# Patient Record
Sex: Female | Born: 1965
Health system: Southern US, Community
[De-identification: ages and names within clinical notes are randomized; demographics above are authoritative.]

## PROBLEM LIST (undated history)

## (undated) DIAGNOSIS — I10 Essential (primary) hypertension: Secondary | ICD-10-CM

## (undated) DIAGNOSIS — M1990 Unspecified osteoarthritis, unspecified site: Secondary | ICD-10-CM

## (undated) DIAGNOSIS — I739 Peripheral vascular disease, unspecified: Secondary | ICD-10-CM

## (undated) DIAGNOSIS — R7303 Prediabetes: Secondary | ICD-10-CM

## (undated) HISTORY — DX: Essential (primary) hypertension: I10

## (undated) HISTORY — PX: OTHER SURGICAL HISTORY: SHX169

## (undated) HISTORY — PX: TUBAL LIGATION: SHX77

## (undated) HISTORY — PX: CHOLECYSTECTOMY: SHX55

---

## 2000-10-04 ENCOUNTER — Emergency Department (HOSPITAL_COMMUNITY): Admission: EM | Admit: 2000-10-04 | Discharge: 2000-10-04 | Payer: Self-pay | Admitting: Emergency Medicine

## 2004-01-02 ENCOUNTER — Ambulatory Visit (HOSPITAL_COMMUNITY): Admission: RE | Admit: 2004-01-02 | Discharge: 2004-01-02 | Payer: Self-pay | Admitting: Family Medicine

## 2004-04-12 ENCOUNTER — Ambulatory Visit (HOSPITAL_COMMUNITY): Admission: RE | Admit: 2004-04-12 | Discharge: 2004-04-12 | Payer: Self-pay | Admitting: Orthopaedic Surgery

## 2005-01-20 ENCOUNTER — Ambulatory Visit: Payer: Self-pay | Admitting: Family Medicine

## 2005-08-22 ENCOUNTER — Ambulatory Visit: Payer: Self-pay | Admitting: Family Medicine

## 2005-09-10 ENCOUNTER — Ambulatory Visit (HOSPITAL_COMMUNITY): Admission: RE | Admit: 2005-09-10 | Discharge: 2005-09-10 | Payer: Self-pay | Admitting: Family Medicine

## 2005-09-29 ENCOUNTER — Other Ambulatory Visit: Admission: RE | Admit: 2005-09-29 | Discharge: 2005-09-29 | Payer: Self-pay | Admitting: Family Medicine

## 2005-09-29 ENCOUNTER — Ambulatory Visit: Payer: Self-pay | Admitting: Family Medicine

## 2005-09-29 LAB — CONVERTED CEMR LAB: Pap Smear: NORMAL

## 2005-10-08 ENCOUNTER — Ambulatory Visit (HOSPITAL_COMMUNITY): Admission: RE | Admit: 2005-10-08 | Discharge: 2005-10-08 | Payer: Self-pay | Admitting: Family Medicine

## 2006-05-11 ENCOUNTER — Ambulatory Visit: Payer: Self-pay | Admitting: Family Medicine

## 2006-06-23 ENCOUNTER — Ambulatory Visit: Payer: Self-pay | Admitting: Family Medicine

## 2006-07-15 ENCOUNTER — Ambulatory Visit (HOSPITAL_COMMUNITY): Admission: RE | Admit: 2006-07-15 | Discharge: 2006-07-15 | Payer: Self-pay | Admitting: Family Medicine

## 2007-02-22 ENCOUNTER — Emergency Department (HOSPITAL_COMMUNITY): Admission: EM | Admit: 2007-02-22 | Discharge: 2007-02-22 | Payer: Self-pay | Admitting: Emergency Medicine

## 2007-04-21 ENCOUNTER — Ambulatory Visit: Payer: Self-pay | Admitting: Family Medicine

## 2007-08-10 ENCOUNTER — Ambulatory Visit (HOSPITAL_COMMUNITY): Admission: RE | Admit: 2007-08-10 | Discharge: 2007-08-10 | Payer: Self-pay | Admitting: Family Medicine

## 2007-08-10 ENCOUNTER — Ambulatory Visit: Payer: Self-pay | Admitting: Vascular Surgery

## 2007-08-23 ENCOUNTER — Ambulatory Visit: Payer: Self-pay | Admitting: Vascular Surgery

## 2007-09-01 ENCOUNTER — Ambulatory Visit: Payer: Self-pay | Admitting: Vascular Surgery

## 2007-10-04 ENCOUNTER — Ambulatory Visit: Payer: Self-pay | Admitting: Vascular Surgery

## 2007-10-12 ENCOUNTER — Ambulatory Visit: Payer: Self-pay | Admitting: Vascular Surgery

## 2007-11-12 ENCOUNTER — Ambulatory Visit: Payer: Self-pay | Admitting: Family Medicine

## 2007-11-29 ENCOUNTER — Encounter: Payer: Self-pay | Admitting: Family Medicine

## 2007-11-29 LAB — CONVERTED CEMR LAB
BUN: 11 mg/dL (ref 6–23)
Basophils Absolute: 0 10*3/uL (ref 0.0–0.1)
Basophils Relative: 0 % (ref 0–1)
CO2: 22 meq/L (ref 19–32)
Calcium: 9.6 mg/dL (ref 8.4–10.5)
Chloride: 104 meq/L (ref 96–112)
Cholesterol: 187 mg/dL (ref 0–200)
Creatinine, Ser: 0.84 mg/dL (ref 0.40–1.20)
Eosinophils Absolute: 0.2 10*3/uL (ref 0.0–0.7)
Eosinophils Relative: 4 % (ref 0–5)
Glucose, Bld: 80 mg/dL (ref 70–99)
HCT: 37.4 % (ref 36.0–46.0)
HDL: 57 mg/dL (ref >39)
Hemoglobin: 11.5 g/dL — ABNORMAL LOW (ref 12.0–15.0)
LDL Cholesterol: 116 mg/dL — ABNORMAL HIGH (ref 0–99)
Lymphocytes Relative: 38 % (ref 12–46)
Lymphs Abs: 1.9 10*3/uL (ref 0.7–4.0)
MCHC: 30.7 g/dL (ref 30.0–36.0)
MCV: 87.4 fL (ref 78.0–100.0)
Monocytes Absolute: 0.3 10*3/uL (ref 0.1–1.0)
Monocytes Relative: 7 % (ref 3–12)
Neutro Abs: 2.5 10*3/uL (ref 1.7–7.7)
Neutrophils Relative %: 50 % (ref 43–77)
Platelets: 357 10*3/uL (ref 150–400)
Potassium: 4 meq/L (ref 3.5–5.3)
RBC: 4.28 M/uL (ref 3.87–5.11)
RDW: 14.3 % (ref 11.5–15.5)
Sodium: 138 meq/L (ref 135–145)
TSH: 1.002 microintl units/mL (ref 0.350–5.50)
Total CHOL/HDL Ratio: 3.3
Triglycerides: 72 mg/dL (ref <150)
VLDL: 14 mg/dL (ref 0–40)
WBC: 5 10*3/uL (ref 4.0–10.5)

## 2007-12-02 ENCOUNTER — Ambulatory Visit: Payer: Self-pay | Admitting: Family Medicine

## 2007-12-02 ENCOUNTER — Encounter: Payer: Self-pay | Admitting: Family Medicine

## 2007-12-02 ENCOUNTER — Other Ambulatory Visit: Admission: RE | Admit: 2007-12-02 | Discharge: 2007-12-02 | Payer: Self-pay | Admitting: Family Medicine

## 2007-12-03 ENCOUNTER — Encounter: Payer: Self-pay | Admitting: Family Medicine

## 2007-12-03 LAB — CONVERTED CEMR LAB
Candida species: NEGATIVE
Chlamydia, DNA Probe: NEGATIVE
GC Probe Amp, Genital: NEGATIVE
Gardnerella vaginalis: NEGATIVE
Trichomonal Vaginitis: NEGATIVE

## 2007-12-06 ENCOUNTER — Ambulatory Visit (HOSPITAL_COMMUNITY): Admission: RE | Admit: 2007-12-06 | Discharge: 2007-12-06 | Payer: Self-pay | Admitting: Family Medicine

## 2007-12-08 ENCOUNTER — Encounter: Payer: Self-pay | Admitting: Family Medicine

## 2007-12-08 LAB — CONVERTED CEMR LAB: Pap Smear: NORMAL

## 2008-02-01 ENCOUNTER — Encounter: Payer: Self-pay | Admitting: Family Medicine

## 2008-02-01 DIAGNOSIS — I10 Essential (primary) hypertension: Secondary | ICD-10-CM | POA: Insufficient documentation

## 2008-02-01 DIAGNOSIS — L259 Unspecified contact dermatitis, unspecified cause: Secondary | ICD-10-CM | POA: Insufficient documentation

## 2008-03-29 ENCOUNTER — Ambulatory Visit: Payer: Self-pay | Admitting: Family Medicine

## 2008-03-31 ENCOUNTER — Encounter: Payer: Self-pay | Admitting: Family Medicine

## 2008-06-29 ENCOUNTER — Telehealth: Payer: Self-pay | Admitting: Family Medicine

## 2008-07-03 ENCOUNTER — Ambulatory Visit: Payer: Self-pay | Admitting: Family Medicine

## 2008-11-21 ENCOUNTER — Ambulatory Visit: Payer: Self-pay | Admitting: Family Medicine

## 2008-11-21 DIAGNOSIS — J309 Allergic rhinitis, unspecified: Secondary | ICD-10-CM | POA: Insufficient documentation

## 2008-11-21 DIAGNOSIS — J029 Acute pharyngitis, unspecified: Secondary | ICD-10-CM | POA: Insufficient documentation

## 2009-07-14 ENCOUNTER — Encounter (INDEPENDENT_AMBULATORY_CARE_PROVIDER_SITE_OTHER): Payer: Self-pay | Admitting: *Deleted

## 2009-08-02 ENCOUNTER — Encounter: Payer: Self-pay | Admitting: Physician Assistant

## 2009-08-02 ENCOUNTER — Ambulatory Visit: Payer: Self-pay | Admitting: Family Medicine

## 2009-08-02 DIAGNOSIS — D1800 Hemangioma unspecified site: Secondary | ICD-10-CM | POA: Insufficient documentation

## 2009-08-02 DIAGNOSIS — M179 Osteoarthritis of knee, unspecified: Secondary | ICD-10-CM | POA: Insufficient documentation

## 2009-08-02 DIAGNOSIS — M1710 Unilateral primary osteoarthritis, unspecified knee: Secondary | ICD-10-CM | POA: Insufficient documentation

## 2009-08-02 DIAGNOSIS — D239 Other benign neoplasm of skin, unspecified: Secondary | ICD-10-CM | POA: Insufficient documentation

## 2009-08-03 ENCOUNTER — Encounter: Payer: Self-pay | Admitting: Physician Assistant

## 2009-08-03 LAB — CONVERTED CEMR LAB
BUN: 11 mg/dL (ref 6–23)
CO2: 29 meq/L (ref 19–32)
Calcium: 9.5 mg/dL (ref 8.4–10.5)
Chloride: 100 meq/L (ref 96–112)
Cholesterol: 172 mg/dL (ref 0–200)
Creatinine, Ser: 0.75 mg/dL (ref 0.40–1.20)
Glucose, Bld: 86 mg/dL (ref 70–99)
HCT: 39 % (ref 36.0–46.0)
HDL: 59 mg/dL (ref >39)
Hemoglobin: 12.3 g/dL (ref 12.0–15.0)
LDL Cholesterol: 102 mg/dL — ABNORMAL HIGH (ref 0–99)
MCHC: 31.5 g/dL (ref 30.0–36.0)
MCV: 90.5 fL (ref 78.0–100.0)
Platelets: 337 10*3/uL (ref 150–400)
Potassium: 4.2 meq/L (ref 3.5–5.3)
RBC: 4.31 M/uL (ref 3.87–5.11)
RDW: 14 % (ref 11.5–15.5)
Sodium: 137 meq/L (ref 135–145)
TSH: 1.036 microintl units/mL (ref 0.350–4.500)
Total CHOL/HDL Ratio: 2.9
Triglycerides: 56 mg/dL (ref <150)
VLDL: 11 mg/dL (ref 0–40)
Vit D, 25-Hydroxy: 26 ng/mL — ABNORMAL LOW (ref 30–89)
WBC: 4.5 10*3/uL (ref 4.0–10.5)

## 2009-09-26 ENCOUNTER — Ambulatory Visit: Payer: Self-pay | Admitting: Family Medicine

## 2009-09-28 ENCOUNTER — Encounter: Payer: Self-pay | Admitting: Family Medicine

## 2009-10-04 ENCOUNTER — Ambulatory Visit: Payer: Self-pay | Admitting: Family Medicine

## 2009-10-04 ENCOUNTER — Other Ambulatory Visit: Admission: RE | Admit: 2009-10-04 | Discharge: 2009-10-04 | Payer: Self-pay | Admitting: Family Medicine

## 2009-10-04 DIAGNOSIS — R109 Unspecified abdominal pain: Secondary | ICD-10-CM | POA: Insufficient documentation

## 2009-10-04 LAB — CONVERTED CEMR LAB
OCCULT 1: NEGATIVE
Pap Smear: NEGATIVE

## 2009-10-15 ENCOUNTER — Ambulatory Visit: Payer: Self-pay | Admitting: Family Medicine

## 2009-10-15 DIAGNOSIS — N920 Excessive and frequent menstruation with regular cycle: Secondary | ICD-10-CM | POA: Insufficient documentation

## 2009-10-15 LAB — CONVERTED CEMR LAB
Beta hcg, urine, semiquantitative: NEGATIVE
Bilirubin Urine: NEGATIVE
Blood in Urine, dipstick: NEGATIVE
Glucose, Urine, Semiquant: NEGATIVE
Ketones, urine, test strip: NEGATIVE
Nitrite: NEGATIVE
Protein, U semiquant: NEGATIVE
Specific Gravity, Urine: 1.02
Urobilinogen, UA: 0.2
WBC Urine, dipstick: NEGATIVE
pH: 7

## 2010-03-27 ENCOUNTER — Ambulatory Visit: Payer: Self-pay | Admitting: Family Medicine

## 2010-03-27 DIAGNOSIS — J019 Acute sinusitis, unspecified: Secondary | ICD-10-CM | POA: Insufficient documentation

## 2010-03-27 DIAGNOSIS — J018 Other acute sinusitis: Secondary | ICD-10-CM | POA: Insufficient documentation

## 2010-03-29 ENCOUNTER — Encounter: Payer: Self-pay | Admitting: Family Medicine

## 2010-03-29 ENCOUNTER — Telehealth: Payer: Self-pay | Admitting: Family Medicine

## 2010-04-08 DIAGNOSIS — J209 Acute bronchitis, unspecified: Secondary | ICD-10-CM | POA: Insufficient documentation

## 2010-06-23 ENCOUNTER — Encounter: Payer: Self-pay | Admitting: Family Medicine

## 2010-07-02 NOTE — Assessment & Plan Note (Signed)
Summary: TB  Nurse Visit   Vital Signs:  Patient profile:   45 year old female Menstrual status:  regular Height:      65.5 inches Weight:      291 pounds BMI:     47.86 O2 Sat:      98 % Pulse rate:   100 / minute Resp:     16 per minute  Vitals Entered By: Everitt Amber LPN (September 26, 2009 11:38 AM) CC: Patient here to have TB injection    Allergies: No Known Drug Allergies  Orders Added: 1)  TB Skin Test [86580] 2)  Admin 1st Vaccine [90471] 3)  Admin 1st Vaccine (State) [90471S]   PPD Application    Vaccine Type: PPD    Site: right forearm    Mfr: [employer] Pasteur    Dose: 0.1 ml    Route: ID    Given by: Everitt Amber LPN    Exp. Date: 03/2011    Lot #: [id number]  noted  Appended Document: TB   PPD Results    Date of reading: 09/28/2009    Results: < 5mm    Interpretation: negative

## 2010-07-02 NOTE — Assessment & Plan Note (Signed)
Summary: follow up - room 3   Vital Signs:  Patient profile:   45 year old female Menstrual status:  regular Height:      65.5 inches Weight:      285.25 pounds BMI:     46.92 O2 Sat:      98 % on Room air Pulse rate:   89 / minute Resp:     16 per minute BP sitting:   126 / 80  (left arm)  Vitals Entered By: Adella Hare LPN (August 02, 1608 9:02 AM)  Nutrition Counseling: Patient's BMI is greater than 25 and therefore counseled on weight management options. CC: follow up Is Patient Diabetic? No Pain Assessment Patient in pain? no        CC:  follow up.  History of Present Illness: Pt is here today with a few concerns.  She would like a referral to Derm for moles &skin discoloration on her LE.  The skin discoloration started after her Varicose vein surgery.  Has faded some.  Surgeon told her there was nothing she could do about it.  She wonders if the area can be cut out.  She also has some new red moles.  And has a mole on her nose that Dr Margo Aye had removed before & it has come back.  Would like it removeda again.  pt also c/o her knees hurting with walking & bulge Rt knee. No trauma.  She also has some grinding noises with walking.  The pain is only when she walks for exercise & not with walking with daily activity.  No swelling.  She has seen Dr Milana Na in the past for her knees.  Rf BP meds.  not taking K+ because they are too large.  No probs with BP pills.  Her BP has been well controlled. Pt would like to sched physical & pap in the near future.  she is currently unemployed & wants to get it done before she returns to work.  Current Medications (verified): 1)  Lisinopril-Hydrochlorothiazide 20-25 Mg Tabs (Lisinopril-Hydrochlorothiazide) .... One and Half Tabs By Mouth Qd 2)  Phentermine Hcl 37.5 Mg Tabs (Phentermine Hcl) .... One Tab By Mouth Once Daily 3)  Penicillin V Potassium 500 Mg Tabs (Penicillin V Potassium) .... Take 1 Tablet By Mouth Three Times A  Day  Allergies (verified): No Known Drug Allergies  Past History:  Past medical, surgical, family and social histories (including risk factors) reviewed for relevance to current acute and chronic problems.  Past Medical History: Reviewed history from 02/01/2008 and no changes required. * Sx of LEFT INGUINAL STRAIN DERMATITIS (ICD-692.9) OBESITY (ICD-278.00) HYPERTENSION (ICD-401.9)  Past Surgical History: Reviewed history from 02/01/2008 and no changes required. Cholecystectomy (9604) Caesarean section (1989) Caesarean section (1996) Tubal ligation (1996) Vascular surgery left and right leg (2009)  Family History: Reviewed history from 02/01/2008 and no changes required. TWO CHILDREN MOTHER  LIVING  HEALTHY FATHER  DECEASED  CANCER AND GI TRACT FOUR SIBLINGS  LIVING  THREE ARE HYPERTENSIVE ONE BROTHER DECEASED  METASTATIC PANCREATIC CANCER  Social History: Reviewed history from 03/29/2008 and no changes required. Employed Married Never Smoked Alcohol use-no Drug use-no  Review of Systems General:  Denies chills and fever. CV:  Denies chest pain or discomfort, shortness of breath with exertion, and swelling of feet. Resp:  Denies cough and shortness of breath. MS:  Complains of joint pain; denies joint redness, joint swelling, low back pain, and cramps. Neuro:  Denies numbness and tingling.  Physical Exam  General:  Well-developed,well-nourished,in no acute distress; alert,appropriate and cooperative throughout examination Head:  Normocephalic and atraumatic without obvious abnormalities. No apparent alopecia or balding. Lungs:  Normal respiratory effort, chest expands symmetrically. Lungs are clear to auscultation, no crackles or wheezes. Heart:  Normal rate and regular rhythm. S1 and S2 normal without gallop, murmur, click, rub or other extra sounds. Msk:  Bilat knees -no joint tenderness, no joint swelling, and no joint warmth.  No ligament instabilty.  Pt dos  have some mild crepitus bilat patella.  Rt anterior knee, medial & inferior to patella there is a more prominent nodule compared to the Lt.  this feels like it may be a fat pad. Pulses:  R posterior tibial normal, R dorsalis pedis normal, L posterior tibial normal, and L dorsalis pedis normal.   Extremities:  No clubbing, cyanosis, edema, or deformity noted with normal full range of motion of all joints.   Neurologic:  alert & oriented X3, strength normal in all extremities, sensation intact to light touch, and gait normal.   Skin:  Intact without suspicious lesions or rashes.  Nevus noted on nose approx 2-3 mm.  Hemangiomas noted on thorax.  Hyperpigmentation bilat medial ankle areas also note. no rashes, no ecchymoses, and no ulcerations.   Psych:  Cognition and judgment appear intact. Alert and cooperative with normal attention span and concentration. No apparent delusions, illusions, hallucinations   Impression & Recommendations:  Problem # 1:  HYPERTENSION (ICD-401.9) Assessment Unchanged  Her updated medication list for this problem includes:    Lisinopril-hydrochlorothiazide 20-25 Mg Tabs (Lisinopril-hydrochlorothiazide) ..... One and half tabs by mouth qd  BP today: 126/80 Prior BP: 118/76 (11/21/2008)  Labs Reviewed: K+: 4.0 (11/29/2007) Creat: : 0.84 (11/29/2007)   Chol: 187 (11/29/2007)   HDL: 57 (11/29/2007)   LDL: 116 (11/29/2007)   TG: 72 (11/29/2007)  Orders: T-Basic Metabolic Panel [phone]) T-CBC No Diff ([id number])  Problem # 2:  KNEE PAIN, BILATERAL (ICD-719.46) Assessment: New Pt started Glucosamine & chrondroitin. She feels like this is helping some.  Advised she may cont.  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  discussed that these are common with age & are  benign.  Problem # 5:  OBESITY (ICD-278.00) Assessment: Deteriorated Pt requested refill phenteramine. Has taken prev.  Will auth 1 mos at this time.   Orders: T-Lipid Profile ([id number]) T-TSH [phone])  Ht: 65.5 (08/02/2009)   Wt: 285.25 (08/02/2009)   BMI: 46.92 (08/02/2009)  Complete Medication List: 1)  Lisinopril-hydrochlorothiazide 20-25 Mg Tabs (Lisinopril-hydrochlorothiazide) .... One and half tabs by mouth qd 2)  Phentermine Hcl 37.5 Mg Tabs (Phentermine hcl) .... One tab by mouth once daily 3)  Penicillin V Potassium 500 Mg Tabs (Penicillin v potassium) .... Take 1 tablet by mouth three times a day  Other Orders: T-Vitamin D (25-Hydroxy) ([id number]) Mammogram (Screening) (Mammo)  Patient Instructions: 1)  Schedule a physical/pap appt with Dr Lodema Hong in 1-2 mos. 2)  I have referred you to the Dermatologist & Orthopedic Dr as discussed. 3)  I have ordered blood work work.  Please have done fasting. 4)  I have ordered a mammogram. 5)  It is important that you exercise regularly at least 20 minutes 5 times a week. If you develop chest pain, have severe difficulty breathing, or feel very tired , stop exercising immediately and seek medical attention. 6)  You need to lose weight. Consider a lower calorie diet and regular exercise.  Prescriptions: PHENTERMINE HCL 37.5 MG TABS (PHENTERMINE HCL) One tab by mouth once daily  #30 x 0   Entered and Authorized by:   Esperanza Sheets PA   Signed by:   Esperanza Sheets PA on 08/02/2009   Method used:   Printed then faxed to ...       [facility] (retail)       [address]       [place], [place]  [address]       Ph: [phone]       Fax: [phone]   RxID:   [id number] LISINOPRIL-HYDROCHLOROTHIAZIDE 20-25 MG TABS (LISINOPRIL-HYDROCHLOROTHIAZIDE) one and half tabs by mouth qd  #60 Each x 5   Entered and Authorized by:   Esperanza Sheets PA   Signed by:   Esperanza Sheets PA on 08/02/2009   Method used:    Printed then faxed to ...       [facility] (retail)       [address]       [place], [place]  [address]       Ph: [phone]       Fax: [phone]   RxID:   [id number]

## 2010-07-02 NOTE — Letter (Signed)
Summary: Laboratory/X-Ray Results  [facility]  [address]   [place], [place] [address]   Phone: [phone]  Fax: [phone]    Lab/X-Ray Results  August 03, 2009  MRN: [id number]  LUE SYKORA [address] [place], [place]  [address]    The results of your recent lab/x-ray has been reviewed and were found:       Lab work is normal, except your Vitamin D level is a little low.  Please take Vitamin D       1,000 international units once daily. You can purchase this in the Vitamin section of any store.    If you have any questions, please contact our office.     Esperanza Sheets PA

## 2010-07-02 NOTE — Letter (Signed)
Summary: Out of Work  [facility]  [address]   [place], [place] [address]   Phone: [phone]  Fax: [phone]    Oct 15, 2009   Employee:  AMAMDA CURBOW    To Whom It May Concern:   For Medical reasons, please excuse the above named employee from work for the following dates:  Start:   10/15/09  End:   10/16/09 to return with no restrictions  If you need additional information, please feel free to contact our office.         Sincerely,    Esperanza Sheets, [place]

## 2010-07-02 NOTE — Letter (Signed)
Summary: TB Skin Test  [facility]  [address]   [place], [place] [address]   Phone: [phone]  Fax: [phone]          TB Skin Test    Suzanne Andrews DOB: Mar 29, 2066  Vaccine Type: PPD    Site: right forearm    Mfr: [employer] Pasteur    Dose: 0.1 ml    Route: ID    Given by: Everitt Amber LPN    Exp. Date: 03/2011    Lot #: [id number] Read: 0mm, Negative Jaime Boothe LPN  September 28, 2009 11:12 AM

## 2010-07-02 NOTE — Assessment & Plan Note (Signed)
Summary: physical   Vital Signs:  Patient profile:   45 year old female Menstrual status:  regular LMP:     09/11/2009 Height:      65.5 inches Weight:      289.25 pounds BMI:     47.57 O2 Sat:      94 % Pulse rate:   73 / minute Pulse rhythm:   regular Resp:     16 per minute BP sitting:   124 / 80  (left arm) Cuff size:   large  Vitals Entered By: Everitt Amber LPN (Oct 05, 8655 8:27 AM)  Nutrition Counseling: Patient's BMI is greater than 25 and therefore counseled on weight management options. CC: CPE  Vision Screening:Left eye w/o correction: 20 / 25 Right Eye w/o correction: 20 / 40 Both eyes w/o correction:  20/ 25  Color vision testing: normal     Vision Comments: Forgot glasses. Right eye is bad eye  Vision Entered By: Everitt Amber LPN (Oct 05, 8467 8:32 AM) LMP (date): 09/11/2009     Enter LMP: 09/11/2009 Last PAP Result normal   CC:  CPE.  History of Present Illness: Reports  thatshe has been doing fairly well. She has rsumed phentermine one daily and has noted some weight loss, she intends to xcommit to regular exercise also.  Denies recent fever or chills. Denies sinus pressure, nasal congestion , ear pain or sore throat. Denies chest congestion, or cough productive of sputum. Denies chest pain, palpitations, PND, orthopnea or leg swelling. Denies abdominal pain, nausea, vomitting, diarrhea or constipation. Denies change in bowel movements or bloody stool. Denies dysuria , frequency, incontinence or hesitancy. Denies  joint pain,  or reduced mobility.She does have swelling over right anterior knee, and has already had a redferral request put in for this.  Denies headaches, vertigo, seizures. Denies depression, anxiety or insomnia. She c/o hyperpigmentation over the lower extremeties aswell as erythematous round lesions on the chest  and arms tha t are painless and are popping up, as well as changesin skin pigmentation, an appt with derm will be  arranged, though I have assured her theseare all benign and are not treatable/correctable though dermabrasion may be of value.       Current Medications (verified): 1)  Lisinopril-Hydrochlorothiazide 20-25 Mg Tabs (Lisinopril-Hydrochlorothiazide) .... One and Half Tabs By Mouth Qd 2)  Phentermine Hcl 37.5 Mg Tabs (Phentermine Hcl) .... One Tab By Mouth Once Daily  Allergies (verified): No Known Drug Allergies  Review of Systems      See HPI General:  Complains of fatigue. Eyes:  Complains of vision loss-both eyes; denies discharge; wears corrective lenses. Derm:  Complains of lesion(s) and rash. Heme:  Denies abnormal bruising and bleeding. Allergy:  Complains of seasonal allergies.  Physical Exam  General:  Well-developed,morbidly obese,in no acute distress; alert,appropriate and cooperative throughout examination Head:  Normocephalic and atraumatic without obvious abnormalities. No apparent alopecia or balding. Eyes:  No corneal or conjunctival inflammation noted. EOMI. Perrla. Funduscopic exam benign, without hemorrhages, exudates or papilledema. Vision grossly normal. Ears:  External ear exam shows no significant lesions or deformities.  Otoscopic examination reveals clear canals, tympanic membranes are intact bilaterally without bulging, retraction, inflammation or discharge. Hearing is grossly normal bilaterally. Nose:  External nasal examination shows no deformity or inflammation. Nasal mucosa are pink and moist without lesions or exudates. Mouth:  Oral mucosa and oropharynx without lesions or exudates.  Teeth in good repair. Neck:  No deformities, masses, or tenderness noted.  Chest Wall:  No deformities, masses, or tenderness noted. Breasts:  No mass, nodules, thickening, tenderness, bulging, retraction, inflamation, nipple discharge or skin changes noted.   Lungs:  Normal respiratory effort, chest expands symmetrically. Lungs are clear to auscultation, no crackles or  wheezes. Heart:  Normal rate and regular rhythm. S1 and S2 normal without gallop, murmur, click, rub or other extra sounds. Abdomen:  Bowel sounds positive,abdomen soft and non-tender without masses, organomegaly or hernias noted. Rectal:  No external abnormalities noted. Normal sphincter tone. No rectal masses or tenderness.guaic negative stool Genitalia:  Normal introitus for age, no external lesions, no vaginal discharge, mucosa pink and moist, no vaginal or cervical lesions, no vaginal atrophy, no friaility or hemorrhage, normal uterus size and position, no adnexal masses , left admexal  tenderness Msk:  No deformity or scoliosis noted of thoracic or lumbar spine.   Pulses:  R and L carotid,radial,femoral,dorsalis pedis and posterior tibial pulses are full and equal bilaterally Extremities:  No clubbing, cyanosis, edema, or deformity noted with normal full range of motion of all joints.   Neurologic:  No cranial nerve deficits noted. Station and gait are normal. Plantar reflexes are down-going bilaterally. DTRs are symmetrical throughout. Sensory, motor and coordinative functions appear intact. Skin:  hyperpigmented macular rash on lower extremeties, erythematous lesions on chest and upper limbs Cervical Nodes:  No lymphadenopathy noted Axillary Nodes:  No palpable lymphadenopathy Inguinal Nodes:  No significant adenopathy Psych:  Cognition and judgment appear intact. Alert and cooperative with normal attention span and concentration. 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 One and half tabs by mouth qd  BP today: 124/80 Prior BP: 126/80 (08/02/2009)  Labs Reviewed: K+: 4.2 (08/02/2009) Creat: : 0.75 (08/02/2009)   Chol: 172 (08/02/2009)   HDL: 59 (08/02/2009)   LDL: 102 (08/02/2009)   TG: 56 (08/02/2009)  Problem # 4:  OBESITY (ICD-278.00) Assessment: Improved  Ht: 65.5 (10/04/2009)   Wt: 289.25 (10/04/2009)   BMI: 47.57 (10/04/2009)  Complete Medication List: 1)  Lisinopril-hydrochlorothiazide 20-25 Mg Tabs (Lisinopril-hydrochlorothiazide) .... One and half tabs by mouth qd 2)  Phentermine Hcl 37.5 Mg Tabs (Phentermine hcl) .... One tab by mouth once daily  Other Orders: Pap Smear ([address]) Hemoccult Guaiac-1 spec.(in office) ([address])  Patient Instructions: 1)  Please schedule a follow-up appointment in 2 months. 2)  It is important that you exercise regularly at least 40 minutes 5 times a week. If you develop chest pain, have severe difficulty breathing, or feel very tired , stop exercising immediately and seek medical attention. 3)  You need to lose weight. Consider a lower calorie diet and regular exercise.  4)  you will be referred for a mamogram, to see ortho about your right knee swelling, you will be referred for a pelvic ultrasound.You will be referred to see Dr Margo Aye. 5)  iT IS VITAL you cALLL BACK and let us know your available dates 6)  pls follow  LOW FAT DIET RICH IN FRUIT, VEG WHITE MEAT AND WATR, TOURCHOLESTEROL (BAD) IS A BIT HIGH  Prescriptions: PHENTERMINE HCL 37.5 MG TABS (PHENTERMINE HCL) One tab by mouth once daily  #  30 x 1   Entered by:   Everitt Amber LPN   Authorized by:   Syliva Overman MD   Signed by:   Everitt Amber LPN on 62/13/0865   Method used:   Printed then faxed to ...       [facility] ([facility]ail)       [address]       [place], [place]  [address]       Ph: [phone]       Fax: [phone]   RxID:    [id number]   Laboratory Results    Stool - Occult Blood Hemmoccult #1: negative Date: 10/04/2009 Comments: [id number] 9r 8/11 118 1012

## 2010-07-02 NOTE — Letter (Signed)
Summary: 1st Missed Appt.  [facility]  [address]   [place], [place] [address]   Phone: [phone]  Fax: [phone]    July 14, 2009  MRN: [id number]  JALYSA SWOPES [address] [place], [place]  [address]  Dear Ms. Laural Benes,  At [facility], we make every attempt to fit patients into our schedule by reserving several appointment slots for same-day appointments.  However, we cannot always make appointments for patients the same day they are calling.  At the end of the day, we look back at our schedule and find that because of last-minute cancellations and patients not showing up for their scheduled appointments, we have several appointment slots that are left open and could have been used by another person who really needed it.  In the past, you may have been one of the patients who could not get in when you needed to.  But recently, you were one of the patients with an appointment that you didn't show up for or canceled too late for [place] to fill it.  We choose not to charge no-show or last minute cancellation fees to our patients, like many other offices do.  We do not wish to institute that policy and hope we never have to.  However, we kindly request that you assist [place] by providing at least 24 hours' notice if you can't make your appointment.  If no-shows or late cancellations become habitual (i.e. Three or more in a one-year period), we may terminate the physician-patient relationship.    Thank you for your consideration and cooperation.   Altamease Oiler

## 2010-07-02 NOTE — Assessment & Plan Note (Signed)
Summary: pain- room 3   Vital Signs:  Patient profile:   45 year old female Menstrual status:  regular Height:      65.5 inches Weight:      289.25 pounds BMI:     47.57 O2 Sat:      100 % on Room air Pulse rate:   86 / minute Resp:     16 per minute BP sitting:   110 / 70  (left arm)  Vitals Entered By: Adella Hare LPN (Oct 15, 2009 1:46 PM) CC: left sided pelvic pain Is Patient Diabetic? No Pain Assessment      Location: head Comments patient did not bring meds to ov states she has told dr Lodema Hong about this pain that seems to occur around her menstral, states it is mild but constant   CC:  left sided pelvic pain.  History of Present Illness: Pt is here today with c/o Lt pelvic pain.  She has been getting this 1-2 weeks before her menstrual period  x approx 2 mos.  This is usually a mild dull achey pain, but awoke her at 2 am today with sharp pain.  She took some Naproxen this am & no help.  Denies chance of pregnancy.  No vag dischg.  BM's have been nl.  No dysuria or frequency.  No vag dischg.  Her menses are regular.  Heavy flow the 1st & 2nd day.  Has to change protection the 2nd day every hr, and she dbles up her pads.  She is due to start her period now.  Dr Lodema Hong ordered a PUS for pt, but she waited to have this scheduled due to change in jobs.  She states she can now have it scheduled.   Allergies (verified): No Known Drug Allergies  Past History:  Past medical history reviewed for relevance to current acute and chronic problems.  Past Medical History: Reviewed history from 02/01/2008 and no changes required. * Sx of LEFT INGUINAL STRAIN DERMATITIS (ICD-692.9) OBESITY (ICD-278.00) HYPERTENSION (ICD-401.9)  Review of Systems General:  Denies chills and fever. GI:  Complains of abdominal pain; denies bloody stools, change in bowel habits, constipation, dark tarry stools, and diarrhea. GU:  Denies abnormal vaginal bleeding, discharge, dysuria,  hematuria, and urinary frequency.  Physical Exam  General:  Well-developed,well-nourished,in no acute distress; alert,appropriate and cooperative throughout examination Head:  Normocephalic and atraumatic without obvious abnormalities. No apparent alopecia or balding. Abdomen:  soft, no masses, no guarding, and no rigidity.  Pt does report TTP in suprapubic region. And with palp in RLQ states this causes pain in LLQ. Genitalia:  normal introitus, no external lesions, and normal uterus size and position, neg CMT.  Rt adnexa nontender.  Lt is mildly tender, without palp mass. Psych:  Cognition and judgment appear intact. Alert and cooperative with normal attention span and concentration. No apparent delusions, illusions, hallucinations   Impression & Recommendations:  Problem # 1:  PELVIC PAIN, CHRONIC (ICD-789.09) Assessment Deteriorated  Her updated medication list for this problem includes:    Tramadol Hcl 50 Mg Tabs (Tramadol hcl) .Marland Kitchen... Take 1-2 every 6 hrs as needed for pain  Orders: Urinalysis (22025-42706) Urine Pregnancy Test  (23762)  Problem # 2:  MENORRHAGIA (ICD-626.2) Assessment: Unchanged Discussed GYN consult with pt.  Discussed treatment options may include Mirena IUD,or ablation.  Pt wishes to wait at this time.  Orders: Urine Pregnancy Test  (83151)  Complete Medication List: 1)  Lisinopril-hydrochlorothiazide 20-25 Mg Tabs (Lisinopril-hydrochlorothiazide) .... One and half  tabs by mouth qd 2)  Phentermine Hcl 37.5 Mg Tabs (Phentermine hcl) .... One tab by mouth once daily 3)  Tramadol Hcl 50 Mg Tabs (Tramadol hcl) .... Take 1-2 every 6 hrs as needed for pain  Patient Instructions: 1)  Please schedule a follow-up appointment as needed. 2)  We will schedule the pelvic US as ordered previusly by Dr Lodema Hong. 3)  I have prescribed Tramadol for pain. 4)  If you decide that you'd like to see a gynecologist about your heavy menstrual bleeding please let us know and we  will refer you. Prescriptions: TRAMADOL HCL 50 MG TABS (TRAMADOL HCL) take 1-2 every 6 hrs as needed for pain  #40 x 0   Entered and Authorized by:   Esperanza Sheets PA   Signed by:   Esperanza Sheets PA on 10/15/2009   Method used:   Electronically to        [facility]  [address] (retail)       [address]       [place], [place]  [address]       Ph: [phone]       Fax: [phone]   RxID:   [id number]   Laboratory Results   Urine Tests  Date/Time Received: Oct 15, 2009 2:46 PM  Date/Time Reported: Oct 15, 2009 2:46 PM   Routine Urinalysis   Color: yellow Appearance: Clear Glucose: negative   (Normal Range: Negative) Bilirubin: negative   (Normal Range: Negative) Ketone: negative   (Normal Range: Negative) Spec. Gravity: 1.020   (Normal Range: 1.003-1.035) Blood: negative   (Normal Range: Negative) pH: 7.0   (Normal Range: 5.0-8.0) Protein: negative   (Normal Range: Negative) Urobilinogen: 0.2   (Normal Range: 0-1) Nitrite: negative   (Normal Range: Negative) Leukocyte Esterace: negative   (Normal Range: Negative)    Urine HCG: negative

## 2010-07-02 NOTE — Letter (Signed)
Summary: Out of Work  [facility]  [address]   [place], [place] [address]   Phone: [phone]  Fax: [phone]    March 29, 2010   Employee:  DANELL VAZQUEZ    To Whom It May Concern:   For Medical reasons, please excuse the above named employee from work for the following dates:  Start:   03/27/10        End:   04/01/10 to return with no restrictions  If you need additional information, please feel free to contact our office.         Sincerely,    Milus Mallick. Lodema Hong, MD

## 2010-07-02 NOTE — Progress Notes (Signed)
Summary: WORK NOTE  Phone Note Call from Patient   Summary of Call: HAS A NOTE TO GO BACK TO WORK TODAY BUT SHE DOES NOT FEEL TO [facility] BETTER WANTS TO KNOW WILL Brynda Greathouse BACK AT [phone] Initial call taken by: Lind Guest,  March 29, 2010 8:48 AM  Follow-up for Phone Call        ok to keep her out till 10/31, pls prepare I will sign Follow-up by: Syliva Overman MD,  March 29, 2010 12:03 PM  Additional Follow-up for Phone Call Additional follow up Details #1::        note printed and stamped Additional Follow-up by: Adella Hare LPN,  March 29, 2010 2:17 PM

## 2010-07-02 NOTE — Assessment & Plan Note (Signed)
Summary: ov   Vital Signs:  Patient profile:   45 year old female Menstrual status:  regular Height:      65.5 inches Weight:      278.25 pounds BMI:     45.76 O2 Sat:      98 % on Room air Pulse rate:   80 / minute Pulse rhythm:   regular Resp:     16 per minute BP sitting:   118 / 80  (left arm)  Vitals Entered By: Mauricia Area CMA (March 27, 2010 3:52 PM)  Nutrition Counseling: Patient's BMI is greater than 25 and therefore counseled on weight management options.  O2 Flow:  Room air CC: Sore throat, congestion, sinus pressure, nasal drainage, thick white. loss of appetite, and cough since Monday   CC:  Sore throat, congestion, sinus pressure, nasal drainage, thick white. loss of appetite, and and cough since Monday.  History of Present Illness: 2 day h/o head and chest congestion chills and aches, undocumented fever, sore throat, and cough productive of yellow sputum, also yellow nasal drainage. Reports  thatprior to this she has been doing well. she has also bee n doing extemely well with weight loss Denies chest pain, palpitations, PND, orthopnea or leg swelling. Denies abdominal pain, nausea, vomitting, diarrhea or constipation. Denies change in bowel movements or bloody stool. Denies dysuria , frequency, incontinence or hesitancy. Denies  joint pain, swelling, or reduced mobility. Denies headaches, vertigo, seizures. Denies depression, anxiety or insomnia. Denies  rash, lesions, or itch.     Current Medications (verified): 1)  Lisinopril-Hydrochlorothiazide 20-25 Mg Tabs (Lisinopril-Hydrochlorothiazide) .... One and Half Tabs By Mouth Once Daily. 2)  Phentermine Hcl 37.5 Mg Tabs (Phentermine Hcl) .... One Tab By Mouth Once Daily  Allergies (verified): No Known Drug Allergies  Review of Systems      See HPI Eyes:  Denies blurring and discharge. CV:  Denies chest pain or discomfort, palpitations, and swelling of feet. GI:  Denies abdominal pain,  constipation, diarrhea, nausea, and vomiting. GU:  Denies dysuria and urinary frequency. MS:  Denies joint pain and stiffness. Endo:  Denies cold intolerance, excessive thirst, excessive urination, and heat intolerance. Heme:  Denies abnormal bruising and bleeding. Allergy:  Complains of seasonal allergies.  Physical Exam  General:  Well-developed,opbese,in no acute distress; alert,appropriate and cooperative throughout examination. Ill appearing. HEENT: No facial asymmetry,  EOMI, frontal and maxillary  sinus tenderness, TM's Clear, oropharynx  pink and moist.   Chest: Decreased air entry, bilateral crakles and wheezes CVS: S1, S2, No murmurs, No S3.   Abd: Soft, Nontender.  MS: Adequate ROM spine, hips, shoulders and knees.  Ext: No edema.   CNS: CN 2-12 intact, power tone and sensation normal throughout.   Skin: Intact, no visible lesions or rashes.  Psych: Good eye contact, normal affect.  Memory intact, not anxious or depressed appearing.    Impression & Recommendations:  Problem # 1:  OTHER ACUTE SINUSITIS (ICD-461.8) Assessment Comment Only  Her updated medication list for this problem includes:    Penicillin V Potassium 500 Mg Tabs (Penicillin v potassium) .Marland Kitchen... Take 1 tablet by mouth three times a day  Orders: Rocephin  250mg  (Z6109) Admin of Therapeutic Inj  intramuscular or subcutaneous (60454)  Problem # 2:  ACUTE BRONCHITIS (ICD-466.0) Assessment: Comment Only  Her updated medication list for this problem includes:    Penicillin V Potassium 500 Mg Tabs (Penicillin v potassium) .Marland Kitchen... Take 1 tablet by mouth three times a day  Problem #  3:  OBESITY (ICD-278.00) Assessment: Improved  Ht: 65.5 (03/27/2010)   Wt: 278.25 (03/27/2010)   BMI: 45.76 (03/27/2010) therapeutic lifestyle change discussed and encouraged  Problem # 4:  HYPERTENSION (ICD-401.9) Assessment: Unchanged  Her updated medication list for this problem includes:     Lisinopril-hydrochlorothiazide 20-25 Mg Tabs (Lisinopril-hydrochlorothiazide) ..... One and half tabs by mouth once daily.  BP today: 118/80 Prior BP: 110/70 (10/15/2009)  Labs Reviewed: K+: 4.2 (08/02/2009) Creat: : 0.75 (08/02/2009)   Chol: 172 (08/02/2009)   HDL: 59 (08/02/2009)   LDL: 102 (08/02/2009)   TG: 56 (08/02/2009)  Complete Medication List: 1)  Lisinopril-hydrochlorothiazide 20-25 Mg Tabs (Lisinopril-hydrochlorothiazide) .... One and half tabs by mouth once daily. 2)  Penicillin V Potassium 500 Mg Tabs (Penicillin v potassium) .... Take 1 tablet by mouth three times a day  Patient Instructions: 1)  Please schedule a follow-up appointment in 4 months. 2)  It is important that you exercise regularly at least 20 minutes 5 times a week. If you develop chest pain, have severe difficulty breathing, or feel very tired , stop exercising immediately and seek medical attention. 3)  You need to lose weight. Consider a lower calorie diet and regular exercise.  4)  You are being trerated for acute sinusitis 5)  Work excuse today to return on 03/29/2010 Prescriptions: PENICILLIN V POTASSIUM 500 MG TABS (PENICILLIN V POTASSIUM) Take 1 tablet by mouth three times a day  #30 x 0   Entered and Authorized by:   Syliva Overman MD   Signed by:   Syliva Overman MD on 03/27/2010   Method used:   Electronically to        [facility]  [address] [address] [facility] (retail)       [address]       [place], [place]  [address]       Ph: [phone]       Fax: [phone]   RxID:   [id number]    Medication Administration  Injection # 1:    Medication: Rocephin  250mg     Diagnosis: OTHER ACUTE SINUSITIS (ICD-461.8)    Route: IM    Site: RUOQ gluteus    Exp Date: 09/2012    Lot #: [id number]    Mfr: novaplus    Comments: 500 mg given    Patient tolerated injection without complications    Given by: Mauricia Area CMA (March 27, 2010 4:37 PM)  Orders Added: 1)  Est. Patient Level IV  [01093] 2)  Rocephin  250mg  [J0696] 3)  Admin of Therapeutic Inj  intramuscular or subcutaneous [23557]

## 2010-07-29 ENCOUNTER — Encounter (INDEPENDENT_AMBULATORY_CARE_PROVIDER_SITE_OTHER): Payer: Self-pay

## 2010-07-29 ENCOUNTER — Ambulatory Visit (INDEPENDENT_AMBULATORY_CARE_PROVIDER_SITE_OTHER): Payer: Self-pay | Admitting: Family Medicine

## 2010-07-29 ENCOUNTER — Encounter: Payer: Self-pay | Admitting: Family Medicine

## 2010-07-29 DIAGNOSIS — J018 Other acute sinusitis: Secondary | ICD-10-CM

## 2010-07-29 DIAGNOSIS — I10 Essential (primary) hypertension: Secondary | ICD-10-CM

## 2010-07-29 DIAGNOSIS — J209 Acute bronchitis, unspecified: Secondary | ICD-10-CM

## 2010-07-29 DIAGNOSIS — E669 Obesity, unspecified: Secondary | ICD-10-CM

## 2010-08-08 NOTE — Letter (Signed)
Summary: Out of Work  [facility]  [address] [place] [address]   [place], [place] [address]   Phone: [phone]  Fax: [phone]    July 29, 2010   Employee:  Suzanne Andrews    To Whom It May Concern:   For Medical reasons, please excuse the above named employee from work for the following dates:  Start:   06/28/10      End:   06/29/10 to return with no restrictions  If you need additional information, please feel free to contact our office.         Sincerely,    Milus Mallick. Lodema Hong, MD

## 2010-08-08 NOTE — Letter (Signed)
Summary: Out of Work  [facility]  [address]   [place], [place] [address]   Phone: [phone]  Fax: [phone]    July 29, 2010   Employee:  Suzanne Andrews    To Whom It May Concern:   For Medical reasons, please excuse the above named employee from work for the following dates:  Start:   06/28/10  End:   06/29/10 to return with no restrictions  If you need additional information, please feel free to contact our office.         Sincerely,    Milus Mallick. Lodema Hong, MD

## 2010-08-08 NOTE — Letter (Signed)
Summary: Out of Work  [facility]  [address]   [place], [place] [address]   Phone: [phone]  Fax: [phone]    July 29, 2010   Employee:  JACKLIN ZWICK    To Whom It May Concern:   For Medical reasons, please excuse the above named employee from work for the following dates:  Start:   06/28/2010  End:   06/30/2010 To return with no restrictions  If you need additional information, please feel free to contact our office.         Sincerely,    Milus Mallick. Lodema Hong, M.D.

## 2010-08-13 NOTE — Assessment & Plan Note (Signed)
Summary: sick   Vital Signs:  Patient profile:   45 year old female Menstrual status:  regular Height:      65.5 inches Weight:      283 pounds BMI:     46.55 O2 Sat:      99 % Temp:     98.1 degrees F oral Pulse rate:   68 / minute Pulse rhythm:   regular Resp:     16 per minute BP sitting:   128 / 82  (left arm)  Vitals Entered By: Everitt Amber LPN (July 29, 2010 10:07 AM)  Nutrition Counseling: Patient's BMI is greater than 25 and therefore counseled on weight management options. CC: sick x 1 week, started with a sore throat, then nasal congestion and chest congestion, hoarsness.   CC:  sick x 1 week, started with a sore throat, then nasal congestion and chest congestion, and hoarsness.Marland Kitchen  History of Present Illness: 1 week h/o sore throat with voce loss, head and chest congestion , post nasal drainage  with excessive cough, body aches and chills Reports  that  prior to this she had been doing well roat. Denies chest congestion, or cough productive of sputum. Denies chest pain, palpitations, PND, orthopnea or leg swelling. Denies abdominal pain, nausea, vomitting, diarrhea or constipation. Denies change in bowel movements or bloody stool. Denies dysuria , frequency, incontinence or hesitancy. Denies  joint pain, swelling, or reduced mobility. Denies headaches, vertigo, seizures. Denies depression, anxiety or insomnia. Denies  rash, lesions, or itch.     Current Medications (verified): 1)  Lisinopril-Hydrochlorothiazide 20-25 Mg Tabs (Lisinopril-Hydrochlorothiazide) .... One and Half Tabs By Mouth Once Daily.  Allergies (verified): No Known Drug Allergies  Review of Systems      See HPI General:  Complains of chills, fatigue, and malaise. Eyes:  Denies discharge, eye pain, and red eye. ENT:  Complains of hoarseness, nasal congestion, postnasal drainage, sinus pressure, and sore throat; 1 week. Resp:  Complains of cough and sputum productive; 1 week . Endo:   Denies cold intolerance, excessive hunger, excessive thirst, excessive urination, and heat intolerance. Heme:  Denies abnormal bruising, bleeding, enlarge lymph nodes, and fevers. Allergy:  Complains of seasonal allergies.  Physical Exam  General:  Well-developed,opbese,in no acute distress; alert,appropriate and cooperative throughout examination. Ill appearing. HEENT: No facial asymmetry,  EOMI, frontal and maxillary  sinus tenderness, TM's Clear, oropharynx  pink and moist.   Chest: Decreased air entry, bilateral crakles and wheezes CVS: S1, S2, No murmurs, No S3.   Abd: Soft, Nontender.  MS: Adequate ROM spine, hips, shoulders and knees.  Ext: No edema.   CNS: CN 2-12 intact, power tone and sensation normal throughout.   Skin: Intact, no visible lesions or rashes.  Psych: Good eye contact, normal affect.  Memory intact, not anxious or depressed appearing.    Impression & Recommendations:  Problem # 1:  ACUTE BRONCHITIS (ICD-466.0) Assessment Comment Only  The following medications were removed from the medication list:    Penicillin V Potassium 500 Mg Tabs (Penicillin v potassium) .Marland Kitchen... Take 1 tablet by mouth three times a day Her updated medication list for this problem includes:    Septra Ds 800-160 Mg Tabs (Sulfamethoxazole-trimethoprim) .Marland Kitchen... Take 1 tablet by mouth two times a day    Tessalon Perles 100 Mg Caps (Benzonatate) .Marland Kitchen... Take 1 capsule by mouth three times a day  Problem # 2:  OTHER ACUTE SINUSITIS (ICD-461.8) Assessment: Comment Only  The following medications were removed from the medication  list:    Penicillin V Potassium 500 Mg Tabs (Penicillin v potassium) .Marland Kitchen... Take 1 tablet by mouth three times a day Her updated medication list for this problem includes:    Septra Ds 800-160 Mg Tabs (Sulfamethoxazole-trimethoprim) .Marland Kitchen... Take 1 tablet by mouth two times a day    Tessalon Perles 100 Mg Caps (Benzonatate) .Marland Kitchen... Take 1 capsule by mouth three times a  day  Problem # 3:  HYPERTENSION (ICD-401.9) Assessment: Unchanged  Her updated medication list for this problem includes:    Lisinopril-hydrochlorothiazide 20-25 Mg Tabs (Lisinopril-hydrochlorothiazide) ..... One and half tabs by mouth once daily.  Orders: T-Basic Metabolic Panel [id number])  BP today: 128/82 Prior BP: 118/80 (03/27/2010)  Labs Reviewed: K+: 4.2 (08/02/2009) Creat: : 0.75 (08/02/2009)   Chol: 172 (08/02/2009)   HDL: 59 (08/02/2009)   LDL: 102 (08/02/2009)   TG: 56 (08/02/2009)  Problem # 4:  OBESITY (ICD-278.00) Assessment: Deteriorated  Ht: 65.5 (07/29/2010)   Wt: 283 (07/29/2010)   BMI: 46.55 (07/29/2010)  Complete Medication List: 1)  Lisinopril-hydrochlorothiazide 20-25 Mg Tabs (Lisinopril-hydrochlorothiazide) .... One and half tabs by mouth once daily. 2)  Septra Ds 800-160 Mg Tabs (Sulfamethoxazole-trimethoprim) .... Take 1 tablet by mouth two times a day 3)  Fluconazole 150 Mg Tabs (Fluconazole) .... Take 1 tablet by mouth once a day as needed for vaginal itch 4)  Tessalon Perles 100 Mg Caps (Benzonatate) .... Take 1 capsule by mouth three times a day 5)  Phentermine Hcl 37.5 Mg Tabs (Phentermine hcl) .... Take 1 tablet by mouth once a day  Other Orders: T-Lipid Profile ([id number]) T-CBC w/Diff ([id number]) T-TSH [id number])  Patient Instructions: 1)  cPE in mid May. 2)  You are being treated for acute sinusitis and bronchitis,'Pls complete the  entire antibiotic course ofseptra for 10 days. 3)  Use tessalon perles with sudafed one or 2 daily when the mucinex finishes. 4)  Your Renata Barge is past due, pls schedulle as soon as possible.Marland Kitchen 5)  BMP prior to visit, ICD-9: 6)  Lipid Panel prior to visit, ICD-9: 7)  TSH prior to visit, ICD-9:   fasting past due pls do asap 8)  CBC w/ Diff prior to visit, ICD-9: 9)  TSH 10)  It is important that you exercise regularly at least 20 minutes 5 times a week. If you develop chest pain, have severe  difficulty breathing, or feel very tired , stop exercising immediately and seek medical attention. 11)  You need to lose weight. Consider a lower calorie diet and regular exercise.  Prescriptions: PHENTERMINE HCL 37.5 MG TABS (PHENTERMINE HCL) Take 1 tablet by mouth once a day  #30 x 1   Entered and Authorized by:   Syliva Overman MD   Signed by:   Syliva Overman MD on 07/29/2010   Method used:   Printed then faxed to ...       [facility] [facility] ([facility]ail)       [address]       [place], [place]  [address]       Ph: [phone]       Fax: [phone]   RxID:   [id number] TESSALON PERLES 100 MG CAPS (BENZONATATE) Take 1 capsule by mouth three times a day  #21 x 0   Entered and Authorized by:   Syliva Overman MD   Signed by:   Syliva Overman MD on 07/29/2010   Method used:   Electronically to  [facility] [address] [facility] ([facility]ail)       [address]       [place], [place]  [address]       Ph: [phone]       Fax: [phone]   RxID:   [id number] FLUCONAZOLE 150 MG TABS (FLUCONAZOLE) Take 1 tablet by mouth once a day as needed for vaginal itch  #3 x 0   Entered and Authorized by:   Syliva Overman MD   Signed by:   Syliva Overman MD on 07/29/2010   Method used:   Electronically to        [facility] [facility])       [address]       [place], [place]  [address]       Ph: [phone]       Fax: [phone]   RxID:   [id number] SEPTRA DS 800-160 MG TABS (SULFAMETHOXAZOLE-TRIMETHOPRIM) Take 1 tablet by mouth two times a day  #20 x 0   Entered and Authorized by:   Syliva Overman MD   Signed by:   Syliva Overman MD on 07/29/2010   Method used:   Electronically to        [facility] [facility])       [address]       [place], [place]  [address]       Ph: [phone]       Fax: [phone]   RxID:   [id number]    Orders Added: 1)  Est. Patient Level IV  [26378] 2)  T-Basic Metabolic Panel [58850-27741] 3)  T-Lipid Profile [80061-22930] 4)  T-CBC w/Diff [[id number]] 5)  T-TSH [[id number]]

## 2010-09-11 ENCOUNTER — Other Ambulatory Visit: Payer: Self-pay | Admitting: *Deleted

## 2010-09-11 MED ORDER — LISINOPRIL-HYDROCHLOROTHIAZIDE 20-25 MG PO TABS: 1.0000 | ORAL_TABLET | Freq: Every day | ORAL | Status: DC

## 2010-10-15 NOTE — Assessment & Plan Note (Signed)
OFFICE VISIT   Suzanne Andrews, Suzanne Andrews  DOB:  08/29/1965                                       10/12/2007  CHART#:[id number]   The patient returns 1 week post laser ablation of her right proximal  greater saphenous vein.  She had a laser ablation of the right proximal  greater saphenous vein which was a difficult procedure because of  difficulty accessing the vein.  This was accomplished in the proximal  third of the thigh and short segment of the greater saphenous vein  treated with laser ablation.  She has had mild to moderate tenderness in  that area which is resolving but no distal edema and states that the  calf seems somewhat softer.  She has been wearing her elastic  compression stocking and is taking the ibuprofen as prescribed.   PHYSICAL EXAMINATION:  On examination there is some mild tenderness over  the proximal third of the right thigh near the saphenofemoral junction.  No distal edema is noted.  The hyperpigmentation in the lower third of  the leg is unchanged with no active ulcer.   I performed a limited venous duplex exam today.  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  This appears occluded for a short distance and then it is  compressible about 10 cm from the saphenofemoral junction.  Further  distally in the mid thigh it does appear to be occluded also.   I have reassured her regarding these findings and have suggested that  she continue to wear short leg elastic compression stockings on a  chronic basis because of her chronic skin changes of hyperpigmentation  even without ulceration.  She will return to see [place] on a p.r.n. basis.   Quita Skye Hart [place], M.D.  Electronically Signed   JDL/MEDQ  D:  10/12/2007  T:  10/13/2007  Job:  [id number]

## 2010-10-15 NOTE — Assessment & Plan Note (Signed)
OFFICE VISIT   Suzanne Andrews, Suzanne Andrews  DOB:  1966/02/26                                       08/10/2007  CHART#:[id number]   HISTORY:  The patient returns for further followup regarding her severe  venous insufficiency of both lower extremities.  She has gross reflux at  both saphenofemoral junctions with incompetent greater saphenous veins  bilaterally.  She has severe hyperpigmentation in the lower part of both  legs over the medial aspect of the calf in typical position.  She has no  stasis ulcers but does have chronic edema in both legs.  There are some  superficial varicosities in the lateral thigh which are not easily seen  because of her obesity.  She does, however, have all of the stigmata of  severe venous insufficiency which is quite symptomatic.  She has been  wearing elastic compression stockings, trying analgesics and her  symptoms have not been relieved.  We will schedule her for laser  ablation of her right greater saphenous vein initially to be followed by  the left greater saphenous vein in the near future.  She has gross  reflux at both saphenofemoral junctions causing her venous hypertension.   Quita Skye Hart Rochester, M.D.  Electronically Signed   JDL/MEDQ  D:  08/10/2007  T:  08/11/2007  Job:  [id number]   cc:   Milus Mallick. Lodema Hong, M.D.

## 2010-10-15 NOTE — Assessment & Plan Note (Signed)
OFFICE VISIT   Suzanne Andrews, ERLANDSON  DOB:  [birth year]                                       09/01/2007  CHART#:[id number]   HISTORY:  The patient presents today for followup of her laser ablation  of her left great saphenous vein by Dr. Hart Rochester on August 23, 2007.  She  has done extremely well following her procedure with minimal discomfort  treated with ibuprofen.  She has had minimal bruising.  She has been up  and around and has been wearing her compression.   PHYSICAL EXAMINATION:  She does not have any significant bruising or  erythema around the saphenous vein ablation site on the left.  She  underwent left venous duplex in our office today and this reveals  closure of her saphenous vein from where it exits the fascia in the mid  thigh up to just below the saphenofemoral junction.  Her common femoral  vein is widely patent with no evidence of thrombus or injury.   I am quite pleased with her initial result as is the patient.  She will  see Dr. Hart Rochester on May 4 for a scheduled right laser ablation.  She will  notify us in the interim if she develops any difficulty.   Larina Earthly, M.D.  Electronically Signed   TFE/MEDQ  D:  09/01/2007  T:  09/01/2007  Job:  [id number]

## 2010-12-12 ENCOUNTER — Other Ambulatory Visit: Payer: Self-pay | Admitting: Family Medicine

## 2010-12-30 ENCOUNTER — Ambulatory Visit (INDEPENDENT_AMBULATORY_CARE_PROVIDER_SITE_OTHER): Payer: Self-pay | Admitting: Family Medicine

## 2010-12-30 ENCOUNTER — Encounter: Payer: Self-pay | Admitting: Family Medicine

## 2010-12-30 VITALS — BP 112/70 | HR 77 | Ht 68.0 in | Wt 275.1 lb

## 2010-12-30 DIAGNOSIS — L03119 Cellulitis of unspecified part of limb: Secondary | ICD-10-CM

## 2010-12-30 DIAGNOSIS — L02419 Cutaneous abscess of limb, unspecified: Secondary | ICD-10-CM

## 2010-12-30 MED ORDER — SULFAMETHOXAZOLE-TRIMETHOPRIM 800-160 MG PO TABS: 1.0000 | ORAL_TABLET | Freq: Two times a day (BID) | ORAL | Status: AC

## 2010-12-30 MED ORDER — TRAMADOL HCL 50 MG PO TABS: 50.0000 mg | ORAL_TABLET | Freq: Three times a day (TID) | ORAL | Status: DC | PRN

## 2010-12-30 NOTE — Progress Notes (Signed)
  Subjective:    Patient ID: Suzanne Andrews, female    DOB: 15-Nov-1965, 45 y.o.   MRN: [id number]  HPI Pt hit Right lower leg on side of bed 3 weeks ago, since then has had non healing sore, that drains some. +redness, +pain , +swelling over area, also noticed skin darkening around the open lesion Above the area she hit, states she has a bug bite, that has not healed She does have sigficant vascular diease history requiring surgical intervention on lower ext in the past; however has not had non healing lesions on lower ext before   Review of Systems No fever, no chills, no N/V, no paresthesia of lower ext      Objective:   Physical Exam GEN- NAD, alert and oriented, obese  RLE- 4.5cm of erythema surrounding open 1x 0.5cm lesion, +induration, +swelling, +TTP, serosanguiouness drainage, with mild odor    Above circular slightly raised 0.5cm erythematous lesion  Wound cleaned at bedside, superficial, no evidence of deep tissue infection or bone involvement    Assessment & Plan:     Cellulitis- wound now infected on lower ext status post trauma. Will treat with Bactrim (coverage for MRSA), given instruction on cleaning and keeping area covered during work, recheck lesion on Friday. If not improved would send to wound care secondary to history of vascular disease in the past. No signs of systemic infection Ultram prn pain Elevation for swelling

## 2010-12-30 NOTE — Patient Instructions (Signed)
Start the antibiotic- Bactrim Stop using the Neosporin Continue with Soap/Water, peroxide is okay  Use the ultram as needed for pain - 3 times a day  Recheck on Friday afternoon If you have fever, notice worsening of cellulitis then go to the hospital

## 2010-12-31 ENCOUNTER — Encounter: Payer: Self-pay | Admitting: Family Medicine

## 2010-12-31 ENCOUNTER — Encounter: Payer: Self-pay | Admitting: *Deleted

## 2010-12-31 ENCOUNTER — Telehealth: Payer: Self-pay | Admitting: *Deleted

## 2010-12-31 NOTE — Telephone Encounter (Signed)
Will give work excuse, she can try to take with food, if she has vomiting with medication please let us know.

## 2010-12-31 NOTE — Telephone Encounter (Signed)
Work note available, patient aware

## 2011-01-10 ENCOUNTER — Ambulatory Visit (INDEPENDENT_AMBULATORY_CARE_PROVIDER_SITE_OTHER): Payer: BC Managed Care – PPO | Admitting: Family Medicine

## 2011-01-10 ENCOUNTER — Encounter: Payer: Self-pay | Admitting: Family Medicine

## 2011-01-10 VITALS — BP 122/70 | HR 87 | Ht 65.5 in | Wt 276.0 lb

## 2011-01-10 DIAGNOSIS — S80821A Blister (nonthermal), right lower leg, initial encounter: Secondary | ICD-10-CM

## 2011-01-10 DIAGNOSIS — L02419 Cutaneous abscess of limb, unspecified: Secondary | ICD-10-CM

## 2011-01-10 DIAGNOSIS — E669 Obesity, unspecified: Secondary | ICD-10-CM

## 2011-01-10 DIAGNOSIS — I10 Essential (primary) hypertension: Secondary | ICD-10-CM

## 2011-01-10 DIAGNOSIS — S81809A Unspecified open wound, unspecified lower leg, initial encounter: Secondary | ICD-10-CM

## 2011-01-10 DIAGNOSIS — L03119 Cellulitis of unspecified part of limb: Secondary | ICD-10-CM

## 2011-01-10 DIAGNOSIS — IMO0002 Reserved for concepts with insufficient information to code with codable children: Secondary | ICD-10-CM

## 2011-01-10 DIAGNOSIS — S81009A Unspecified open wound, unspecified knee, initial encounter: Secondary | ICD-10-CM

## 2011-01-10 NOTE — Progress Notes (Signed)
  Subjective:    Patient ID: Suzanne Andrews, female    DOB: Jan 17, 1966, 45 y.o.   MRN: [id number]  HPI Patient here to followup on her right lower leg wound. She was seen approximately 10 days ago after an injury to a lake left in unhealing wound. She was started on Bactrim for 10 days secondary to surrounding cellulitis. It was noted she had a small blister above the open wound at that time. Today she states that the wound is still not healing. It had a scab over it however every time she showers the scab comes off. She's not had any fever or chills or systemic symptoms. She now has a larger blister and a new place above where she did note that she had scratched. She is unsure she is being bitten by insects. She does have a history of poor circulation in the past which required intervention at the level of the groin. She denies any purulent drainage from the wound. She states that she has sharp pain that occurs near the open wound that shoots up the leg occasionally. This this occurs typically at bedtime but can be random through the day. She is able to work. She's been taking Tylenol because old ultram did not agree with her.  Review of Systems  GEN- denies fever, chills,  GI- denies N/V  Skin- + wound, +blistering      Objective:   Physical Exam GEN- NAD, alert and oriented, obese  RLE- 2cm mild of erythema surrounding open 1x 0.5cm lesion- dark crusting and scab surround open lesion, non indurated,  No swelling, +TTP, minimal serosanguiouness drainage, no odor   Above 1.5cm tense blistering lesion with pink colored fluid, right foot small blistering lesion Skin- see above, hyperpigemented scarring on lower ext EXT- no edema  Pulse- 2+ bilat DP and PT , foot warm to touch bilat       Assessment & Plan:   Cellulitis-  Cellulitis has improved however she still has non healing wound. I do not think she needs any further antibiotics at this time.  Leg wound- nonhealing wound status post  trauma approximately 3-1/2 weeks ago. The distrubution raises some concern for vascular insuffiency as a cause of non healing. I will send her to the wound clinic she will be seen next Thursday. At this visit the wound was cleaned with sterile saline and bacitracin was applied.  Blister- I'm unsure of the cause of the blistering that she has at this time. She does not have multiple blisters to suggest a bullous disorder, these are likley from trauma to the area, though she has had non recently. I will have this evaluated by wound care as well.

## 2011-01-10 NOTE — Patient Instructions (Signed)
Follow-up with the wound center  Keep the areas clean If the blister burst then keep covered Complete course of antibiotics You can get your labwork- do not eat after midnight Schedule a physical with Dr. Lodema Hong

## 2011-01-13 ENCOUNTER — Telehealth: Payer: Self-pay | Admitting: *Deleted

## 2011-01-13 NOTE — Telephone Encounter (Signed)
Opened in error

## 2011-01-17 LAB — COMPREHENSIVE METABOLIC PANEL
ALT: 13 U/L (ref 0–35)
AST: 19 U/L (ref 0–37)
Albumin: 3.6 g/dL (ref 3.5–5.2)
Alkaline Phosphatase: 51 U/L (ref 39–117)
BUN: 12 mg/dL (ref 6–23)
CO2: 31 mEq/L (ref 19–32)
Calcium: 9.3 mg/dL (ref 8.4–10.5)
Chloride: 101 mEq/L (ref 96–112)
Creat: 0.76 mg/dL (ref 0.50–1.10)
Glucose, Bld: 86 mg/dL (ref 70–99)
Potassium: 4 mEq/L (ref 3.5–5.3)
Sodium: 140 mEq/L (ref 135–145)
Total Bilirubin: 0.4 mg/dL (ref 0.3–1.2)
Total Protein: 7 g/dL (ref 6.0–8.3)

## 2011-01-17 LAB — LIPID PANEL
Cholesterol: 179 mg/dL (ref 0–200)
HDL: 53 mg/dL (ref >39)
LDL Cholesterol: 114 mg/dL — ABNORMAL HIGH (ref 0–99)
Total CHOL/HDL Ratio: 3.4 Ratio
Triglycerides: 61 mg/dL (ref <150)
VLDL: 12 mg/dL (ref 0–40)

## 2011-01-17 LAB — TSH: TSH: 1.421 u[IU]/mL (ref 0.350–4.500)

## 2011-01-24 ENCOUNTER — Encounter: Payer: Self-pay | Admitting: Family Medicine

## 2011-01-24 ENCOUNTER — Ambulatory Visit (INDEPENDENT_AMBULATORY_CARE_PROVIDER_SITE_OTHER): Payer: BC Managed Care – PPO | Admitting: Family Medicine

## 2011-01-24 VITALS — BP 92/60 | HR 83 | Ht 65.5 in | Wt 281.1 lb

## 2011-01-24 DIAGNOSIS — S81809A Unspecified open wound, unspecified lower leg, initial encounter: Secondary | ICD-10-CM

## 2011-01-24 DIAGNOSIS — S81009A Unspecified open wound, unspecified knee, initial encounter: Secondary | ICD-10-CM

## 2011-01-24 DIAGNOSIS — E669 Obesity, unspecified: Secondary | ICD-10-CM

## 2011-01-24 DIAGNOSIS — I10 Essential (primary) hypertension: Secondary | ICD-10-CM

## 2011-01-24 NOTE — Patient Instructions (Signed)
Continue your current meds Your labs look good Work on the weight loss and diet, low carb, low fat, increase veggies and water, try decreasing by 1 soda or sweet tea a day  F/u wound care I will send a referral for Mammogram Schedule a visit for your PAP Smear.

## 2011-01-26 DIAGNOSIS — S81809A Unspecified open wound, unspecified lower leg, initial encounter: Secondary | ICD-10-CM | POA: Insufficient documentation

## 2011-01-26 NOTE — Assessment & Plan Note (Signed)
Discussed importance of weight loss, change in diet and re-starting and exercise program once her leg wound has healed. Discussed her risk of diabetes, hyperlipidemia, heart disease with her weight. She appears motivated and is concerned about her obese daughter as well.  Her LDL is 115, she is lower risk for [place] heart disease calculations, will not start statin at this time.

## 2011-01-26 NOTE — Assessment & Plan Note (Signed)
Blood pressure well controlled, reviewed labs, no change to meds

## 2011-01-26 NOTE — Assessment & Plan Note (Signed)
Continue with wound care. Normal ABI per report

## 2011-01-26 NOTE — Progress Notes (Signed)
  Subjective:    Patient ID: Suzanne Andrews, female    DOB: Sep 20, 1965, 45 y.o.   MRN: [id number]  HPI Pt here to f/u labs and leg wound  1. Leg wound- seen multiple occasions for non healing wound on RLE and blistering lesions, s/p antibiotics on first visit for cellulitis. Referred to wound care secondary to concern for vascular insufficiency and unhealing wound. Pt reports normal blood flow after ABI, wound care used unna boots on bilat legs and a cream to the ulcer. It is improving but very slowly. They think swelling of her lower ext may be why, today she has a new unna boot on RLE. Pain in leg improved. She will return to work on Monday 2. Labs- reviewed labs , history of hypertension.   Review of Systems   Denies fever, chills, SOB, Chest pain, leg swelling improved with wraps    Objective:   Physical Exam  GEN- NAD, alert and oriented x 3, obese  CVS-RRR no murmur  RESP- CTAB EXT- Unna boot RLE, thin compression hose LLE, pedal edema noted on LLE      Assessment & Plan:

## 2011-03-13 LAB — URINALYSIS, ROUTINE W REFLEX MICROSCOPIC
Bilirubin Urine: NEGATIVE
Glucose, UA: NEGATIVE
Hgb urine dipstick: NEGATIVE
Ketones, ur: NEGATIVE
Nitrite: NEGATIVE
Protein, ur: NEGATIVE
Specific Gravity, Urine: 1.015
Urobilinogen, UA: 0.2
pH: 7

## 2011-03-13 LAB — CBC
HCT: 36.4
Hemoglobin: 12.1
MCHC: 33.3
MCV: 89.5
Platelets: 318
RBC: 4.07
RDW: 13.4
WBC: 4.5

## 2011-03-13 LAB — DIFFERENTIAL
Basophils Absolute: 0
Basophils Relative: 1
Eosinophils Absolute: 0.2
Eosinophils Relative: 5
Lymphocytes Relative: 30
Lymphs Abs: 1.4
Monocytes Absolute: 0.4
Monocytes Relative: 10
Neutro Abs: 2.5
Neutrophils Relative %: 54

## 2011-03-13 LAB — COMPREHENSIVE METABOLIC PANEL
ALT: 13
AST: 16
Albumin: 3.6
Alkaline Phosphatase: 45
BUN: 7
CO2: 29
Calcium: 9.1
Chloride: 104
Creatinine, Ser: 0.72
GFR calc Af Amer: >60
GFR calc non Af Amer: >60
Glucose, Bld: 104 — ABNORMAL HIGH
Potassium: 3.9
Sodium: 137
Total Bilirubin: 0.6
Total Protein: 7.2

## 2011-03-13 LAB — PREGNANCY, URINE: Preg Test, Ur: NEGATIVE

## 2011-04-02 ENCOUNTER — Encounter: Payer: Self-pay | Admitting: Family Medicine

## 2011-04-02 ENCOUNTER — Ambulatory Visit (INDEPENDENT_AMBULATORY_CARE_PROVIDER_SITE_OTHER): Payer: BC Managed Care – PPO | Admitting: Family Medicine

## 2011-04-02 VITALS — BP 120/80 | HR 66 | Resp 16 | Ht 65.0 in | Wt 282.1 lb

## 2011-04-02 DIAGNOSIS — R7301 Impaired fasting glucose: Secondary | ICD-10-CM

## 2011-04-02 DIAGNOSIS — E669 Obesity, unspecified: Secondary | ICD-10-CM

## 2011-04-02 DIAGNOSIS — N949 Unspecified condition associated with female genital organs and menstrual cycle: Secondary | ICD-10-CM

## 2011-04-02 DIAGNOSIS — R5381 Other malaise: Secondary | ICD-10-CM

## 2011-04-02 DIAGNOSIS — Z23 Encounter for immunization: Secondary | ICD-10-CM

## 2011-04-02 DIAGNOSIS — N83202 Unspecified ovarian cyst, left side: Secondary | ICD-10-CM

## 2011-04-02 DIAGNOSIS — I10 Essential (primary) hypertension: Secondary | ICD-10-CM

## 2011-04-02 DIAGNOSIS — R5383 Other fatigue: Secondary | ICD-10-CM

## 2011-04-02 DIAGNOSIS — R102 Pelvic and perineal pain: Secondary | ICD-10-CM

## 2011-04-02 DIAGNOSIS — R109 Unspecified abdominal pain: Secondary | ICD-10-CM

## 2011-04-02 DIAGNOSIS — R7309 Other abnormal glucose: Secondary | ICD-10-CM

## 2011-04-02 DIAGNOSIS — R7303 Prediabetes: Secondary | ICD-10-CM

## 2011-04-02 DIAGNOSIS — N83209 Unspecified ovarian cyst, unspecified side: Secondary | ICD-10-CM

## 2011-04-02 NOTE — Patient Instructions (Signed)
CPE  Next visit.  You are referred for an [place] of your pelvis to evaluate recurrent right pelvic pain for the last 6 months lasting on avg 2 weeks before cycle  Mammogram is past due , you can schedule this yourself, pls do so   Labs in August showed normAL BLOOD SUGAR, THYROID AND KIDNEY FUNCTION.  bAD CHOLESTEROL WAS SLIGHTLY HIGH, WAS 110 , SHOULD BE UNDER 100, CUT BACK ON RED MEAT, FRIED AND FATTY FOODS, AND THE YELLOW OF EGGS   iBUPROFEN ONE TO TWO DAILY FOR SEVERE ABDOMINAL PAIN  The patient is asked to make an attempt to improve diet and exercise patterns to aid in medical management of this problem. It is important that you exercise regularly at least 30 minutes 5 times a week. If you develop chest pain, have severe difficulty breathing, or feel very tired, stop exercising immediately and seek medical attention    CBC AND hba1c TODAY

## 2011-04-03 ENCOUNTER — Telehealth: Payer: Self-pay | Admitting: Family Medicine

## 2011-04-03 LAB — HEMOGLOBIN A1C
Hgb A1c MFr Bld: 6 % — ABNORMAL HIGH (ref <5.7)
Mean Plasma Glucose: 126 mg/dL — ABNORMAL HIGH (ref <117)

## 2011-04-03 LAB — CBC WITH DIFFERENTIAL/PLATELET
Basophils Absolute: 0 10*3/uL (ref 0.0–0.1)
Basophils Relative: 1 % (ref 0–1)
Eosinophils Absolute: 0.2 10*3/uL (ref 0.0–0.7)
Eosinophils Relative: 4 % (ref 0–5)
HCT: 38.4 % (ref 36.0–46.0)
Hemoglobin: 12.5 g/dL (ref 12.0–15.0)
Lymphocytes Relative: 46 % (ref 12–46)
Lymphs Abs: 2.6 10*3/uL (ref 0.7–4.0)
MCH: 28.5 pg (ref 26.0–34.0)
MCHC: 32.6 g/dL (ref 30.0–36.0)
MCV: 87.5 fL (ref 78.0–100.0)
Monocytes Absolute: 0.6 10*3/uL (ref 0.1–1.0)
Monocytes Relative: 10 % (ref 3–12)
Neutro Abs: 2.1 10*3/uL (ref 1.7–7.7)
Neutrophils Relative %: 39 % — ABNORMAL LOW (ref 43–77)
Platelets: 345 10*3/uL (ref 150–400)
RBC: 4.39 MIL/uL (ref 3.87–5.11)
RDW: 13.7 % (ref 11.5–15.5)
WBC: 5.5 10*3/uL (ref 4.0–10.5)

## 2011-04-03 NOTE — Telephone Encounter (Signed)
For tomorrow and needs  A note for work today and tomorrow please call at [phone] when done

## 2011-04-04 ENCOUNTER — Ambulatory Visit (HOSPITAL_COMMUNITY)
Admission: RE | Admit: 2011-04-04 | Discharge: 2011-04-04 | Disposition: A | Discharge status: Home / Self Care | Payer: BC Managed Care – PPO | Source: Ambulatory Visit | Attending: Family Medicine | Admitting: Family Medicine

## 2011-04-04 ENCOUNTER — Other Ambulatory Visit: Payer: Self-pay | Admitting: Family Medicine

## 2011-04-04 DIAGNOSIS — R1032 Left lower quadrant pain: Secondary | ICD-10-CM | POA: Insufficient documentation

## 2011-04-04 DIAGNOSIS — N83209 Unspecified ovarian cyst, unspecified side: Secondary | ICD-10-CM | POA: Insufficient documentation

## 2011-04-04 DIAGNOSIS — R7303 Prediabetes: Secondary | ICD-10-CM | POA: Insufficient documentation

## 2011-04-04 DIAGNOSIS — Z139 Encounter for screening, unspecified: Secondary | ICD-10-CM

## 2011-04-04 DIAGNOSIS — R109 Unspecified abdominal pain: Secondary | ICD-10-CM

## 2011-04-04 DIAGNOSIS — D259 Leiomyoma of uterus, unspecified: Secondary | ICD-10-CM | POA: Insufficient documentation

## 2011-04-04 DIAGNOSIS — N83202 Unspecified ovarian cyst, left side: Secondary | ICD-10-CM | POA: Insufficient documentation

## 2011-04-04 NOTE — Telephone Encounter (Signed)
pls write new work excuse to cover time requested , asap, I will sign

## 2011-04-04 NOTE — Assessment & Plan Note (Signed)
Controlled, no change in medication  

## 2011-04-04 NOTE — Assessment & Plan Note (Signed)
The need to count carbs, lose weight , and start a regular exercise program is stressed

## 2011-04-04 NOTE — Assessment & Plan Note (Signed)
Unchanged. Patient re-educated about  the importance of commitment to a  minimum of 150 minutes of exercise per week. The importance of healthy food choices with portion control discussed. Encouraged to start a food diary, count calories and to consider  joining a support group. Sample diet sheets offered. Goals set by the patient for the next several months.    

## 2011-04-04 NOTE — Assessment & Plan Note (Signed)
Reports increased pelvic pain 2 weeks duration from mid cycle to onset of menses, worsening in the past 6 months, unable to work and wants this re evaluated Will refer for [place] then f/u from there, likely needs gynae eval

## 2011-04-04 NOTE — Telephone Encounter (Signed)
Patient aware that it is done and will pick up today

## 2011-04-04 NOTE — Progress Notes (Signed)
  Subjective:    Patient ID: Suzanne Andrews, female    DOB: February 14, 1966, 45 y.o.   MRN: [id number]  HPI Approx 6 month h/o worsening left pelvic pain satrting 2 weeks before onset of menses and lasting for the entire duration  until the cycle starts.States she is unable to work for the last several days due to pain and wants this checked. Denies vaginal d/c or urinary symptoms.   Review of Systems See HPI Denies recent fever or chills. Denies sinus pressure, nasal congestion, ear pain or sore throat. Denies chest congestion, productive cough or wheezing. Denies chest pains, palpitations and leg swelling Denies nausea, vomiting,diarrhea or constipation. Has had no change in stool character  Denies dysuria, frequency, hesitancy or incontinence. Denies joint pain, swelling and limitation in mobility. Denies headaches, seizures, numbness, or tingling. Denies depression, anxiety or insomnia. Denies skin break down or rash.        Objective:   Physical Exam Patient alert and oriented and in no cardiopulmonary distress.  HEENT: No facial asymmetry, EOMI, no sinus tenderness,  oropharynx pink and moist.  Neck supple no adenopathy.  Chest: Clear to auscultation bilaterally.  CVS: S1, S2 no murmurs, no S3.  ABD: Soft , left lower quadrant tenderness, no guarding or rebound. No palpable organomegaly Ext: No edema  MS: Adequate ROM spine, shoulders, hips and knees.  Skin: Intact, no ulcerations or rash noted.  Psych: Good eye contact, normal affect. Memory intact not anxious or depressed appearing.  CNS: CN 2-12 intact, power, tone and sensation normal throughout.        Assessment & Plan:

## 2011-04-07 ENCOUNTER — Other Ambulatory Visit (HOSPITAL_COMMUNITY): Payer: BC Managed Care – PPO

## 2011-04-14 ENCOUNTER — Ambulatory Visit (HOSPITAL_COMMUNITY)
Admission: RE | Admit: 2011-04-14 | Discharge: 2011-04-14 | Disposition: A | Discharge status: Home / Self Care | Payer: BC Managed Care – PPO | Source: Ambulatory Visit | Attending: Family Medicine | Admitting: Family Medicine

## 2011-04-14 DIAGNOSIS — Z1231 Encounter for screening mammogram for malignant neoplasm of breast: Secondary | ICD-10-CM | POA: Insufficient documentation

## 2011-04-14 DIAGNOSIS — Z139 Encounter for screening, unspecified: Secondary | ICD-10-CM

## 2011-04-21 ENCOUNTER — Telehealth: Payer: Self-pay | Admitting: Family Medicine

## 2011-04-21 NOTE — Telephone Encounter (Signed)
pls explain to pt that based on the office visit I was under the impression she would return on the stated date, this after a phone call which extended her work [employer] day, I am not able to keep extending work excuses, and she definitely needs to keep appt wioth gynae and have the situaation dealt with. This is the last time I will extend this work note pls let her know.pls type extended note I will sign.

## 2011-04-21 NOTE — Telephone Encounter (Signed)
Note was to be off 11/1 and 11/2 and return 11/3. She said she didn't go back until the 6th, so she needs it to go through the 5th. Ok to re write?

## 2011-06-02 ENCOUNTER — Encounter: Payer: Self-pay | Admitting: Family Medicine

## 2011-06-06 ENCOUNTER — Encounter: Payer: Self-pay | Admitting: Family Medicine

## 2011-06-09 ENCOUNTER — Other Ambulatory Visit (HOSPITAL_COMMUNITY)
Admission: RE | Admit: 2011-06-09 | Discharge: 2011-06-09 | Disposition: A | Discharge status: Home / Self Care | Payer: BC Managed Care – PPO | Source: Ambulatory Visit | Attending: Family Medicine | Admitting: Family Medicine

## 2011-06-09 ENCOUNTER — Encounter: Payer: Self-pay | Admitting: Family Medicine

## 2011-06-09 ENCOUNTER — Ambulatory Visit (INDEPENDENT_AMBULATORY_CARE_PROVIDER_SITE_OTHER): Payer: BC Managed Care – PPO | Admitting: Family Medicine

## 2011-06-09 VITALS — BP 120/82 | HR 85 | Resp 18 | Ht 65.0 in | Wt 278.0 lb

## 2011-06-09 DIAGNOSIS — R109 Unspecified abdominal pain: Secondary | ICD-10-CM

## 2011-06-09 DIAGNOSIS — N920 Excessive and frequent menstruation with regular cycle: Secondary | ICD-10-CM

## 2011-06-09 DIAGNOSIS — I10 Essential (primary) hypertension: Secondary | ICD-10-CM

## 2011-06-09 DIAGNOSIS — Z0000 Encounter for general adult medical examination without abnormal findings: Secondary | ICD-10-CM

## 2011-06-09 DIAGNOSIS — Z01419 Encounter for gynecological examination (general) (routine) without abnormal findings: Secondary | ICD-10-CM | POA: Insufficient documentation

## 2011-06-09 DIAGNOSIS — R7309 Other abnormal glucose: Secondary | ICD-10-CM

## 2011-06-09 DIAGNOSIS — R7303 Prediabetes: Secondary | ICD-10-CM

## 2011-06-09 DIAGNOSIS — E669 Obesity, unspecified: Secondary | ICD-10-CM

## 2011-06-09 MED ORDER — IBUPROFEN 800 MG PO TABS: 800.0000 mg | ORAL_TABLET | Freq: Three times a day (TID) | ORAL | Status: AC | PRN

## 2011-06-09 NOTE — Assessment & Plan Note (Signed)
Controlled, no change in medication  

## 2011-06-09 NOTE — Assessment & Plan Note (Addendum)
unchnaged Patient re-educated about  the importance of commitment to a  minimum of 150 minutes of exercise per week. The importance of healthy food choices with portion control discussed. Encouraged to start a food diary, count calories and to consider  joining a support group. Sample diet sheets offered. Goals set by the patient for the next several months.    

## 2011-06-09 NOTE — Patient Instructions (Addendum)
F/U in March.  You are prediabetic. You need to follow a low carb eating plan. Please call and make appt to attend a class at the hospital. The number is on the paper provided.  You are also being provided with a 1500 cal diet sheet.  Please  Start regular exercise   You need to plan on 8 pound weight loss.  Blood pressure is good.  You need tofollow a low fat diet  Hba1C ,and chem 7 in March

## 2011-06-09 NOTE — Progress Notes (Signed)
  Subjective:    Patient ID: Suzanne Andrews, female    DOB: 02/19/1966, 46 y.o.   MRN: [id number]  HPI The PT is here for annual exam  and re-evaluation of chronic medical conditions, medication management and review of any available recent lab and radiology data.  Preventive health is updated, specifically  Cancer screening and Immunization.   Questions or concerns regarding consultations or procedures which the PT has had in the interim are  Addressed.Reports lightening of menses on megace, still however having significant pelvic pain, requests ibuprofen The PT denies any adverse reactions to current medications since the last visit. Tramadol had been prescribed for pain but she did not tolerate this There are no new concerns.  There are no specific complaints       Review of Systems See HPI Denies recent fever or chills. Denies sinus pressure, nasal congestion, ear pain or sore throat. Denies chest congestion, productive cough or wheezing. Denies chest pains, palpitations and leg swelling Denies  nausea, vomiting,diarrhea or constipation.   Denies dysuria, frequency, hesitancy or incontinence. Denies joint pain, swelling and limitation in mobility. Denies headaches, seizures, numbness, or tingling. Denies depression, anxiety or insomnia. Denies skin break down or rash.        Objective:   Physical Exam  Pleasant well nourished female, alert and oriented x 3, in no cardio-pulmonary distress. Afebrile. HEENT No facial trauma or asymetry. Sinuses non tender.  EOMI, PERTL, fundoscopic exam is normal, no hemorhage or exudate.  External ears normal, tympanic membranes clear. Oropharynx moist, no exudate, good dentition. Neck: supple, no adenopathy,JVD or thyromegaly.No bruits.  Chest: Clear to ascultation bilaterally.No crackles or wheezes. Non tender to palpation  Breast: No asymetry,no masses. No nipple discharge or inversion. No axillary or supraclavicular  adenopathy  Cardiovascular system; Heart sounds normal,  S1 and  S2 ,no S3.  No murmur, or thrill. Apical beat not displaced Peripheral pulses normal.  Abdomen: Soft, non tender, no organomegaly or masses. No bruits. Bowel sounds normal. No guarding, tenderness or rebound.  Rectal:  No mass. Guaiac negative stool.  GU: External genitalia normal. No lesions. Vaginal canal normal.No discharge. Uterus normal size, no adnexal masses, no cervical motion or adnexal tenderness.  Musculoskeletal exam: Full ROM of spine, hips , shoulders and knees. No deformity ,swelling or crepitus noted. No muscle wasting or atrophy.   Neurologic: Cranial nerves 2 to 12 intact. Power, tone ,sensation and reflexes normal throughout. No disturbance in gait. No tremor.  Skin: Intact, no ulceration, erythema , scaling or rash noted. Pigmentation normal throughout  Psych; Normal mood and affect. Judgement and concentration normal      Assessment & Plan:

## 2011-06-09 NOTE — Assessment & Plan Note (Signed)
hAS been eval by gynae and is on megace for cycle reduction

## 2011-06-09 NOTE — Assessment & Plan Note (Signed)
Low carb diet discussed and encouraged also commitment to exercise and weight loss to reduce the risk of becoming diabetic

## 2011-06-09 NOTE — Assessment & Plan Note (Signed)
Flares with menses, ibuprofen prescribed

## 2011-06-11 LAB — POC HEMOCCULT BLD/STL (OFFICE/1-CARD/DIAGNOSTIC): Fecal Occult Blood, POC: NEGATIVE

## 2011-06-11 NOTE — Progress Notes (Signed)
Addended by: Kandis Fantasia B on: 06/11/2011 08:26 AM   Modules accepted: Orders

## 2011-06-20 ENCOUNTER — Other Ambulatory Visit: Payer: Self-pay | Admitting: Family Medicine

## 2011-08-20 ENCOUNTER — Ambulatory Visit: Payer: BC Managed Care – PPO | Admitting: Family Medicine

## 2011-12-25 ENCOUNTER — Other Ambulatory Visit: Payer: Self-pay | Admitting: Family Medicine

## 2012-02-09 ENCOUNTER — Other Ambulatory Visit: Payer: Self-pay | Admitting: Family Medicine

## 2012-02-12 ENCOUNTER — Encounter: Payer: Self-pay | Admitting: Family Medicine

## 2012-02-12 ENCOUNTER — Ambulatory Visit (INDEPENDENT_AMBULATORY_CARE_PROVIDER_SITE_OTHER): Payer: Self-pay | Admitting: Family Medicine

## 2012-02-12 VITALS — BP 148/90 | HR 79 | Resp 16 | Ht 65.0 in | Wt 287.0 lb

## 2012-02-12 DIAGNOSIS — R7303 Prediabetes: Secondary | ICD-10-CM

## 2012-02-12 DIAGNOSIS — Z113 Encounter for screening for infections with a predominantly sexual mode of transmission: Secondary | ICD-10-CM

## 2012-02-12 DIAGNOSIS — R7309 Other abnormal glucose: Secondary | ICD-10-CM

## 2012-02-12 DIAGNOSIS — I10 Essential (primary) hypertension: Secondary | ICD-10-CM

## 2012-02-12 DIAGNOSIS — Z23 Encounter for immunization: Secondary | ICD-10-CM

## 2012-02-12 DIAGNOSIS — E669 Obesity, unspecified: Secondary | ICD-10-CM

## 2012-02-12 LAB — HIV ANTIBODY (ROUTINE TESTING W REFLEX): HIV: NONREACTIVE

## 2012-02-12 NOTE — Patient Instructions (Addendum)
F/u in 6 month  Fasting chem 7 asap.  Blood pressure is elevated, you need to exercise and work on weight loss, no med change now  Mammogram is due in novemebr, check and see if you can get a free mammogram through the hospital  Please work on getting insurance coverage

## 2012-02-13 LAB — BASIC METABOLIC PANEL
BUN: 7 mg/dL (ref 6–23)
CO2: 28 mEq/L (ref 19–32)
Calcium: 9.9 mg/dL (ref 8.4–10.5)
Chloride: 102 mEq/L (ref 96–112)
Creat: 0.82 mg/dL (ref 0.50–1.10)
Glucose, Bld: 82 mg/dL (ref 70–99)
Potassium: 3.5 mEq/L (ref 3.5–5.3)
Sodium: 142 mEq/L (ref 135–145)

## 2012-02-16 NOTE — Assessment & Plan Note (Signed)
Deteriorated. Patient re-educated about  the importance of commitment to a  minimum of 150 minutes of exercise per week. The importance of healthy food choices with portion control discussed. Encouraged to start a food diary, count calories and to consider  joining a support group. Sample diet sheets offered. Goals set by the patient for the next several months.    

## 2012-02-16 NOTE — Assessment & Plan Note (Signed)
Patient educated about the importance of limiting  Carbohydrate intake , the need to commit to daily physical activity for a minimum of 30 minutes , and to commit weight loss. The fact that changes in all these areas will reduce or eliminate all together the development of diabetes is stressed.    

## 2012-02-16 NOTE — Progress Notes (Signed)
  Subjective:    Patient ID: Suzanne Andrews, female    DOB: 1966-03-11, 46 y.o.   MRN: [id number]  HPI  The PT is here for follow up and re-evaluation of chronic medical conditions, medication management and review of any available recent lab and radiology data.  Preventive health is updated, specifically  Cancer screening and Immunization.   Questions or concerns regarding consultations or procedures which the PT has had in the interim are  addressed. The PT denies any adverse reactions to current medications since the last visit.  There are no new concerns.  There are no specific complaints      Review of Systems See HPI Denies recent fever or chills. Denies sinus pressure, nasal congestion, ear pain or sore throat. Denies chest congestion, productive cough or wheezing. Denies chest pains, palpitations and leg swelling Denies abdominal pain, nausea, vomiting,diarrhea or constipation.   Denies dysuria, frequency, hesitancy or incontinence. Denies joint pain, swelling and limitation in mobility. Denies headaches, seizures, numbness, or tingling. Denies depression, anxiety or insomnia. Denies skin break down or rash.        Objective:   Physical Exam Patient alert and oriented and in no cardiopulmonary distress.  HEENT: No facial asymmetry, EOMI, no sinus tenderness,  oropharynx pink and moist.  Neck supple no adenopathy.  Chest: Clear to auscultation bilaterally.  CVS: S1, S2 no murmurs, no S3.  ABD: Soft non tender. Bowel sounds normal.  Ext: No edema  MS: Adequate ROM spine, shoulders, hips and knees.  Skin: Intact, no ulcerations or rash noted.  Psych: Good eye contact, normal affect. Memory intact not anxious or depressed appearing.  CNS: CN 2-12 intact, power, tone and sensation normal throughout.        Assessment & Plan:

## 2012-02-16 NOTE — Assessment & Plan Note (Signed)
Controlled, no change in medication DASH diet and commitment to daily physical activity for a minimum of 30 minutes discussed and encouraged, as a part of hypertension management. The importance of attaining a healthy weight is also discussed.  

## 2012-03-22 ENCOUNTER — Other Ambulatory Visit: Payer: Self-pay | Admitting: Family Medicine

## 2012-05-03 ENCOUNTER — Other Ambulatory Visit: Payer: Self-pay | Admitting: Family Medicine

## 2012-07-17 ENCOUNTER — Other Ambulatory Visit: Payer: Self-pay | Admitting: Family Medicine

## 2012-07-17 DIAGNOSIS — Z139 Encounter for screening, unspecified: Secondary | ICD-10-CM

## 2012-07-22 ENCOUNTER — Ambulatory Visit (HOSPITAL_COMMUNITY)
Admission: RE | Admit: 2012-07-22 | Discharge: 2012-07-22 | Disposition: A | Discharge status: Home / Self Care | Payer: Medicaid Other | Source: Ambulatory Visit | Attending: Family Medicine | Admitting: Family Medicine

## 2012-07-22 DIAGNOSIS — Z1231 Encounter for screening mammogram for malignant neoplasm of breast: Secondary | ICD-10-CM | POA: Insufficient documentation

## 2012-07-22 DIAGNOSIS — Z139 Encounter for screening, unspecified: Secondary | ICD-10-CM

## 2012-07-26 ENCOUNTER — Other Ambulatory Visit: Payer: Self-pay | Admitting: Family Medicine

## 2012-07-26 DIAGNOSIS — R928 Other abnormal and inconclusive findings on diagnostic imaging of breast: Secondary | ICD-10-CM

## 2012-08-04 ENCOUNTER — Other Ambulatory Visit: Payer: Self-pay | Admitting: Family Medicine

## 2012-08-04 ENCOUNTER — Telehealth: Payer: Self-pay | Admitting: Family Medicine

## 2012-08-04 ENCOUNTER — Ambulatory Visit (HOSPITAL_COMMUNITY)
Admission: RE | Admit: 2012-08-04 | Discharge: 2012-08-04 | Disposition: A | Discharge status: Home / Self Care | Payer: Medicaid Other | Source: Ambulatory Visit | Attending: Family Medicine | Admitting: Family Medicine

## 2012-08-04 ENCOUNTER — Ambulatory Visit: Payer: Self-pay | Admitting: Family Medicine

## 2012-08-04 DIAGNOSIS — M25569 Pain in unspecified knee: Secondary | ICD-10-CM

## 2012-08-04 DIAGNOSIS — R928 Other abnormal and inconclusive findings on diagnostic imaging of breast: Secondary | ICD-10-CM

## 2012-08-04 DIAGNOSIS — R921 Mammographic calcification found on diagnostic imaging of breast: Secondary | ICD-10-CM

## 2012-08-04 NOTE — Telephone Encounter (Signed)
referrla has been entered, however, she also needs to be seen in the office in the next 4 to 6 weeks, neeeds re eval and labs has not been in for 6 month,pls let her know and also schedule

## 2012-08-10 ENCOUNTER — Ambulatory Visit
Admission: RE | Admit: 2012-08-10 | Discharge: 2012-08-10 | Disposition: A | Discharge status: Home / Self Care | Payer: Medicaid Other | Source: Ambulatory Visit | Attending: Family Medicine | Admitting: Family Medicine

## 2012-08-10 DIAGNOSIS — R921 Mammographic calcification found on diagnostic imaging of breast: Secondary | ICD-10-CM

## 2012-08-12 ENCOUNTER — Ambulatory Visit: Payer: Self-pay | Admitting: Family Medicine

## 2012-09-10 ENCOUNTER — Encounter: Payer: Self-pay | Admitting: Family Medicine

## 2012-09-10 ENCOUNTER — Ambulatory Visit (INDEPENDENT_AMBULATORY_CARE_PROVIDER_SITE_OTHER): Payer: Medicaid Other | Admitting: Family Medicine

## 2012-09-10 VITALS — BP 120/74 | HR 87 | Resp 16 | Ht 65.0 in | Wt 283.0 lb

## 2012-09-10 DIAGNOSIS — M25569 Pain in unspecified knee: Secondary | ICD-10-CM

## 2012-09-10 DIAGNOSIS — Z139 Encounter for screening, unspecified: Secondary | ICD-10-CM

## 2012-09-10 DIAGNOSIS — I10 Essential (primary) hypertension: Secondary | ICD-10-CM

## 2012-09-10 DIAGNOSIS — R7309 Other abnormal glucose: Secondary | ICD-10-CM

## 2012-09-10 DIAGNOSIS — R5383 Other fatigue: Secondary | ICD-10-CM

## 2012-09-10 DIAGNOSIS — E669 Obesity, unspecified: Secondary | ICD-10-CM

## 2012-09-10 DIAGNOSIS — R5381 Other malaise: Secondary | ICD-10-CM

## 2012-09-10 DIAGNOSIS — J309 Allergic rhinitis, unspecified: Secondary | ICD-10-CM

## 2012-09-10 DIAGNOSIS — R7303 Prediabetes: Secondary | ICD-10-CM

## 2012-09-10 MED ORDER — LISINOPRIL-HYDROCHLOROTHIAZIDE 20-25 MG PO TABS: ORAL_TABLET | ORAL | Status: DC

## 2012-09-10 MED ORDER — PHENTERMINE HCL 37.5 MG PO TABS: 37.5000 mg | ORAL_TABLET | Freq: Every day | ORAL | Status: DC

## 2012-09-10 NOTE — Assessment & Plan Note (Signed)
Increased symptoms pt to start claritin daily

## 2012-09-10 NOTE — Progress Notes (Signed)
  Subjective:    Patient ID: Suzanne Andrews, female    DOB: 1966/04/02, 47 y.o.   MRN: [id number]  HPI The PT is here for follow up and re-evaluation of chronic medical conditions, medication management and review of any available recent lab and radiology data.  Preventive health is updated, specifically  Cancer screening and Immunization.   Questions or concerns regarding consultations or procedures which the PT has had in the interim are  Addressed.has seen ortho for knee pain with good result, has had injections The PT denies any adverse reactions to current medications since the last visit.  Concerned about weight and wants to start medication to help with weight loss There are no specific complaints       Review of Systems See HPI Denies recent fever or chills. Denies sinus pressure, ear pain or sore throat.Does have increased nasal congestion and clear drainage, also excessive sneezing Denies chest congestion, productive cough or wheezing. Denies chest pains, palpitations and leg swelling Denies abdominal pain, nausea, vomiting,diarrhea or constipation.   Denies dysuria, frequency, hesitancy or incontinence. Denies headaches, seizures, numbness, or tingling. Denies depression, anxiety or insomnia. Denies skin break down or rash.        Objective:   Physical Exam Patient alert and oriented and in no cardiopulmonary distress.  HEENT: No facial asymmetry, EOMI, no sinus tenderness,  oropharynx pink and moist.  Neck supple no adenopathy.  Chest: Clear to auscultation bilaterally.  CVS: S1, S2 no murmurs, no S3.  ABD: Soft non tender. Bowel sounds normal.  Ext: No edema  MS: Adequate ROM spine, shoulders, hips and knees.  Skin: Intact, no ulcerations or rash noted.  Psych: Good eye contact, normal affect. Memory intact not anxious or depressed appearing.  CNS: CN 2-12 intact, power, tone and sensation normal throughout.        Assessment & Plan:

## 2012-09-10 NOTE — Assessment & Plan Note (Signed)
Being treated by ortho, with injections, slightly better

## 2012-09-10 NOTE — Assessment & Plan Note (Signed)
Patient educated about the importance of limiting  Carbohydrate intake , the need to commit to daily physical activity for a minimum of 30 minutes , and to commit weight loss. The fact that changes in all these areas will reduce or eliminate all together the development of diabetes is stressed.   Updated lab needed 

## 2012-09-10 NOTE — Assessment & Plan Note (Signed)
Controlled, no change in medication DASH diet and commitment to daily physical activity for a minimum of 30 minutes discussed and encouraged, as a part of hypertension management. The importance of attaining a healthy weight is also discussed.  

## 2012-09-10 NOTE — Patient Instructions (Addendum)
F/u in 4 month, call if you need me before   Weight loss goal of 3 to 4 pounds per month.  Start phentermine HALF tablet once daily  It is important that you exercise regularly at least 30 minutes 5 times a week. If you develop chest pain, have severe difficulty breathing, or feel very tired, stop exercising immediately and seek medical attention    Blood pressure is good, no med change  \ Fasting lipid, chem 7 , CBC, hBA1C and TSH and Vit D next Monday

## 2013-02-17 ENCOUNTER — Ambulatory Visit: Payer: Medicaid Other | Admitting: Family Medicine

## 2013-02-23 ENCOUNTER — Encounter: Payer: Self-pay | Admitting: Family Medicine

## 2013-02-23 ENCOUNTER — Ambulatory Visit (INDEPENDENT_AMBULATORY_CARE_PROVIDER_SITE_OTHER): Payer: Medicaid Other | Admitting: Family Medicine

## 2013-02-23 VITALS — BP 122/72 | HR 86 | Resp 18 | Ht 65.0 in | Wt 270.0 lb

## 2013-02-23 DIAGNOSIS — E669 Obesity, unspecified: Secondary | ICD-10-CM

## 2013-02-23 DIAGNOSIS — N83209 Unspecified ovarian cyst, unspecified side: Secondary | ICD-10-CM

## 2013-02-23 DIAGNOSIS — R7309 Other abnormal glucose: Secondary | ICD-10-CM

## 2013-02-23 DIAGNOSIS — J309 Allergic rhinitis, unspecified: Secondary | ICD-10-CM

## 2013-02-23 DIAGNOSIS — Z23 Encounter for immunization: Secondary | ICD-10-CM

## 2013-02-23 DIAGNOSIS — R7303 Prediabetes: Secondary | ICD-10-CM

## 2013-02-23 DIAGNOSIS — I10 Essential (primary) hypertension: Secondary | ICD-10-CM

## 2013-02-23 DIAGNOSIS — N83202 Unspecified ovarian cyst, left side: Secondary | ICD-10-CM

## 2013-02-23 MED ORDER — PHENTERMINE HCL 37.5 MG PO TABS: 37.5000 mg | ORAL_TABLET | Freq: Every day | ORAL | Status: DC

## 2013-02-23 NOTE — Assessment & Plan Note (Signed)
Updated lab needed Patient educated about the importance of limiting  Carbohydrate intake , the need to commit to daily physical activity for a minimum of 30 minutes , and to commit weight loss. The fact that changes in all these areas will reduce or eliminate all together the development of diabetes is stressed.    

## 2013-02-23 NOTE — Assessment & Plan Note (Signed)
Controlled, no change in medication  

## 2013-02-23 NOTE — Patient Instructions (Addendum)
CPE and pap in 4 month, call if you need me before PLS get labs asap  Flu vaccine today  Phentermine is one daily with 4 pound per month weight loss Suzanne Andrews  It is important that you exercise regularly at least 30 minutes 5 times a week. If you develop chest pain, have severe difficulty breathing, or feel very tired, stop exercising immediately and seek medical attention

## 2013-02-23 NOTE — Assessment & Plan Note (Signed)
Improved. Pt applauded on succesful weight loss through lifestyle change, and encouraged to continue same. Weight loss goal set for the next several months.  

## 2013-02-23 NOTE — Assessment & Plan Note (Signed)
Controlled, no change in medication DASH diet and commitment to daily physical activity for a minimum of 30 minutes discussed and encouraged, as a part of hypertension management. The importance of attaining a healthy weight is also discussed.  

## 2013-02-23 NOTE — Progress Notes (Signed)
  Subjective:    Patient ID: Suzanne Andrews, female    DOB: 03-23-1966, 47 y.o.   MRN: [id number]  HPI The PT is here for follow up and re-evaluation of chronic medical conditions, medication management and review of any available recent lab and radiology data.  Preventive health is updated, specifically  Cancer screening and Immunization.   Questions or concerns regarding consultations or procedures which the PT has had in the interim are  addressed. The PT denies any adverse reactions to current medications since the last visit.  There are no new concerns.States rthat last Friday thru Monday , she was sick with cough and chills, this has now cleared and she has had hjer flu vaccine. Requested work note for absent day last week into Monday, i explained that this was not possible as she had not been seen in the office, advised when too sick to work if unable to be seen in office in 24 hrs, she would need to seek eval in urgent care or ED Happy with weight loss and has found phentermine to be helpful, denies adverse s/e wants to continue and will commit to necessary exercise and dietary chnage    Review of Systems See HPI Denies recent fever or chills. Denies sinus pressure, nasal congestion, ear pain or sore throat. Denies chest congestion, productive cough or wheezing. Denies chest pains, palpitations and leg swelling Denies abdominal pain, nausea, vomiting,diarrhea or constipation.   Denies dysuria, frequency, hesitancy or incontinence. Denies uncontrolled  joint pain, swelling and limitation in mobility.Notes improvement with weight loss Denies headaches, seizures, numbness, or tingling. Denies depression, anxiety or insomnia. Denies skin break down or rash.        Objective:   Physical Exam  Patient alert and oriented and in no cardiopulmonary distress.  HEENT: No facial asymmetry, EOMI, no sinus tenderness,  oropharynx pink and moist.  Neck supple no  adenopathy.  Chest: Clear to auscultation bilaterally.  CVS: S1, S2 no murmurs, no S3.  ABD: Soft non tender. Bowel sounds normal.  Ext: No edema  MS: Adequate ROM spine, shoulders, hips and knees.  Skin: Intact, no ulcerations or rash noted.  Psych: Good eye contact, normal affect. Memory intact not anxious or depressed appearing.  CNS: CN 2-12 intact, power, tone and sensation normal throughout.       Assessment & Plan:

## 2013-02-24 ENCOUNTER — Other Ambulatory Visit: Payer: Self-pay | Admitting: Family Medicine

## 2013-02-24 ENCOUNTER — Other Ambulatory Visit: Payer: Self-pay

## 2013-02-24 LAB — CBC WITH DIFFERENTIAL/PLATELET
Basophils Absolute: 0 10*3/uL (ref 0.0–0.1)
Basophils Relative: 1 % (ref 0–1)
Eosinophils Absolute: 0.2 10*3/uL (ref 0.0–0.7)
Eosinophils Relative: 4 % (ref 0–5)
HCT: 31.6 % — ABNORMAL LOW (ref 36.0–46.0)
Hemoglobin: 10.5 g/dL — ABNORMAL LOW (ref 12.0–15.0)
Lymphocytes Relative: 38 % (ref 12–46)
Lymphs Abs: 2.2 10*3/uL (ref 0.7–4.0)
MCH: 26.4 pg (ref 26.0–34.0)
MCHC: 33.2 g/dL (ref 30.0–36.0)
MCV: 79.4 fL (ref 78.0–100.0)
Monocytes Absolute: 0.6 10*3/uL (ref 0.1–1.0)
Monocytes Relative: 10 % (ref 3–12)
Neutro Abs: 2.7 10*3/uL (ref 1.7–7.7)
Neutrophils Relative %: 47 % (ref 43–77)
Platelets: 454 10*3/uL — ABNORMAL HIGH (ref 150–400)
RBC: 3.98 MIL/uL (ref 3.87–5.11)
RDW: 16.5 % — ABNORMAL HIGH (ref 11.5–15.5)
WBC: 5.7 10*3/uL (ref 4.0–10.5)

## 2013-02-24 MED ORDER — LISINOPRIL-HYDROCHLOROTHIAZIDE 20-25 MG PO TABS: ORAL_TABLET | ORAL | Status: DC

## 2013-02-25 ENCOUNTER — Other Ambulatory Visit: Payer: Self-pay | Admitting: Family Medicine

## 2013-02-25 LAB — BASIC METABOLIC PANEL
BUN: 9 mg/dL (ref 6–23)
CO2: 26 mEq/L (ref 19–32)
Calcium: 9.3 mg/dL (ref 8.4–10.5)
Chloride: 99 mEq/L (ref 96–112)
Creat: 0.76 mg/dL (ref 0.50–1.10)
Glucose, Bld: 84 mg/dL (ref 70–99)
Potassium: 3.7 mEq/L (ref 3.5–5.3)
Sodium: 137 mEq/L (ref 135–145)

## 2013-02-25 LAB — VITAMIN D 25 HYDROXY (VIT D DEFICIENCY, FRACTURES): Vit D, 25-Hydroxy: 20 ng/mL — ABNORMAL LOW (ref 30–89)

## 2013-02-25 LAB — LIPID PANEL
Cholesterol: 171 mg/dL (ref 0–200)
HDL: 60 mg/dL (ref >39)
LDL Cholesterol: 102 mg/dL — ABNORMAL HIGH (ref 0–99)
Total CHOL/HDL Ratio: 2.9 Ratio
Triglycerides: 46 mg/dL (ref <150)
VLDL: 9 mg/dL (ref 0–40)

## 2013-02-25 LAB — HEMOGLOBIN A1C
Hgb A1c MFr Bld: 6 % — ABNORMAL HIGH (ref <5.7)
Mean Plasma Glucose: 126 mg/dL — ABNORMAL HIGH (ref <117)

## 2013-02-25 LAB — TSH: TSH: 1.406 u[IU]/mL (ref 0.350–4.500)

## 2013-02-28 LAB — FERRITIN: Ferritin: 6 ng/mL — ABNORMAL LOW (ref 10–291)

## 2013-02-28 LAB — IRON: Iron: 42 ug/dL (ref 42–145)

## 2013-04-07 ENCOUNTER — Other Ambulatory Visit: Payer: Self-pay

## 2013-04-07 MED ORDER — METFORMIN HCL 500 MG PO TABS: 500.0000 mg | ORAL_TABLET | Freq: Every day | ORAL | Status: DC

## 2013-07-05 ENCOUNTER — Ambulatory Visit: Payer: Medicaid Other | Admitting: Family Medicine

## 2013-07-15 ENCOUNTER — Encounter: Payer: Self-pay | Admitting: Family Medicine

## 2013-07-15 ENCOUNTER — Ambulatory Visit (INDEPENDENT_AMBULATORY_CARE_PROVIDER_SITE_OTHER): Payer: Medicaid Other | Admitting: Family Medicine

## 2013-07-15 VITALS — BP 114/80 | HR 90 | Resp 16 | Ht 66.5 in | Wt 252.0 lb

## 2013-07-15 DIAGNOSIS — E669 Obesity, unspecified: Secondary | ICD-10-CM

## 2013-07-15 DIAGNOSIS — R7303 Prediabetes: Secondary | ICD-10-CM

## 2013-07-15 DIAGNOSIS — R7309 Other abnormal glucose: Secondary | ICD-10-CM

## 2013-07-15 DIAGNOSIS — I83009 Varicose veins of unspecified lower extremity with ulcer of unspecified site: Secondary | ICD-10-CM

## 2013-07-15 DIAGNOSIS — Z139 Encounter for screening, unspecified: Secondary | ICD-10-CM

## 2013-07-15 DIAGNOSIS — B351 Tinea unguium: Secondary | ICD-10-CM

## 2013-07-15 DIAGNOSIS — I10 Essential (primary) hypertension: Secondary | ICD-10-CM

## 2013-07-15 DIAGNOSIS — L97909 Non-pressure chronic ulcer of unspecified part of unspecified lower leg with unspecified severity: Principal | ICD-10-CM

## 2013-07-15 LAB — HEPATIC FUNCTION PANEL
ALT: 15 U/L (ref 0–35)
AST: 22 U/L (ref 0–37)
Albumin: 3.9 g/dL (ref 3.5–5.2)
Alkaline Phosphatase: 58 U/L (ref 39–117)
Bilirubin, Direct: 0.1 mg/dL (ref 0.0–0.3)
Indirect Bilirubin: 0.3 mg/dL (ref 0.2–1.2)
Total Bilirubin: 0.4 mg/dL (ref 0.2–1.2)
Total Protein: 7.1 g/dL (ref 6.0–8.3)

## 2013-07-15 LAB — BASIC METABOLIC PANEL
BUN: 9 mg/dL (ref 6–23)
CO2: 34 mEq/L — ABNORMAL HIGH (ref 19–32)
Calcium: 9.9 mg/dL (ref 8.4–10.5)
Chloride: 99 mEq/L (ref 96–112)
Creat: 0.7 mg/dL (ref 0.50–1.10)
Glucose, Bld: 95 mg/dL (ref 70–99)
Potassium: 3.4 mEq/L — ABNORMAL LOW (ref 3.5–5.3)
Sodium: 138 mEq/L (ref 135–145)

## 2013-07-15 LAB — HEMOGLOBIN A1C
Hgb A1c MFr Bld: 5.7 % — ABNORMAL HIGH (ref <5.7)
Mean Plasma Glucose: 117 mg/dL — ABNORMAL HIGH (ref <117)

## 2013-07-15 MED ORDER — ERGOCALCIFEROL 1.25 MG (50000 UT) PO CAPS: 50000.0000 [IU] | ORAL_CAPSULE | ORAL | Status: DC

## 2013-07-15 MED ORDER — FLUCONAZOLE 150 MG PO TABS: ORAL_TABLET | ORAL | Status: DC

## 2013-07-15 MED ORDER — DOXYCYCLINE HYCLATE 100 MG PO TABS: 100.0000 mg | ORAL_TABLET | Freq: Two times a day (BID) | ORAL | Status: DC

## 2013-07-15 MED ORDER — PHENTERMINE HCL 37.5 MG PO TABS: 37.5000 mg | ORAL_TABLET | Freq: Every day | ORAL | Status: DC

## 2013-07-15 MED ORDER — TERBINAFINE HCL 250 MG PO TABS: 250.0000 mg | ORAL_TABLET | Freq: Every day | ORAL | Status: DC

## 2013-07-15 NOTE — Progress Notes (Signed)
   Subjective:    Patient ID: Suzanne Andrews, female    DOB: 1965/12/05, 48 y.o.   MRN: [id number]  HPI The PT is here for follow up and re-evaluation of chronic medical conditions, medication management and review of any available recent lab and radiology data.  Preventive health is updated, specifically  Cancer screening and Immunization.   The PT denies any adverse reactions to current medications since the last visit. Has done well on phentermine wants to cotiniue 3 week h/o ulcer on right leg which is increasing in size, skin of legs and feet remains dry and itchy, toenails excessively thick and crumbly      Review of Systems    See HPI Denies recent fever or chills. Denies sinus pressure, nasal congestion, ear pain or sore throat. Denies chest congestion, productive cough or wheezing. Denies chest pains, palpitations and leg swelling Denies abdominal pain, nausea, vomiting,diarrhea or constipation.   Denies dysuria, frequency, hesitancy or incontinence. Denies joint pain, swelling and limitation in mobility. Denies headaches, seizures, numbness, or tingling. Denies depression, anxiety or insomnia.      Objective:   Physical Exam   Patient alert and oriented and in no cardiopulmonary distress.  HEENT: No facial asymmetry, EOMI, no sinus tenderness,  oropharynx pink and moist.  Neck supple no adenopathy.  Chest: Clear to auscultation bilaterally.  CVS: S1, S2 no murmurs, no S3.  ABD: Soft non tender. Bowel sounds normal.  Ext: one plus edema  MS: Adequate ROM spine, shoulders, hips and knees.  Skin: I Ulcer on right leg max diameter approx 4.5 cm, no visible purulent drainage Severe onychomycosis and tinea pedis  Psych: Good eye contact, normal affect. Memory intact not anxious or depressed appearing.  CNS: CN 2-12 intact, power, tone and sensation normal throughout.      Assessment & Plan:  Stasis leg ulcer Ulcer on right leg, antibiotc , pt to   start wearing compression hose also  Onychomycosis of toenail Severe bilateral all toes involved, oral med x 4 month  HYPERTENSION Controlled, no change in medication \\DASH  diet and commitment to daily physical activity for a minimum of 30 minutes discussed and encouraged, as a part of hypertension management. The importance of attaining a healthy weight is also discussed.   Prediabetes Improved. Patient educated about the importance of limiting  Carbohydrate intake , the need to commit to daily physical activity for a minimum of 30 minutes , and to commit weight loss. The fact that changes in all these areas will reduce or eliminate all together the development of diabetes is stressed.     OBESITY Improved. Pt applauded on succesful weight loss through lifestyle change, and encouraged to continue same. Weight loss goal set for the next several months.

## 2013-07-15 NOTE — Patient Instructions (Addendum)
CPE in 80months, call if you need me before Congrats on weight loss  Medication sent in for leg ulcer , clean with saline and apply dry gauze dressing twice daily till healed. Use moisturizing cream on your legs twice daily and get support hose prescribed for you. Take once weekly vit D prescribed please  New med for fungal toenail infection.  It is important that you exercise regularly at least 30 minutes 5 times a week. If you develop chest pain, have severe difficulty breathing, or feel very tired, stop exercising immediately and seek medical attention    A healthy diet is rich in fruit, vegetables and whole grains. Poultry fish, nuts and beans are a healthy choice for protein rather then red meat. A low sodium diet and drinking 64 ounces of water daily is generally recommended. Oils and sweet should be limited. Carbohydrates especially for those who are diabetic or overweight, should be limited to 45 to 60 gram per meal. It is important to eat on a regular schedule, at least 3 times daily. Snacks should be primarily fruits, vegetables or nuts.  Adequate rest, generally 6 to 8 hours per night is important for good health.Good sleep hygiene involves setting a regular bedtime, and turning off all sound and light in your sleep environment.Limiting caffeine intake will also help with the ability to rest well  Lab work needs to be done today, hepatic panel, hBA1C and chem 7. All medications need to be brought to every visit

## 2013-07-16 NOTE — Assessment & Plan Note (Signed)
Severe bilateral all toes involved, oral med x 4 month

## 2013-07-16 NOTE — Assessment & Plan Note (Signed)
Ulcer on right leg, antibiotc , pt to  start wearing compression hose also

## 2013-07-16 NOTE — Assessment & Plan Note (Signed)
Improved. Patient educated about the importance of limiting  Carbohydrate intake , the need to commit to daily physical activity for a minimum of 30 minutes , and to commit weight loss. The fact that changes in all these areas will reduce or eliminate all together the development of diabetes is stressed.

## 2013-07-16 NOTE — Assessment & Plan Note (Signed)
Improved. Pt applauded on succesful weight loss through lifestyle change, and encouraged to continue same. Weight loss goal set for the next several months.  

## 2013-07-16 NOTE — Assessment & Plan Note (Signed)
Controlled, no change in medication DASH diet and commitment to daily physical activity for a minimum of 30 minutes discussed and encouraged, as a part of hypertension management. The importance of attaining a healthy weight is also discussed.  

## 2013-08-01 ENCOUNTER — Telehealth: Payer: Self-pay | Admitting: Family Medicine

## 2013-08-01 ENCOUNTER — Other Ambulatory Visit: Payer: Self-pay | Admitting: Family Medicine

## 2013-08-01 NOTE — Telephone Encounter (Signed)
Patient aware.

## 2013-08-01 NOTE — Telephone Encounter (Signed)
Advise her to use tylenol 500mg  twice daily pls

## 2013-08-01 NOTE — Telephone Encounter (Signed)
Please advise.  Only noted that she was given abt

## 2013-08-03 ENCOUNTER — Telehealth: Payer: Self-pay

## 2013-08-03 MED ORDER — LEVOFLOXACIN 500 MG PO TABS: 500.0000 mg | ORAL_TABLET | Freq: Every day | ORAL | Status: DC

## 2013-08-03 MED ORDER — TRAMADOL HCL 50 MG PO TABS: 50.0000 mg | ORAL_TABLET | Freq: Every day | ORAL | Status: DC

## 2013-08-03 NOTE — Telephone Encounter (Signed)
If still draining needs levaquin 500mg  one daily for 5 days prescribed and also for the stinging pls send tramadol 50 mg one at night , as needed, for 10 days only. If the wound continues to be a problem she needs to see surgery or dermatology about this , she can  Call back I will refer  Pls send in scripts, you will need to enter them also

## 2013-08-03 NOTE — Telephone Encounter (Signed)
Patient aware and med sent. Will call back after completed and let us know if she needs referral

## 2013-08-03 NOTE — Telephone Encounter (Signed)
Patient is referring to leg wound.  Patient is having stinging from drainage.  Please advise.

## 2013-09-10 ENCOUNTER — Other Ambulatory Visit: Payer: Self-pay | Admitting: Family Medicine

## 2013-09-26 ENCOUNTER — Telehealth: Payer: Self-pay | Admitting: Family Medicine

## 2013-09-26 DIAGNOSIS — L309 Dermatitis, unspecified: Secondary | ICD-10-CM

## 2013-09-26 NOTE — Addendum Note (Signed)
Addended by: Eual Fines on: 09/26/2013 03:04 PM   Modules accepted: Orders

## 2013-09-26 NOTE — Telephone Encounter (Signed)
pls refer to derm Dr Hall/[place] re dermatitis  (leg) I will sign, let her know

## 2013-09-26 NOTE — Telephone Encounter (Signed)
referral entered

## 2013-09-26 NOTE — Telephone Encounter (Signed)
States the ulcer on her right leg has healed up and there is a scab but its still giving her problems, still some redness and its sore. Wants to see dermatology

## 2013-09-27 ENCOUNTER — Telehealth: Payer: Self-pay | Admitting: Family Medicine

## 2013-09-27 DIAGNOSIS — L97909 Non-pressure chronic ulcer of unspecified part of unspecified lower leg with unspecified severity: Secondary | ICD-10-CM

## 2013-09-28 NOTE — Telephone Encounter (Signed)
Referral entered  

## 2013-11-18 ENCOUNTER — Encounter: Payer: Medicaid Other | Admitting: Family Medicine

## 2013-11-24 ENCOUNTER — Telehealth: Payer: Self-pay

## 2013-11-24 DIAGNOSIS — M25562 Pain in left knee: Secondary | ICD-10-CM

## 2013-11-24 NOTE — Telephone Encounter (Signed)
Patient is requesting that appt be made as soon as possible.  She was told by Dr Briscoe Burns office that they have an opening for next week.  She is requesting to be seen after 4pm if possible.

## 2013-11-24 NOTE — Telephone Encounter (Signed)
Faxed over papers to Dr Briscoe Burns office confirmed that they did receive the paper work and they will schedule as soon as they can

## 2013-11-24 NOTE — Telephone Encounter (Signed)
Noted, thanks!

## 2014-03-09 ENCOUNTER — Other Ambulatory Visit: Payer: Self-pay | Admitting: Family Medicine

## 2014-03-15 ENCOUNTER — Other Ambulatory Visit: Payer: Self-pay | Admitting: Family Medicine

## 2014-04-20 ENCOUNTER — Other Ambulatory Visit (HOSPITAL_COMMUNITY): Payer: Self-pay | Admitting: Orthopaedic Surgery

## 2014-04-20 DIAGNOSIS — M25562 Pain in left knee: Secondary | ICD-10-CM

## 2014-04-25 ENCOUNTER — Ambulatory Visit (HOSPITAL_COMMUNITY)
Admission: RE | Admit: 2014-04-25 | Discharge: 2014-04-25 | Disposition: A | Discharge status: Home / Self Care | Payer: Self-pay | Source: Ambulatory Visit | Attending: Orthopaedic Surgery | Admitting: Orthopaedic Surgery

## 2014-04-25 DIAGNOSIS — I8392 Asymptomatic varicose veins of left lower extremity: Secondary | ICD-10-CM | POA: Insufficient documentation

## 2014-04-25 DIAGNOSIS — M7122 Synovial cyst of popliteal space [Baker], left knee: Secondary | ICD-10-CM | POA: Insufficient documentation

## 2014-04-25 DIAGNOSIS — M179 Osteoarthritis of knee, unspecified: Secondary | ICD-10-CM | POA: Insufficient documentation

## 2014-04-25 DIAGNOSIS — M25462 Effusion, left knee: Secondary | ICD-10-CM | POA: Insufficient documentation

## 2014-04-25 DIAGNOSIS — M25562 Pain in left knee: Secondary | ICD-10-CM

## 2014-05-15 ENCOUNTER — Other Ambulatory Visit: Payer: Self-pay | Admitting: Family Medicine

## 2014-07-03 ENCOUNTER — Telehealth: Payer: Self-pay | Admitting: Family Medicine

## 2014-07-03 DIAGNOSIS — M25562 Pain in left knee: Secondary | ICD-10-CM

## 2014-07-03 NOTE — Telephone Encounter (Signed)
Do you agree with this?

## 2014-07-03 NOTE — Telephone Encounter (Signed)
Referral entered  

## 2014-07-03 NOTE — Telephone Encounter (Signed)
tes pls enter I will sign

## 2014-07-03 NOTE — Addendum Note (Signed)
Addended by: Denman George B on: 07/03/2014 10:10 AM   Modules accepted: Orders

## 2014-07-05 ENCOUNTER — Other Ambulatory Visit: Payer: Self-pay | Admitting: Radiology

## 2014-07-06 ENCOUNTER — Other Ambulatory Visit: Payer: Self-pay | Admitting: Radiology

## 2014-07-12 ENCOUNTER — Other Ambulatory Visit: Payer: Self-pay

## 2014-07-12 ENCOUNTER — Encounter (HOSPITAL_COMMUNITY)
Admission: RE | Admit: 2014-07-12 | Discharge: 2014-07-12 | Disposition: A | Discharge status: Home / Self Care | Payer: 59 | Source: Ambulatory Visit | Attending: Orthopaedic Surgery | Admitting: Orthopaedic Surgery

## 2014-07-12 ENCOUNTER — Encounter (HOSPITAL_COMMUNITY): Payer: Self-pay

## 2014-07-12 DIAGNOSIS — M23207 Derangement of unspecified meniscus due to old tear or injury, left knee: Secondary | ICD-10-CM | POA: Insufficient documentation

## 2014-07-12 DIAGNOSIS — Z01818 Encounter for other preprocedural examination: Secondary | ICD-10-CM | POA: Insufficient documentation

## 2014-07-12 LAB — CBC WITH DIFFERENTIAL/PLATELET
Basophils Absolute: 0 10*3/uL (ref 0.0–0.1)
Basophils Relative: 1 % (ref 0–1)
Eosinophils Absolute: 0.4 10*3/uL (ref 0.0–0.7)
Eosinophils Relative: 6 % — ABNORMAL HIGH (ref 0–5)
HCT: 36.9 % (ref 36.0–46.0)
Hemoglobin: 12.1 g/dL (ref 12.0–15.0)
Lymphocytes Relative: 35 % (ref 12–46)
Lymphs Abs: 2.1 10*3/uL (ref 0.7–4.0)
MCH: 29.8 pg (ref 26.0–34.0)
MCHC: 32.8 g/dL (ref 30.0–36.0)
MCV: 90.9 fL (ref 78.0–100.0)
Monocytes Absolute: 0.7 10*3/uL (ref 0.1–1.0)
Monocytes Relative: 11 % (ref 3–12)
Neutro Abs: 2.9 10*3/uL (ref 1.7–7.7)
Neutrophils Relative %: 47 % (ref 43–77)
Platelets: 296 10*3/uL (ref 150–400)
RBC: 4.06 MIL/uL (ref 3.87–5.11)
RDW: 13.6 % (ref 11.5–15.5)
WBC: 6.1 10*3/uL (ref 4.0–10.5)

## 2014-07-12 LAB — COMPREHENSIVE METABOLIC PANEL
ALT: 17 U/L (ref 0–35)
AST: 21 U/L (ref 0–37)
Albumin: 4 g/dL (ref 3.5–5.2)
Alkaline Phosphatase: 52 U/L (ref 39–117)
Anion gap: 6 (ref 5–15)
BUN: 15 mg/dL (ref 6–23)
CO2: 28 mmol/L (ref 19–32)
Calcium: 9.2 mg/dL (ref 8.4–10.5)
Chloride: 101 mmol/L (ref 96–112)
Creatinine, Ser: 0.69 mg/dL (ref 0.50–1.10)
GFR calc Af Amer: >90 mL/min (ref >90)
GFR calc non Af Amer: >90 mL/min (ref >90)
Glucose, Bld: 89 mg/dL (ref 70–99)
Potassium: 3.7 mmol/L (ref 3.5–5.1)
Sodium: 135 mmol/L (ref 135–145)
Total Bilirubin: 0.8 mg/dL (ref 0.3–1.2)
Total Protein: 7.4 g/dL (ref 6.0–8.3)

## 2014-07-12 LAB — URINALYSIS, ROUTINE W REFLEX MICROSCOPIC
Bilirubin Urine: NEGATIVE
Glucose, UA: NEGATIVE mg/dL
Hgb urine dipstick: NEGATIVE
Ketones, ur: NEGATIVE mg/dL
Leukocytes, UA: NEGATIVE
Nitrite: NEGATIVE
Protein, ur: NEGATIVE mg/dL
Specific Gravity, Urine: 1.02 (ref 1.005–1.030)
Urobilinogen, UA: 0.2 mg/dL (ref 0.0–1.0)
pH: 5.5 (ref 5.0–8.0)

## 2014-07-12 NOTE — Patient Instructions (Signed)
Suzanne Andrews  07/12/2014   Your procedure is scheduled on:   07/18/2014  Report to [facility] at  98  AM.  Call this number if you have problems the morning of surgery: [phone]   Remember:   Do not eat food or drink liquids after midnight.   Take these medicines the morning of surgery with A SIP OF WATER:  Lisinopril,ultram   Do not wear jewelry, make-up or nail polish.  Do not wear lotions, powders, or perfumes.   Do not shave 48 hours prior to surgery. Men may shave face and neck.  Do not bring valuables to the hospital.  [facility] is not responsible for any belongings or valuables.               Contacts, dentures or bridgework may not be worn into surgery.  Leave suitcase in the car. After surgery it may be brought to your room.  For patients admitted to the hospital, discharge time is determined by your treatment team.               Patients discharged the day of surgery will not be allowed to drive home.  Name and phone number of your driver: family  Special Instructions: Shower using CHG 2 nights before surgery and the night before surgery.  If you shower the day of surgery use CHG.  Use special wash - you have one bottle of CHG for all showers.  You should use approximately 1/3 of the bottle for each shower.   Please read over the following fact sheets that you were given: Pain Booklet, Coughing and Deep Breathing, Surgical Site Infection Prevention, Anesthesia Post-op Instructions and Care and Recovery After Surgery Arthroscopic Procedure, Knee An arthroscopic procedure can find what is wrong with your knee. PROCEDURE Arthroscopy is a surgical technique that allows your orthopedic surgeon to diagnose and treat your knee injury with accuracy. They will look into your knee through a small instrument. This is almost like a small (pencil sized) telescope. Because arthroscopy affects your knee less than open knee surgery, you can anticipate a more rapid recovery.  Taking an active role by following your caregiver's instructions will help with rapid and complete recovery. Use crutches, rest, elevation, ice, and knee exercises as instructed. The length of recovery depends on various factors including type of injury, age, physical condition, medical conditions, and your rehabilitation. Your knee is the joint between the large bones (femur and tibia) in your leg. Cartilage covers these bone ends which are smooth and slippery and allow your knee to bend and move smoothly. Two menisci, thick, semi-lunar shaped pads of cartilage which form a rim inside the joint, help absorb shock and stabilize your knee. Ligaments bind the bones together and support your knee joint. Muscles move the joint, help support your knee, and take stress off the joint itself. Because of this all programs and physical therapy to rehabilitate an injured or repaired knee require rebuilding and strengthening your muscles. AFTER THE PROCEDURE  After the procedure, you will be moved to a recovery area until most of the effects of the medication have worn off. Your caregiver will discuss the test results with you.  Only take over-the-counter or prescription medicines for pain, discomfort, or fever as directed by your caregiver. SEEK MEDICAL CARE IF:   You have increased bleeding from your wounds.  You see redness, swelling, or have increasing pain in your wounds.  You have pus coming from  your wound.  You have an oral temperature above 102 F (38.9 C).  You notice a bad smell coming from the wound or dressing.  You have severe pain with any motion of your knee. SEEK IMMEDIATE MEDICAL CARE IF:   You develop a rash.  You have difficulty breathing.  You have any allergic problems. Document Released: 05/16/2000 Document Revised: 08/11/2011 Document Reviewed: 12/08/2007 [facility] Patient Information 2015 [place], [place]. This information is not intended to replace advice given to you by your  health care provider. Make sure you discuss any questions you have with your health care provider. PATIENT INSTRUCTIONS POST-ANESTHESIA  IMMEDIATELY FOLLOWING SURGERY:  Do not drive or operate machinery for the first twenty four hours after surgery.  Do not make any important decisions for twenty four hours after surgery or while taking narcotic pain medications or sedatives.  If you develop intractable nausea and vomiting or a severe headache please notify your doctor immediately.  FOLLOW-UP:  Please make an appointment with your surgeon as instructed. You do not need to follow up with anesthesia unless specifically instructed to do so.  WOUND CARE INSTRUCTIONS (if applicable):  Keep a dry clean dressing on the anesthesia/puncture wound site if there is drainage.  Once the wound has quit draining you may leave it open to air.  Generally you should leave the bandage intact for twenty four hours unless there is drainage.  If the epidural site drains for more than 36-48 hours please call the anesthesia department.  QUESTIONS?:  Please feel free to call your physician or the hospital operator if you have any questions, and they will be happy to assist you.

## 2014-07-12 NOTE — Pre-Procedure Instructions (Signed)
Patient given information to sign up for my chart at home. 

## 2014-07-13 ENCOUNTER — Inpatient Hospital Stay (HOSPITAL_COMMUNITY): Admission: RE | Admit: 2014-07-13 | Payer: 59 | Source: Ambulatory Visit

## 2014-07-17 NOTE — H&P (Signed)
Suzanne Andrews is an 49 y.o. female.   Chief Complaint: Left knee pain HPI: She has a long history of bilateral knee pain, more on the left than the right.  She has swelling, popping, decreased motion, but no trauma.  She has had some giving way of the left knee.  She had a MRI of the left knee on 04/25/14 showing markedly severe DJD of the left knee in all three compartments, ACL not visualized thought torn, degenerative tearing and tears of the medial meniscus, large knee effusion and large Bakers cyst.  She has not improved with conservative therapy.  She is a candidate for a total knee.  I have told her about arthroscopy which could debried the knee and the medial meniscus but eventually she will need a total knee.  The arthroscopy could aggravate or flair-up the degenerative changes she has.  She does not want a total knee at this time.  She has agreed to the arthroscopy of the knee at this time.  I have gone over the risks and imponderables which she understands.  She knows the arthroscopy will not correct the arthritis in the knee.  She knows she will need physical therapy after surgery.  Past Medical History  Diagnosis Date  . Hypertension     Past Surgical History  Procedure Laterality Date  . Cesarean section      x 2  . Vascular surgery bilateral leg    . Tubal ligation    . Cholecystectomy      Family History  Problem Relation Age of Onset  . Hypertension Mother   . Cancer Father   . Hypertension Sister   . Cancer Sister     breast  . Cancer Brother   . Hypertension Brother   . Cancer Brother    Social History:  reports that she has never smoked. She does not have any smokeless tobacco history on file. She reports that she does not drink alcohol or use illicit drugs.  Allergies: No Known Allergies  No prescriptions prior to admission    No results found for this or any previous visit (from the past 48 hour(s)). No results found.  Review of Systems    Cardiovascular:       History of hypertension  Musculoskeletal: Positive for joint pain (Pain both knees, more on the left with effusions).  Endo/Heme/Allergies:       Pre-diabetes    There were no vitals taken for this visit. Physical Exam  Constitutional: She is oriented to person, place, and time. She appears well-developed and well-nourished.  HENT:  Head: Normocephalic and atraumatic.  Eyes: Conjunctivae and EOM are normal. Pupils are equal, round, and reactive to light.  Neck: Normal range of motion. Neck supple.  Cardiovascular: Normal rate, regular rhythm, normal heart sounds and intact distal pulses.   Respiratory: Effort normal.  GI: Soft.  Musculoskeletal: She exhibits tenderness (Pain left knee with large effusion, Bakers cyst is present, pain more medially with crepitus and posittive medial McMurray.  ROM is 0 to 100.).       Left knee: She exhibits decreased range of motion, swelling and effusion. Tenderness found. Medial joint line tenderness noted.       Legs: Neurological: She is alert and oriented to person, place, and time. She has normal reflexes.  Skin: Skin is warm and dry.  Psychiatric: She has a normal mood and affect. Her behavior is normal. Judgment and thought content normal.     Assessment/Plan Severe  DJD of the left knee with tear of the medial meniscus.  For arthroscopy of the left knee.  Suzanne Andrews 07/17/2014, 9:01 AM

## 2014-07-18 ENCOUNTER — Ambulatory Visit (HOSPITAL_COMMUNITY): Payer: 59 | Admitting: Anesthesiology

## 2014-07-18 ENCOUNTER — Ambulatory Visit (HOSPITAL_COMMUNITY)
Admission: RE | Admit: 2014-07-18 | Discharge: 2014-07-18 | Disposition: A | Discharge status: Home / Self Care | Payer: 59 | Source: Ambulatory Visit | Attending: Orthopaedic Surgery | Admitting: Orthopaedic Surgery

## 2014-07-18 ENCOUNTER — Encounter (HOSPITAL_COMMUNITY): Payer: Self-pay | Admitting: *Deleted

## 2014-07-18 ENCOUNTER — Encounter (HOSPITAL_COMMUNITY)
Admission: RE | Disposition: A | Discharge status: Home / Self Care | Payer: Self-pay | Source: Ambulatory Visit | Attending: Orthopaedic Surgery

## 2014-07-18 DIAGNOSIS — M23204 Derangement of unspecified medial meniscus due to old tear or injury, left knee: Secondary | ICD-10-CM | POA: Insufficient documentation

## 2014-07-18 DIAGNOSIS — Z79899 Other long term (current) drug therapy: Secondary | ICD-10-CM | POA: Insufficient documentation

## 2014-07-18 DIAGNOSIS — M23201 Derangement of unspecified lateral meniscus due to old tear or injury, left knee: Secondary | ICD-10-CM | POA: Insufficient documentation

## 2014-07-18 DIAGNOSIS — E119 Type 2 diabetes mellitus without complications: Secondary | ICD-10-CM | POA: Insufficient documentation

## 2014-07-18 DIAGNOSIS — M1712 Unilateral primary osteoarthritis, left knee: Secondary | ICD-10-CM | POA: Insufficient documentation

## 2014-07-18 DIAGNOSIS — I10 Essential (primary) hypertension: Secondary | ICD-10-CM | POA: Insufficient documentation

## 2014-07-18 HISTORY — PX: KNEE ARTHROSCOPY WITH MEDIAL MENISECTOMY: SHX5651

## 2014-07-18 LAB — GLUCOSE, CAPILLARY: Glucose-Capillary: 85 mg/dL (ref 70–99)

## 2014-07-18 SURGERY — ARTHROSCOPY, KNEE, WITH MEDIAL MENISCECTOMY
Anesthesia: General | Site: Knee | Laterality: Left

## 2014-07-18 MED ORDER — FENTANYL CITRATE 0.05 MG/ML IJ SOLN
INTRAMUSCULAR | Status: AC
  Filled 2014-07-18: qty 2

## 2014-07-18 MED ORDER — MIDAZOLAM HCL 2 MG/2ML IJ SOLN
1.0000 mg | INTRAMUSCULAR | Status: DC | PRN
  Administered 2014-07-18 (×2): 2 mg via INTRAVENOUS
  Filled 2014-07-18 (×2): qty 2

## 2014-07-18 MED ORDER — ONDANSETRON HCL 4 MG/2ML IJ SOLN: 4.0000 mg | Freq: Once | INTRAMUSCULAR | Status: DC | PRN

## 2014-07-18 MED ORDER — DEXAMETHASONE SODIUM PHOSPHATE 4 MG/ML IJ SOLN
INTRAMUSCULAR | Status: DC | PRN
  Administered 2014-07-18: 8 mg via INTRAVENOUS

## 2014-07-18 MED ORDER — FENTANYL CITRATE 0.05 MG/ML IJ SOLN
25.0000 ug | INTRAMUSCULAR | Status: AC
  Administered 2014-07-18 (×2): 25 ug via INTRAVENOUS
  Filled 2014-07-18: qty 2

## 2014-07-18 MED ORDER — LIDOCAINE HCL (CARDIAC) 20 MG/ML IV SOLN
INTRAVENOUS | Status: DC | PRN
  Administered 2014-07-18: 50 mg via INTRAVENOUS

## 2014-07-18 MED ORDER — FENTANYL CITRATE 0.05 MG/ML IJ SOLN: 25.0000 ug | INTRAMUSCULAR | Status: DC | PRN

## 2014-07-18 MED ORDER — FENTANYL CITRATE 0.05 MG/ML IJ SOLN
INTRAMUSCULAR | Status: DC | PRN
  Administered 2014-07-18: 50 ug via INTRAVENOUS
  Administered 2014-07-18: 100 ug via INTRAVENOUS
  Administered 2014-07-18: 50 ug via INTRAVENOUS

## 2014-07-18 MED ORDER — HYDROCODONE-ACETAMINOPHEN 7.5-325 MG PO TABS: 1.0000 | ORAL_TABLET | ORAL | Status: DC | PRN

## 2014-07-18 MED ORDER — LACTATED RINGERS IR SOLN
Status: DC | PRN
  Administered 2014-07-18: 9000 mL

## 2014-07-18 MED ORDER — DEXAMETHASONE SODIUM PHOSPHATE 4 MG/ML IJ SOLN
INTRAMUSCULAR | Status: AC
  Filled 2014-07-18: qty 2

## 2014-07-18 MED ORDER — PROPOFOL 10 MG/ML IV BOLUS
INTRAVENOUS | Status: DC | PRN
  Administered 2014-07-18: 140 mg via INTRAVENOUS

## 2014-07-18 MED ORDER — LIDOCAINE HCL (PF) 1 % IJ SOLN
INTRAMUSCULAR | Status: AC
  Filled 2014-07-18: qty 5

## 2014-07-18 MED ORDER — LACTATED RINGERS IV SOLN
INTRAVENOUS | Status: DC | PRN
  Administered 2014-07-18 (×2): via INTRAVENOUS

## 2014-07-18 MED ORDER — BUPIVACAINE HCL (PF) 0.25 % IJ SOLN
INTRAMUSCULAR | Status: DC | PRN
  Administered 2014-07-18: 30 mL

## 2014-07-18 MED ORDER — FENTANYL CITRATE 0.05 MG/ML IJ SOLN
INTRAMUSCULAR | Status: AC
Start: 2014-07-18 — End: 2014-07-18
  Filled 2014-07-18: qty 2

## 2014-07-18 MED ORDER — ONDANSETRON HCL 4 MG/2ML IJ SOLN
4.0000 mg | Freq: Once | INTRAMUSCULAR | Status: AC
  Administered 2014-07-18: 4 mg via INTRAVENOUS
  Filled 2014-07-18: qty 2

## 2014-07-18 MED ORDER — CHLORHEXIDINE GLUCONATE 4 % EX LIQD
60.0000 mL | Freq: Once | CUTANEOUS | Status: DC
Start: 2014-07-18 — End: 2014-07-18

## 2014-07-18 MED ORDER — PROPOFOL 10 MG/ML IV BOLUS
INTRAVENOUS | Status: AC
  Filled 2014-07-18: qty 20

## 2014-07-18 MED ORDER — BUPIVACAINE HCL (PF) 0.25 % IJ SOLN
INTRAMUSCULAR | Status: AC
  Filled 2014-07-18: qty 30

## 2014-07-18 MED ORDER — SODIUM CHLORIDE 0.9 % IR SOLN
Status: DC | PRN
  Administered 2014-07-18: 1000 mL

## 2014-07-18 MED ORDER — ROCURONIUM BROMIDE 50 MG/5ML IV SOLN
INTRAVENOUS | Status: AC
  Filled 2014-07-18: qty 1

## 2014-07-18 MED ORDER — LACTATED RINGERS IV SOLN
INTRAVENOUS | Status: DC
  Administered 2014-07-18: 07:00:00 via INTRAVENOUS

## 2014-07-18 MED ORDER — SUCCINYLCHOLINE CHLORIDE 20 MG/ML IJ SOLN
INTRAMUSCULAR | Status: AC
  Filled 2014-07-18: qty 1

## 2014-07-18 SURGICAL SUPPLY — 58 items
BAG HAMPER (MISCELLANEOUS) ×3 IMPLANT
BANDAGE ELASTIC 6 VELCRO NS (GAUZE/BANDAGES/DRESSINGS) ×3 IMPLANT
BANDAGE ESMARK 4X12 BL STRL LF (DISPOSABLE) ×2 IMPLANT
BLADE SURG 15 STRL LF DISP TIS (BLADE) ×2 IMPLANT
BLADE SURG 15 STRL SS (BLADE) ×1
BLADE SURG SZ11 CARB STEEL (BLADE) ×3 IMPLANT
BNDG ESMARK 4X12 BLUE STRL LF (DISPOSABLE) ×3
CLOTH BEACON ORANGE TIMEOUT ST (SAFETY) ×3 IMPLANT
CUFF TOURNIQUET SINGLE 34IN LL (TOURNIQUET CUFF) IMPLANT
CUFF TOURNIQUET SINGLE 44IN (TOURNIQUET CUFF) ×3 IMPLANT
CUTTER TOMCAT 4.0M (BURR) ×3 IMPLANT
CUTTER TOMCAT 5.0MM (BURR) ×3 IMPLANT
DECANTER SPIKE VIAL GLASS SM (MISCELLANEOUS) ×3 IMPLANT
DRAPE PROXIMA HALF (DRAPES) ×3 IMPLANT
DRSG XEROFORM 1X8 (GAUZE/BANDAGES/DRESSINGS) ×3 IMPLANT
DURAPREP 26ML APPLICATOR (WOUND CARE) ×6 IMPLANT
ELECT MENISCUS 165MM 90D (ELECTRODE) IMPLANT
FORMALIN 10 PREFIL 480ML (MISCELLANEOUS) ×3 IMPLANT
GAUZE SPONGE 4X4 12PLY STRL (GAUZE/BANDAGES/DRESSINGS) ×3 IMPLANT
GAUZE SPONGE 4X4 16PLY XRAY LF (GAUZE/BANDAGES/DRESSINGS) ×3 IMPLANT
GAUZE XEROFORM 5X9 LF (GAUZE/BANDAGES/DRESSINGS) ×3 IMPLANT
GLOVE BIO SURGEON STRL SZ8 (GLOVE) ×3 IMPLANT
GLOVE BIO SURGEON STRL SZ8.5 (GLOVE) ×3 IMPLANT
GLOVE BIOGEL PI IND STRL 7.5 (GLOVE) ×2 IMPLANT
GLOVE BIOGEL PI INDICATOR 7.5 (GLOVE) ×1
GLOVE EXAM NITRILE MD LF STRL (GLOVE) ×3 IMPLANT
GLOVE SURG SS PI 7.5 STRL IVOR (GLOVE) ×3 IMPLANT
GOWN STRL REUS W/TWL LRG LVL3 (GOWN DISPOSABLE) ×3 IMPLANT
GOWN STRL REUS W/TWL XL LVL3 (GOWN DISPOSABLE) ×3 IMPLANT
HANDPIECE (MISCELLANEOUS) IMPLANT
HLDR LEG FOAM (MISCELLANEOUS) ×2 IMPLANT
IV LACTATED RINGER IRRG 3000ML (IV SOLUTION) ×3
IV LR IRRIG 3000ML ARTHROMATIC (IV SOLUTION) ×6 IMPLANT
KIT BLADEGUARD II DBL (SET/KITS/TRAYS/PACK) ×3 IMPLANT
KIT ROOM TURNOVER AP CYSTO (KITS) ×3 IMPLANT
KNIFE HOOK (BLADE) IMPLANT
KNIFE MENISECTOMY (BLADE) IMPLANT
LEG HOLDER FOAM (MISCELLANEOUS) ×1
MANIFOLD NEPTUNE II (INSTRUMENTS) ×3 IMPLANT
MARKER SKIN DUAL TIP RULER LAB (MISCELLANEOUS) ×3 IMPLANT
NEEDLE HYPO 21X1.5 SAFETY (NEEDLE) ×3 IMPLANT
NEEDLE SPNL 18GX3.5 QUINCKE PK (NEEDLE) ×3 IMPLANT
NS IRRIG 1000ML POUR BTL (IV SOLUTION) ×3 IMPLANT
PACK ARTHRO LIMB DRAPE STRL (MISCELLANEOUS) ×3 IMPLANT
PAD ABD 5X9 TENDERSORB (GAUZE/BANDAGES/DRESSINGS) ×6 IMPLANT
PAD ARMBOARD 7.5X6 YLW CONV (MISCELLANEOUS) ×3 IMPLANT
PADDING CAST COTTON 6X4 STRL (CAST SUPPLIES) ×3 IMPLANT
PADDING WEBRIL 6 STERILE (GAUZE/BANDAGES/DRESSINGS) ×3 IMPLANT
SET ARTHROSCOPY INST (INSTRUMENTS) ×3 IMPLANT
SET BASIN LINEN APH (SET/KITS/TRAYS/PACK) ×3 IMPLANT
SPONGE GAUZE 4X4 12PLY (GAUZE/BANDAGES/DRESSINGS) ×3 IMPLANT
SUT ETHILON 3 0 FSL (SUTURE) ×3 IMPLANT
SYR 30ML LL (SYRINGE) ×3 IMPLANT
TIP 0 DEGREE (MISCELLANEOUS) IMPLANT
TIP 30 DEGREE (MISCELLANEOUS) IMPLANT
TIP 70 DEGREE (MISCELLANEOUS) IMPLANT
TUBING FLOSTEADY ARTHRO PUMP (IRRIGATION / IRRIGATOR) ×3 IMPLANT
YANKAUER SUCT BULB TIP 10FT TU (MISCELLANEOUS) ×6 IMPLANT

## 2014-07-18 NOTE — Op Note (Signed)
Suzanne Andrews, Suzanne Andrews              ACCOUNT NO.:  [id number]  MEDICAL RECORD NO.:  [id number]  LOCATION:  [facility]                          FACILITY:  [facility]  PHYSICIAN:  J. Sanjuana Kava, M.D. DATE OF BIRTH:  1965-11-12  DATE OF PROCEDURE: DATE OF DISCHARGE:                              OPERATIVE REPORT   PREOPERATIVE DIAGNOSIS:  Severe degenerative joint disease of the left knee, all three compartments, posterior medial meniscus.  POSTOPERATIVE DIAGNOSIS:  Severe degenerative joint disease of the left knee, all three compartments, posterior medial meniscus plus also tear of lateral meniscus.  ANESTHESIA:  General.  TOURNIQUET TIME:  22 minutes.  DRAINS:  None.  SURGEON:  J. Sanjuana Kava, M.D.  INDICATIONS:  The patient is a 49 year old female with severe degenerative joint disease of both knees, particularly the left.  She has long standing knee pain more on the right.  She has been followed in the office.  She has had giving way and swelling of the right knee not responding well to conservative treatment.  She had a MRI of the knee showing severe tricompartmental degenerative joint disease as well as a tear of the medial meniscus.  I have told her she is a candidate for a total knee replacement due to the severe degenerative changes. I have also talked about arthroscopy to remove the medial meniscus but the arthroscopy will no change her arthritis and it might even aggravate it.  I went over the risks and imponderables of the procedure.  She asked appropriate questions.  She has decided to have arthroscopy now as an elective procedure and see how she does before considering the total joint replacement.  DESCRIPTION OF PROCEDURE:  The patient was seen in the holding area and the left knee was identified as the correct site and a mark placed on the knee.  She was taken to the operating room and placed supine on the operating table.  A leg holder and tourniquet were applied to the left thigh.   She was given general anesthesia.  She was prepped and draped in the usual manner.  A timeout was called.  Suzanne Andrews was identified as the patient for left knee arthroscopy.  The operating team knew each other and all instrumentation was present and working.  The mark made earlier was still visible on the left knee.  An esmark bandage was used and then the tourniquet inflated to 300 mm/Hg.  The bandage removed.  An inflow canula was placed medially in the left knee near the patella.  Lactated Ringers was instilled via an infusion pump.  The arthroscope was inserted laterally and the knee was examined.  There was considerable synovitis in the suprapatellar area.  There were grade 4 changes of the patella undersurface more medially.  The medial compartment was examined. She had significant grade IV changes of both the distal femur and proximal tibia articular surfaces.  Small de brie was present.  She had a degenerative stellate tear of the medial meniscus.  The anterior cruciate was completely torn with bulbous stumps.  Laterally there was a small posterior rim tear and significant grade IV changes of the articular surfaces.  Small fragments were floating in  the knee and removed.  Using a meniscus punch and a meniscus power rimmer/aspirator the meniscus medially and then laterally was removed past the tears.  A good contour was established.  Permanent pictures were taken.  The knee was re-examined and no new pathology was seen.  The knee was irrigated with the remaining Lactated Ringers solution.  Each portal was closed with 3-0 nylon suture vertical mattress manner.  Marcaine 0.25% was instilled into each portal.  Sterile dressing applied.  She tolerated the procedure well and went to recovery in good condition.  DICTATION ENDS HERE          ______________________________ J. Sanjuana Kava, M.D.     JWK/MEDQ  D:  07/18/2014  T:  07/18/2014  Job:  [id number]

## 2014-07-18 NOTE — Progress Notes (Signed)
The History and Physical is unchanged. I have examined the patient. The patient is medically able to have surgery on the left knee . Tiamarie Furnari  

## 2014-07-18 NOTE — Anesthesia Preprocedure Evaluation (Signed)
Anesthesia Evaluation  Patient identified by MRN, date of birth, ID band Patient awake    Reviewed: Allergy & Precautions, NPO status , Patient's Chart, lab work & pertinent test results  Airway Mallampati: II  TM Distance: >3 FB     Dental  (+) Teeth Intact   Pulmonary neg pulmonary ROS,  breath sounds clear to auscultation        Cardiovascular hypertension, Pt. on medications Rhythm:Regular Rate:Normal     Neuro/Psych    GI/Hepatic negative GI ROS,   Endo/Other  diabetes (pre diabetes hx)  Renal/GU      Musculoskeletal   Abdominal   Peds  Hematology   Anesthesia Other Findings   Reproductive/Obstetrics                             Anesthesia Physical Anesthesia Plan  ASA: II  Anesthesia Plan: General   Post-op Pain Management:    Induction: Intravenous  Airway Management Planned: LMA  Additional Equipment:   Intra-op Plan:   Post-operative Plan: Extubation in OR  Informed Consent: I have reviewed the patients History and Physical, chart, labs and discussed the procedure including the risks, benefits and alternatives for the proposed anesthesia with the patient or authorized representative who has indicated his/her understanding and acceptance.     Plan Discussed with:   Anesthesia Plan Comments:         Anesthesia Quick Evaluation

## 2014-07-18 NOTE — Anesthesia Postprocedure Evaluation (Signed)
  Anesthesia Post-op Note  Patient: Suzanne Andrews  Procedure(s) Performed: Procedure(s): LEFT KNEE ARTHROSCOPY, PARTIAL LATERAL AND MEDIAL MENISECTOMY (Left)  Patient Location: PACU  Anesthesia Type:General  Level of Consciousness: awake, alert , oriented and patient cooperative  Airway and Oxygen Therapy: Patient Spontanous Breathing  Post-op Pain: mild  Post-op Assessment: Post-op Vital signs reviewed, Patient's Cardiovascular Status Stable, Respiratory Function Stable, Patent Airway, No signs of Nausea or vomiting, Adequate PO intake, Pain level controlled, No headache, No backache, No residual numbness and No residual motor weakness  Post-op Vital Signs: Reviewed and stable  Last Vitals:  Filed Vitals:   07/18/14 0646  Temp: 16.3 C    Complications: No apparent anesthesia complications

## 2014-07-18 NOTE — Discharge Instructions (Signed)
Use walker or crutches as needed.  You may bear weight on the left leg as tolerated.  Use ice to the knee.  Elevate.  You may remove dressing when you desire.  You may cleanse wounds with peroxide.  Otherwise keep wound dry.  You may apply Band-aids over the stitches.  Keep physical therapy appointment.  Keep appointment to see Dr. Luna Glasgow in about 10 days.  If any problem, call his office at [phone], or if after hours, the hospital at [phone].

## 2014-07-18 NOTE — Transfer of Care (Signed)
Immediate Anesthesia Transfer of Care Note  Patient: Suzanne Andrews  Procedure(s) Performed: Procedure(s): LEFT KNEE ARTHROSCOPY, PARTIAL LATERAL AND MEDIAL MENISECTOMY (Left)  Patient Location: PACU  Anesthesia Type:General  Level of Consciousness: awake, alert , oriented and patient cooperative  Airway & Oxygen Therapy: Patient Spontanous Breathing and Patient connected to nasal cannula oxygen  Post-op Assessment: Report given to RN and Post -op Vital signs reviewed and stable  Post vital signs: Reviewed and stable  Last Vitals:  Filed Vitals:   07/18/14 0646  Temp: 88.8 C    Complications: No apparent anesthesia complications

## 2014-07-18 NOTE — Brief Op Note (Signed)
07/18/2014  8:27 AM  PATIENT:  Suzanne Andrews  49 y.o. female  PRE-OPERATIVE DIAGNOSIS:  tear left knee medial meniscus  POST-OPERATIVE DIAGNOSIS:  tear left knee medial and lateral meniscus  PROCEDURE:  Procedure(s): ARTHROSCOPY KNEE (Left)  SURGEON:  Surgeon(s) and Role:    * Sanjuana Kava, MD - Primary  PHYSICIAN ASSISTANT:   ASSISTANTS: none   ANESTHESIA:   general  EBL:  Total I/O In: 1000 [I.V.:1000] Out: -   BLOOD ADMINISTERED:none  DRAINS: none   LOCAL MEDICATIONS USED:  MARCAINE     SPECIMEN:  Source of Specimen:  Left knee medial and lateral meniscus shavings  DISPOSITION OF SPECIMEN:  PATHOLOGY  COUNTS:  YES  TOURNIQUET:  22 minutes  DICTATION: .Other Dictation: Dictation Number [id number]  PLAN OF CARE: Discharge to home after PACU  PATIENT DISPOSITION:  PACU - hemodynamically stable.   Delay start of Pharmacological VTE agent (>24hrs) due to surgical blood loss or risk of bleeding: not applicable

## 2014-07-19 ENCOUNTER — Encounter (HOSPITAL_COMMUNITY): Payer: Self-pay | Admitting: Orthopaedic Surgery

## 2014-08-22 ENCOUNTER — Ambulatory Visit (INDEPENDENT_AMBULATORY_CARE_PROVIDER_SITE_OTHER): Payer: 59 | Admitting: Family Medicine

## 2014-08-22 ENCOUNTER — Encounter: Payer: Self-pay | Admitting: Family Medicine

## 2014-08-22 VITALS — BP 120/82 | HR 86 | Resp 16 | Ht 67.0 in | Wt 280.0 lb

## 2014-08-22 DIAGNOSIS — Z23 Encounter for immunization: Secondary | ICD-10-CM | POA: Diagnosis not present

## 2014-08-22 DIAGNOSIS — R7309 Other abnormal glucose: Secondary | ICD-10-CM

## 2014-08-22 DIAGNOSIS — I10 Essential (primary) hypertension: Secondary | ICD-10-CM | POA: Diagnosis not present

## 2014-08-22 DIAGNOSIS — Z79899 Other long term (current) drug therapy: Secondary | ICD-10-CM | POA: Diagnosis not present

## 2014-08-22 DIAGNOSIS — M170 Bilateral primary osteoarthritis of knee: Secondary | ICD-10-CM

## 2014-08-22 DIAGNOSIS — Z1231 Encounter for screening mammogram for malignant neoplasm of breast: Secondary | ICD-10-CM

## 2014-08-22 DIAGNOSIS — E669 Obesity, unspecified: Secondary | ICD-10-CM

## 2014-08-22 DIAGNOSIS — R7303 Prediabetes: Secondary | ICD-10-CM

## 2014-08-22 MED ORDER — LISINOPRIL-HYDROCHLOROTHIAZIDE 20-25 MG PO TABS: 1.0000 | ORAL_TABLET | Freq: Every day | ORAL | Status: DC

## 2014-08-22 MED ORDER — PHENTERMINE HCL 37.5 MG PO TABS: 37.5000 mg | ORAL_TABLET | Freq: Every day | ORAL | Status: DC

## 2014-08-22 MED ORDER — ERGOCALCIFEROL 1.25 MG (50000 UT) PO CAPS: 50000.0000 [IU] | ORAL_CAPSULE | ORAL | Status: DC

## 2014-08-22 NOTE — Patient Instructions (Addendum)
CPE in 3 month, call if you need me before  1500 cal diet sheet is given to help you  Nutrition and exercise change as we discussed  Weight loss goal of 6 to 8 pounds per month  Resume once daily phentermine  Fasting lipid, HBA1C, TSH and Vit D in am  Flu vaccine today  All the best with the knee and your health   Mammogram is past due we will schedule

## 2014-08-28 ENCOUNTER — Ambulatory Visit (HOSPITAL_COMMUNITY): Payer: 59

## 2014-09-06 ENCOUNTER — Ambulatory Visit (HOSPITAL_COMMUNITY)
Admission: RE | Admit: 2014-09-06 | Discharge: 2014-09-06 | Disposition: A | Discharge status: Home / Self Care | Payer: 59 | Source: Ambulatory Visit | Attending: Family Medicine | Admitting: Family Medicine

## 2014-09-06 DIAGNOSIS — Z1231 Encounter for screening mammogram for malignant neoplasm of breast: Secondary | ICD-10-CM | POA: Diagnosis not present

## 2014-10-08 DIAGNOSIS — Z23 Encounter for immunization: Secondary | ICD-10-CM | POA: Insufficient documentation

## 2014-10-08 NOTE — Assessment & Plan Note (Signed)
Bilateral, lef worse than right has upcoming arthroscopy

## 2014-10-08 NOTE — Assessment & Plan Note (Signed)
Controlled, no change in medication DASH diet and commitment to daily physical activity for a minimum of 30 minutes discussed and encouraged, as a part of hypertension management. The importance of attaining a healthy weight is also discussed.  BP/Weight 08/22/2014 07/18/2014 07/12/2014 04/25/2014 07/15/2013 02/23/2013 3/56/7014  Systolic BP 103 013 143 - 888 757 972  Diastolic BP 82 90 67 - 80 72 74  Wt. (Lbs) 280 - 276 253 252 270.04 283  BMI 43.84 - 43.22 39.62 40.07 44.94 47.09

## 2014-10-08 NOTE — Progress Notes (Signed)
Subjective:    Patient ID: Suzanne Andrews, female    DOB: 07-13-1965, 49 y.o.   MRN: [id number]  HPI The PT is here for follow up and re-evaluation of chronic medical conditions, medication management and review of any available recent lab and radiology data.  Preventive health is updated, specifically  Cancer screening and Immunization.   Has upcoming left knee surgery for severe osteoarthritis. The PT denies any adverse reactions to current medications since the last visit.  Concerned about significant weight gain and wants to resume phentermine, will work on dietary change in particular as she recuperates from knee surgery    Review of Systems See HPI Denies recent fever or chills. Denies sinus pressure, nasal congestion, ear pain or sore throat. Denies chest congestion, productive cough or wheezing. Denies chest pains, palpitations and leg swelling Denies abdominal pain, nausea, vomiting,diarrhea or constipation.   Denies dysuria, frequency, hesitancy or incontinence. Denies headaches, seizures, numbness, or tingling. Denies depression, anxiety or insomnia. Denies skin break down or rash.        Objective:   Physical Exam BP 120/82 mmHg  Pulse 86  Resp 16  Ht 5\' 7"  (1.702 m)  Wt 280 lb (127.007 kg)  BMI 43.84 kg/m2  SpO2 99% Patient alert and oriented and in no cardiopulmonary distress.  HEENT: No facial asymmetry, EOMI,   oropharynx pink and moist.  Neck supple no JVD, no mass.  Chest: Clear to auscultation bilaterally.  CVS: S1, S2 no murmurs, no S3.Regular rate.  ABD: Soft non tender.   Ext: No edema  MS: Adequate ROM spine, shoulders, hips and markedly reduced in  Knees.left worse than right  Skin: Intact, no ulcerations or rash noted.  Psych: Good eye contact, normal affect. Memory intact not anxious or depressed appearing.  CNS: CN 2-12 intact, power,  normal throughout.no focal deficits noted.        Assessment & Plan:  Essential  hypertension Controlled, no change in medication DASH diet and commitment to daily physical activity for a minimum of 30 minutes discussed and encouraged, as a part of hypertension management. The importance of attaining a healthy weight is also discussed.  BP/Weight 08/22/2014 07/18/2014 07/12/2014 04/25/2014 07/15/2013 02/23/2013 06/04/129  Systolic BP 438 887 579 - 728 206 015  Diastolic BP 82 90 67 - 80 72 74  Wt. (Lbs) 280 - 276 253 252 270.04 283  BMI 43.84 - 43.22 39.62 40.07 44.94 47.09         Prediabetes  Updated lab needed at/ before next visit. Patient educated about the importance of limiting  Carbohydrate intake , the need to commit to daily physical activity for a minimum of 30 minutes , and to commit weight loss. The fact that changes in all these areas will reduce or eliminate all together the development of diabetes is stressed.   Diabetic Labs Latest Ref Rng 07/12/2014 07/15/2013 02/24/2013 02/12/2012 04/02/2011  HbA1c <5.7 % - 5.7(H) 6.0(H) - 6.0(H)  Chol 0 - 200 mg/dL - - 171 - -  HDL >39 mg/dL - - 60 - -  Calc LDL 0 - 99 mg/dL - - 102(H) - -  Triglycerides <150 mg/dL - - 46 - -  Creatinine 0.50 - 1.10 mg/dL 0.69 0.70 0.76 0.82 -   BP/Weight 08/22/2014 07/18/2014 07/12/2014 04/25/2014 07/15/2013 02/23/2013 11/14/3792  Systolic BP 327 614 709 - 295 747 340  Diastolic BP 82 90 67 - 80 72 74  Wt. (Lbs) 280 - 276 253 252 270.04 283  BMI 43.84 - 43.22 39.62 40.07 44.94 47.09   No flowsheet data found.      Morbid obesity . Deteriorated. Patient re-educated about  the importance of commitment to a  minimum of 150 minutes of exercise per week.  The importance of healthy food choices with portion control discussed. Encouraged to start a food diary, count calories and to consider  joining a support group. Sample diet sheets offered. Goals set by the patient for the next several months.   Weight /BMI 08/22/2014 07/12/2014 04/25/2014  WEIGHT 280 lb 276 lb 253 lb  HEIGHT  5\' 7"  5\' 7"  5\' 7"   BMI 43.84 kg/m2 43.22 kg/m2 39.62 kg/m2    Current exercise per week 60 minutes.Limited due to severe arthritis of the knee    Need for prophylactic vaccination and inoculation against influenza After obtaining informed consent, the vaccine is  administered by LPN.    Osteoarthritis, knee Bilateral, lef worse than right has upcoming arthroscopy

## 2014-10-08 NOTE — Assessment & Plan Note (Signed)
After obtaining informed consent, the vaccine is  administered by LPN.  

## 2014-10-08 NOTE — Assessment & Plan Note (Signed)
  Updated lab needed at/ before next visit. Patient educated about the importance of limiting  Carbohydrate intake , the need to commit to daily physical activity for a minimum of 30 minutes , and to commit weight loss. The fact that changes in all these areas will reduce or eliminate all together the development of diabetes is stressed.   Diabetic Labs Latest Ref Rng 07/12/2014 07/15/2013 02/24/2013 02/12/2012 04/02/2011  HbA1c <5.7 % - 5.7(H) 6.0(H) - 6.0(H)  Chol 0 - 200 mg/dL - - 171 - -  HDL >39 mg/dL - - 60 - -  Calc LDL 0 - 99 mg/dL - - 102(H) - -  Triglycerides <150 mg/dL - - 46 - -  Creatinine 0.50 - 1.10 mg/dL 0.69 0.70 0.76 0.82 -   BP/Weight 08/22/2014 07/18/2014 07/12/2014 04/25/2014 07/15/2013 02/23/2013 6/56/8127  Systolic BP 517 001 749 - 449 675 916  Diastolic BP 82 90 67 - 80 72 74  Wt. (Lbs) 280 - 276 253 252 270.04 283  BMI 43.84 - 43.22 39.62 40.07 44.94 47.09   No flowsheet data found.

## 2014-10-08 NOTE — Assessment & Plan Note (Signed)
.   Deteriorated. Patient re-educated about  the importance of commitment to a  minimum of 150 minutes of exercise per week.  The importance of healthy food choices with portion control discussed. Encouraged to start a food diary, count calories and to consider  joining a support group. Sample diet sheets offered. Goals set by the patient for the next several months.   Weight /BMI 08/22/2014 07/12/2014 04/25/2014  WEIGHT 280 lb 276 lb 253 lb  HEIGHT 5\' 7"  5\' 7"  5\' 7"   BMI 43.84 kg/m2 43.22 kg/m2 39.62 kg/m2    Current exercise per week 60 minutes.Limited due to severe arthritis of the knee

## 2014-11-30 ENCOUNTER — Telehealth: Payer: Self-pay

## 2014-11-30 DIAGNOSIS — M25562 Pain in left knee: Secondary | ICD-10-CM

## 2014-11-30 NOTE — Telephone Encounter (Signed)
Referral entered to Dr. Luna Glasgow

## 2015-01-10 ENCOUNTER — Encounter: Payer: 59 | Admitting: Family Medicine

## 2015-01-18 ENCOUNTER — Other Ambulatory Visit: Payer: Self-pay | Admitting: Family Medicine

## 2015-01-23 ENCOUNTER — Telehealth: Payer: Self-pay | Admitting: *Deleted

## 2015-01-23 NOTE — Telephone Encounter (Signed)
Pt called and now has medicaid and she needs another referral to Dr. Brooke Bonito office

## 2015-01-25 ENCOUNTER — Other Ambulatory Visit: Payer: Self-pay

## 2015-01-25 DIAGNOSIS — M170 Bilateral primary osteoarthritis of knee: Secondary | ICD-10-CM

## 2015-01-25 NOTE — Telephone Encounter (Signed)
Another referral entered since now has medicaid

## 2015-02-19 ENCOUNTER — Telehealth: Payer: Self-pay | Admitting: *Deleted

## 2015-02-19 NOTE — Telephone Encounter (Signed)
Pt called stating she is still having trouble with her right leg, she got a cut on it and the doctor at urgent care gave her a cream to put on it, pt said she is still having sharpe pains and she thinks she needs a antibiotic because she may have a infection. Please advise

## 2015-02-20 ENCOUNTER — Other Ambulatory Visit: Payer: Self-pay

## 2015-02-20 MED ORDER — CEPHALEXIN 500 MG PO CAPS: 500.0000 mg | ORAL_CAPSULE | Freq: Two times a day (BID) | ORAL | Status: DC

## 2015-02-20 NOTE — Telephone Encounter (Signed)
Pt aware.

## 2015-02-20 NOTE — Telephone Encounter (Signed)
Needs appt for pap overdue and no appt in system Offer flu vac Pls send keflex 500 mg one twice daily for 5 days only, advise oTC mionistat for vaginal yeast infection if she deveops this

## 2015-02-20 NOTE — Telephone Encounter (Signed)
States she has a cut on the top of her right foot. Not very big (smaller than her pinky finger) and she went to the urgent care b/c it was very tender and they gave her gel to use of it for a week. Its better but she still has sharp pains that shoot through it and especially in the night it burns bad and aches and she wants an antibiotic for it. No drainage. Please advise

## 2015-02-20 NOTE — Telephone Encounter (Signed)
Called patient and left message for them to return call at the office   

## 2015-02-28 ENCOUNTER — Ambulatory Visit (INDEPENDENT_AMBULATORY_CARE_PROVIDER_SITE_OTHER): Payer: Medicaid Other | Admitting: Family Medicine

## 2015-02-28 ENCOUNTER — Encounter: Payer: Self-pay | Admitting: Family Medicine

## 2015-02-28 VITALS — BP 124/84 | HR 86 | Resp 16 | Ht 67.0 in | Wt 272.0 lb

## 2015-02-28 DIAGNOSIS — Z13220 Encounter for screening for lipoid disorders: Secondary | ICD-10-CM

## 2015-02-28 DIAGNOSIS — S91309A Unspecified open wound, unspecified foot, initial encounter: Secondary | ICD-10-CM | POA: Insufficient documentation

## 2015-02-28 DIAGNOSIS — Z23 Encounter for immunization: Secondary | ICD-10-CM

## 2015-02-28 DIAGNOSIS — R7303 Prediabetes: Secondary | ICD-10-CM

## 2015-02-28 DIAGNOSIS — I10 Essential (primary) hypertension: Secondary | ICD-10-CM | POA: Diagnosis not present

## 2015-02-28 DIAGNOSIS — S91301S Unspecified open wound, right foot, sequela: Secondary | ICD-10-CM

## 2015-02-28 DIAGNOSIS — R7309 Other abnormal glucose: Secondary | ICD-10-CM

## 2015-02-28 MED ORDER — CLINDAMYCIN HCL 300 MG PO CAPS: 300.0000 mg | ORAL_CAPSULE | Freq: Three times a day (TID) | ORAL | Status: DC

## 2015-02-28 MED ORDER — LISINOPRIL-HYDROCHLOROTHIAZIDE 20-25 MG PO TABS: 1.0000 | ORAL_TABLET | Freq: Every day | ORAL | Status: DC

## 2015-02-28 NOTE — Progress Notes (Signed)
Subjective:    Patient ID: Suzanne Andrews, female    DOB: Sep 30, 1965, 49 y.o.   MRN: [id number]  HPI  Seen on 09/01 at [facility] with sore on right foot, was only opened and drained, getting better but still an open area which is painful. Pt has PVD No fever , chills or purulent drainage  Review of Systems See HPI Denies recent fever or chills. Denies sinus pressure, nasal congestion, ear pain or sore throat. Denies chest congestion, productive cough or wheezing. Denies chest pains, palpitations and leg swelling Denies abdominal pain, nausea, voDenies skin break down or rash.        Objective:   Physical Exam BP 124/84 mmHg  Pulse 86  Resp 16  Ht 5\' 7"  (1.702 m)  Wt 272 lb (123.378 kg)  BMI 42.59 kg/m2  SpO2 98% Patient alert and oriented and in no cardiopulmonary distress.  HEENT: No facial asymmetry, EOMI,   oropharynx pink and moist.  Neck supple no JVD, no mass.  Chest: Clear to auscultation bilaterally.  CVS: S1, S2 no murmurs, no S3.Regular rate.  ABD: Soft non tender.   Ext: No edema  MS: Adequate ROM spine, shoulders, hips and knees.  Skin: open lesion on right foot, mild erythema and tenderness, no drainage  Psych: Good eye contact, normal affect. Memory intact not anxious or depressed appearing.  CNS: CN 2-12 intact, power,  normal throughout.no focal deficits noted.        Assessment & Plan:  Open wound of foot excluding toes without complication Cleocin prescribed, wound is still open and she has PVD  Need for prophylactic vaccination and inoculation against influenza After obtaining informed consent, the vaccine is  administered by LPN.   Prediabetes Patient educated about the importance of limiting  Carbohydrate intake , the need to commit to daily physical activity for a minimum of 30 minutes , and to commit weight loss. The fact that changes in all these areas will reduce or eliminate all together the development of diabetes is stressed.     Diabetic Labs Latest Ref Rng 07/12/2014 07/15/2013 02/24/2013 02/12/2012 04/02/2011  HbA1c <5.7 % - 5.7(H) 6.0(H) - 6.0(H)  Chol 0 - 200 mg/dL - - 171 - -  HDL >39 mg/dL - - 60 - -  Calc LDL 0 - 99 mg/dL - - 102(H) - -  Triglycerides <150 mg/dL - - 46 - -  Creatinine 0.50 - 1.10 mg/dL 0.69 0.70 0.76 0.82 -   BP/Weight 02/28/2015 08/22/2014 07/18/2014 07/12/2014 04/25/2014 07/15/2013 3/32/9518  Systolic BP 841 660 630 160 - 109 323  Diastolic BP 84 82 90 67 - 80 72  Wt. (Lbs) 272 280 - 276 253 252 270.04  BMI 42.59 43.84 - 43.22 39.62 40.07 44.94   No flowsheet data found. Updated lab needed at/ before next visit.     Essential hypertension Controlled, no change in medication DASH diet and commitment to daily physical activity for a minimum of 30 minutes discussed and encouraged, as a part of hypertension management. The importance of attaining a healthy weight is also discussed.  BP/Weight 02/28/2015 08/22/2014 07/18/2014 07/12/2014 04/25/2014 07/15/2013 5/57/3220  Systolic BP 254 270 623 762 - 831 517  Diastolic BP 84 82 90 67 - 80 72  Wt. (Lbs) 272 280 - 276 253 252 270.04  BMI 42.59 43.84 - 43.22 39.62 40.07 44.94        Morbid obesity Improved Patient re-educated about  the importance of commitment to a  minimum  of 150 minutes of exercise per week.  The importance of healthy food choices with portion control discussed. Encouraged to start a food diary, count calories and to consider  joining a support group. Sample diet sheets offered. Goals set by the patient for the next several months.   Weight /BMI 02/28/2015 08/22/2014 07/12/2014  WEIGHT 272 lb 280 lb 276 lb  HEIGHT 5\' 7"  5\' 7"  5\' 7"   BMI 42.59 kg/m2 43.84 kg/m2 43.22 kg/m2    Current exercise per week 90 minutes.

## 2015-02-28 NOTE — Patient Instructions (Addendum)
CPE and pap in 4 months, call if you need me sooner  Flu vaccine today  Additional 5 days of antibiotic are prescribed  Keep wound claen and  dry,applyb4 x4 inch gauze protection twice daily for an additional week  Fasting lipid, chem 7 and TSH in early Novemebr  Thanks for choosing [facility], we consider it a privelige to serve you.

## 2015-03-07 NOTE — Assessment & Plan Note (Signed)
After obtaining informed consent, the vaccine is  administered by LPN.  

## 2015-03-07 NOTE — Assessment & Plan Note (Signed)
Improved Patient re-educated about  the importance of commitment to a  minimum of 150 minutes of exercise per week.  The importance of healthy food choices with portion control discussed. Encouraged to start a food diary, count calories and to consider  joining a support group. Sample diet sheets offered. Goals set by the patient for the next several months.   Weight /BMI 02/28/2015 08/22/2014 07/12/2014  WEIGHT 272 lb 280 lb 276 lb  HEIGHT 5\' 7"  5\' 7"  5\' 7"   BMI 42.59 kg/m2 43.84 kg/m2 43.22 kg/m2    Current exercise per week 90 minutes.

## 2015-03-07 NOTE — Assessment & Plan Note (Signed)
Controlled, no change in medication DASH diet and commitment to daily physical activity for a minimum of 30 minutes discussed and encouraged, as a part of hypertension management. The importance of attaining a healthy weight is also discussed.  BP/Weight 02/28/2015 08/22/2014 07/18/2014 07/12/2014 04/25/2014 07/15/2013 7/97/2820  Systolic BP 601 561 537 943 - 276 147  Diastolic BP 84 82 90 67 - 80 72  Wt. (Lbs) 272 280 - 276 253 252 270.04  BMI 42.59 43.84 - 43.22 39.62 40.07 44.94

## 2015-03-07 NOTE — Assessment & Plan Note (Signed)
Patient educated about the importance of limiting  Carbohydrate intake , the need to commit to daily physical activity for a minimum of 30 minutes , and to commit weight loss. The fact that changes in all these areas will reduce or eliminate all together the development of diabetes is stressed.   Diabetic Labs Latest Ref Rng 07/12/2014 07/15/2013 02/24/2013 02/12/2012 04/02/2011  HbA1c <5.7 % - 5.7(H) 6.0(H) - 6.0(H)  Chol 0 - 200 mg/dL - - 171 - -  HDL >39 mg/dL - - 60 - -  Calc LDL 0 - 99 mg/dL - - 102(H) - -  Triglycerides <150 mg/dL - - 46 - -  Creatinine 0.50 - 1.10 mg/dL 0.69 0.70 0.76 0.82 -   BP/Weight 02/28/2015 08/22/2014 07/18/2014 07/12/2014 04/25/2014 07/15/2013 7/36/6815  Systolic BP 947 076 151 834 - 373 578  Diastolic BP 84 82 90 67 - 80 72  Wt. (Lbs) 272 280 - 276 253 252 270.04  BMI 42.59 43.84 - 43.22 39.62 40.07 44.94   No flowsheet data found. Updated lab needed at/ before next visit.

## 2015-03-07 NOTE — Assessment & Plan Note (Signed)
Cleocin prescribed, wound is still open and she has PVD

## 2015-05-04 ENCOUNTER — Other Ambulatory Visit: Payer: Self-pay | Admitting: Family Medicine

## 2015-05-07 ENCOUNTER — Telehealth: Payer: Self-pay | Admitting: Family Medicine

## 2015-05-07 NOTE — Telephone Encounter (Signed)
Patient has questions regarding her right foot wound, please advise?

## 2015-05-08 NOTE — Telephone Encounter (Signed)
Called and left message for patient to return call.  

## 2015-05-11 NOTE — Telephone Encounter (Signed)
Attempted to reach patient.  Message left on cell #.  Will await return call.

## 2015-05-15 ENCOUNTER — Encounter: Payer: Self-pay | Admitting: Family Medicine

## 2015-05-15 ENCOUNTER — Ambulatory Visit (INDEPENDENT_AMBULATORY_CARE_PROVIDER_SITE_OTHER): Payer: Medicaid Other | Admitting: Family Medicine

## 2015-05-15 VITALS — BP 144/76 | HR 76 | Resp 18 | Ht 67.0 in | Wt 278.0 lb

## 2015-05-15 DIAGNOSIS — I10 Essential (primary) hypertension: Secondary | ICD-10-CM | POA: Diagnosis not present

## 2015-05-15 DIAGNOSIS — S91301S Unspecified open wound, right foot, sequela: Secondary | ICD-10-CM | POA: Diagnosis not present

## 2015-05-15 DIAGNOSIS — M25579 Pain in unspecified ankle and joints of unspecified foot: Secondary | ICD-10-CM | POA: Insufficient documentation

## 2015-05-15 DIAGNOSIS — M1712 Unilateral primary osteoarthritis, left knee: Secondary | ICD-10-CM | POA: Diagnosis not present

## 2015-05-15 DIAGNOSIS — R7303 Prediabetes: Secondary | ICD-10-CM

## 2015-05-15 NOTE — Assessment & Plan Note (Signed)
3 month h/o right foot pain, uulcer still has a scab, 1 month h/o increased pain, no drainage, will refer to ortho

## 2015-05-15 NOTE — Patient Instructions (Addendum)
Annual physical exam in Feb as before   Call if you need me before  You are referred to orthopedics re chronic foot pain   Please work on good  health habits so that your health will improve. 1. Commitment to daily physical activity for 30 to 60  minutes, if you are able to do this.  2. Commitment to wise food choices. Aim for half of your  food intake to be vegetable and fruit, one quarter starchy foods, and one quarter protein. Try to eat on a regular schedule  3 meals per day, snacking between meals should be limited to vegetables or fruits or small portions of nuts. 64 ounces of water per day is generally recommended, unless you have specific health conditions, like heart failure or kidney failure where you will need to limit fluid intake.  3. Commitment to sufficient and a  good quality of physical and mental rest daily, generally between 6 to 8 hours per day.  WITH PERSISTANCE AND PERSEVERANCE, THE IMPOSSIBLE , BECOMES THE NORM!   Thanks for choosing [facility], we consider it a privelige to serve you. All the best for 2017!

## 2015-05-15 NOTE — Assessment & Plan Note (Signed)
Pt had arthroscopy of left knee in Feb , 2016, has been getting intra articular injections fron Dr Luna Glasgow since then for ongoing pain, needs referral for contiin uity of care

## 2015-05-16 LAB — BASIC METABOLIC PANEL
BUN: 9 mg/dL (ref 7–25)
CO2: 29 mmol/L (ref 20–31)
Calcium: 9.8 mg/dL (ref 8.6–10.4)
Chloride: 99 mmol/L (ref 98–110)
Creat: 0.72 mg/dL (ref 0.50–1.05)
Glucose, Bld: 83 mg/dL (ref 65–99)
Potassium: 3.9 mmol/L (ref 3.5–5.3)
Sodium: 135 mmol/L (ref 135–146)

## 2015-05-16 LAB — LIPID PANEL
Cholesterol: 171 mg/dL (ref 125–200)
HDL: 63 mg/dL (ref >=46)
LDL Cholesterol: 96 mg/dL (ref <130)
Total CHOL/HDL Ratio: 2.7 Ratio (ref <=5.0)
Triglycerides: 62 mg/dL (ref <150)
VLDL: 12 mg/dL (ref <30)

## 2015-05-16 LAB — TSH: TSH: 1.287 u[IU]/mL (ref 0.350–4.500)

## 2015-05-17 ENCOUNTER — Telehealth: Payer: Self-pay

## 2015-05-17 DIAGNOSIS — S91301S Unspecified open wound, right foot, sequela: Secondary | ICD-10-CM

## 2015-05-17 NOTE — Telephone Encounter (Signed)
Referral entered for [facility].

## 2015-05-20 NOTE — Assessment & Plan Note (Signed)
Deteriorated. Patient re-educated about  the importance of commitment to a  minimum of 150 minutes of exercise per week.  The importance of healthy food choices with portion control discussed. Encouraged to start a food diary, count calories and to consider  joining a support group. Sample diet sheets offered. Goals set by the patient for the next several months.   Weight /BMI 05/15/2015 02/28/2015 08/22/2014  WEIGHT 278 lb 272 lb 280 lb  HEIGHT 5\' 7"  5\' 7"  5\' 7"   BMI 43.53 kg/m2 42.59 kg/m2 43.84 kg/m2    Current exercise per week 30 mins, limited by orthopedic issues

## 2015-05-20 NOTE — Progress Notes (Signed)
Subjective:    Patient ID: Suzanne Andrews, female    DOB: August 10, 1965, 49 y.o.   MRN: [id number]  HPI Pt in with 1 week h/o increased burning pain in  Right foot in area where she had trauma over 2 months ago, was treated successfully with antibiotics, as she had drainage and an open wound, however , npow presents with recurring symptoms , no drainage in area however. Initial treatment was at urgent care and then she followed up in the office  With me several weeks ago. Does have some vascular compromise in lowe extremity circulation Though she has been orthopedics for other joint problems has not had evaluation of this by ortho and will benefit from same, will leave any imaging study deemed necessary to ortho   Review of Systems    See HPI Denies recent fever or chills. Denies sinus pressure, nasal congestion, ear pain or sore throat. Denies chest congestion, productive cough or wheezing. Denies chest pains, palpitations and leg swelling Denies abdominal pain, nausea, vomiting,diarrhea or constipation.   Denies dysuria, frequency, hesitancy or incontinence.  Denies headaches, seizures, numbness, or tingling. Denies depression, anxiety or insomnia.    Objective:   Physical Exam  BP 144/76 mmHg  Pulse 76  Resp 18  Ht 5\' 7"  (1.702 m)  Wt 278 lb (126.1 kg)  BMI 43.53 kg/m2  SpO2 99% Patient alert and oriented and in no cardiopulmonary distress.  HEENT: No facial asymmetry, EOMI,   oropharynx pink and moist.  Neck supple no JVD, no mass.  Chest: Clear to auscultation bilaterally.  CVS: S1, S2 no murmurs, no S3.Regular rate.  ABD: Soft non tender.   Ext: No edema  MS: Adequate ROM spine, shoulders, hips and reduced in left   Knee, which has swelling and crepitus.  Skin: Intact, scab on dorsum of left foot in area of concern, skin fully healed with no open skin , no drainage, warmth or erythema noted  Psych: Good eye contact, normal affect. Memory intact not anxious  or depressed appearing.  CNS: CN 2-12 intact, power,  normal throughout.no focal deficits noted.       Assessment & Plan:  Open wound of foot excluding toes without complication 3 month h/o right foot pain, uulcer still has a scab, 1 month h/o increased pain, no drainage, will refer to ortho  Osteoarthritis, knee Pt had arthroscopy of left knee in Feb , 2016, has been getting intra articular injections fron Dr Luna Glasgow since then for ongoing pain, needs referral for contiin uity of care  Essential hypertension Uncontrolled, importance of med compliance, and lifestyle change is stressed DASH diet and commitment to daily physical activity for a minimum of 30 minutes discussed and encouraged, as a part of hypertension management. The importance of attaining a healthy weight is also discussed.  BP/Weight 05/15/2015 02/28/2015 08/22/2014 07/18/2014 07/12/2014 04/25/2014 [id number]  Systolic BP 123456 A999333 123456 [id number] - [id number]  Diastolic BP 76 84 82 90 67 - 80  Wt. (Lbs) 278 272 280 - 276 253 252  BMI 43.53 42.59 43.84 - 43.22 39.62 40.07   No med  Change made at this visit     Morbid obesity Deteriorated. Patient re-educated about  the importance of commitment to a  minimum of 150 minutes of exercise per week.  The importance of healthy food choices with portion control discussed. Encouraged to start a food diary, count calories and to consider  joining a support group. Sample diet sheets offered. Goals set by  the patient for the next several months.   Weight /BMI 05/15/2015 02/28/2015 08/22/2014  WEIGHT 278 lb 272 lb 280 lb  HEIGHT 5\' 7"  5\' 7"  5\' 7"   BMI 43.53 kg/m2 42.59 kg/m2 43.84 kg/m2    Current exercise per week 30 mins, limited by orthopedic issues   Prediabetes Patient educated about the importance of limiting  Carbohydrate intake , the need to commit to daily physical activity for a minimum of 30 minutes , and to commit weight loss. The fact that changes in all these areas will  reduce or eliminate all together the development of diabetes is stressed.  Updated lab needed at/ before next visit.    Diabetic Labs Latest Ref Rng 05/15/2015 07/12/2014 07/15/2013 02/24/2013 02/12/2012  HbA1c <5.7 % - - 5.7(H) 6.0(H) -  Chol 125 - 200 mg/dL 171 - - 171 -  HDL >=46 mg/dL 63 - - 60 -  Calc LDL <130 mg/dL 96 - - 102(H) -  Triglycerides <150 mg/dL 62 - - 46 -  Creatinine 0.50 - 1.05 mg/dL 0.72 0.69 0.70 0.76 0.82   BP/Weight 05/15/2015 02/28/2015 08/22/2014 07/18/2014 07/12/2014 04/25/2014 [id number]  Systolic BP 123456 A999333 123456 [id number] - [id number]  Diastolic BP 76 84 82 90 67 - 80  Wt. (Lbs) 278 272 280 - 276 253 252  BMI 43.53 42.59 43.84 - 43.22 39.62 40.07   No flowsheet data found.

## 2015-05-20 NOTE — Assessment & Plan Note (Signed)
Patient educated about the importance of limiting  Carbohydrate intake , the need to commit to daily physical activity for a minimum of 30 minutes , and to commit weight loss. The fact that changes in all these areas will reduce or eliminate all together the development of diabetes is stressed.  Updated lab needed at/ before next visit.    Diabetic Labs Latest Ref Rng 05/15/2015 07/12/2014 07/15/2013 02/24/2013 02/12/2012  HbA1c <5.7 % - - 5.7(H) 6.0(H) -  Chol 125 - 200 mg/dL 171 - - 171 -  HDL >=46 mg/dL 63 - - 60 -  Calc LDL <130 mg/dL 96 - - 102(H) -  Triglycerides <150 mg/dL 62 - - 46 -  Creatinine 0.50 - 1.05 mg/dL 0.72 0.69 0.70 0.76 0.82   BP/Weight 05/15/2015 02/28/2015 08/22/2014 07/18/2014 07/12/2014 04/25/2014 Q000111Q  Systolic BP 123456 A999333 123456 [id number] - [id number]  Diastolic BP 76 84 82 90 67 - 80  Wt. (Lbs) 278 272 280 - 276 253 252  BMI 43.53 42.59 43.84 - 43.22 39.62 40.07   No flowsheet data found.

## 2015-05-20 NOTE — Assessment & Plan Note (Signed)
Uncontrolled, importance of med compliance, and lifestyle change is stressed DASH diet and commitment to daily physical activity for a minimum of 30 minutes discussed and encouraged, as a part of hypertension management. The importance of attaining a healthy weight is also discussed.  BP/Weight 05/15/2015 02/28/2015 08/22/2014 07/18/2014 07/12/2014 04/25/2014 [id number]  Systolic BP 123456 A999333 123456 [id number] - [id number]  Diastolic BP 76 84 82 90 67 - 80  Wt. (Lbs) 278 272 280 - 276 253 252  BMI 43.53 42.59 43.84 - 43.22 39.62 40.07   No med  Change made at this visit

## 2015-05-23 ENCOUNTER — Telehealth: Payer: Self-pay | Admitting: Family Medicine

## 2015-05-23 DIAGNOSIS — L989 Disorder of the skin and subcutaneous tissue, unspecified: Secondary | ICD-10-CM

## 2015-05-23 NOTE — Telephone Encounter (Addendum)
April from the [facility] in [place] is calling stating that she called [facility] to get her scheduled with Dr. Nils Pyle and the Patient denied the referral and stated that her Primary Care Dr was sending her somewhere else, are we aware of this? Please Advise?

## 2015-05-24 NOTE — Telephone Encounter (Signed)
Patient referred.

## 2015-05-24 NOTE — Telephone Encounter (Signed)
pls efer to derm who sees medicaid  Pts, which is not Dr Nevada Crane, let her know, for skin lesion

## 2015-05-24 NOTE — Addendum Note (Signed)
Addended by: Eual Fines on: 05/24/2015 03:08 PM   Modules accepted: Orders

## 2015-05-24 NOTE — Telephone Encounter (Signed)
States she ended up seeing keeling and he wants her PCP to send her to Dr Nevada Crane for the area on her right foot. Sent for the note from [facility] but haven't received it but I spoke with the nurse at [facility] office that said that he did want her referred to Dr Nevada Crane and patient is aware. Is this ok?

## 2015-06-07 ENCOUNTER — Other Ambulatory Visit: Payer: Self-pay | Admitting: Family Medicine

## 2015-07-31 ENCOUNTER — Ambulatory Visit (INDEPENDENT_AMBULATORY_CARE_PROVIDER_SITE_OTHER): Payer: Medicaid Other | Admitting: Family Medicine

## 2015-07-31 ENCOUNTER — Other Ambulatory Visit (HOSPITAL_COMMUNITY)
Admission: RE | Admit: 2015-07-31 | Discharge: 2015-07-31 | Disposition: A | Discharge status: Home / Self Care | Payer: Medicaid Other | Source: Ambulatory Visit | Attending: Family Medicine | Admitting: Family Medicine

## 2015-07-31 ENCOUNTER — Encounter: Payer: Self-pay | Admitting: Family Medicine

## 2015-07-31 VITALS — BP 124/76 | HR 76 | Resp 18 | Ht 67.0 in | Wt 277.0 lb

## 2015-07-31 DIAGNOSIS — Z01419 Encounter for gynecological examination (general) (routine) without abnormal findings: Secondary | ICD-10-CM | POA: Diagnosis present

## 2015-07-31 DIAGNOSIS — Z1211 Encounter for screening for malignant neoplasm of colon: Secondary | ICD-10-CM | POA: Diagnosis not present

## 2015-07-31 DIAGNOSIS — Z1151 Encounter for screening for human papillomavirus (HPV): Secondary | ICD-10-CM | POA: Insufficient documentation

## 2015-07-31 DIAGNOSIS — I10 Essential (primary) hypertension: Secondary | ICD-10-CM

## 2015-07-31 DIAGNOSIS — Z124 Encounter for screening for malignant neoplasm of cervix: Secondary | ICD-10-CM

## 2015-07-31 DIAGNOSIS — R7303 Prediabetes: Secondary | ICD-10-CM | POA: Diagnosis not present

## 2015-07-31 DIAGNOSIS — Z0000 Encounter for general adult medical examination without abnormal findings: Secondary | ICD-10-CM | POA: Diagnosis not present

## 2015-07-31 DIAGNOSIS — E559 Vitamin D deficiency, unspecified: Secondary | ICD-10-CM

## 2015-07-31 LAB — POC HEMOCCULT BLD/STL (OFFICE/1-CARD/DIAGNOSTIC): Fecal Occult Blood, POC: NEGATIVE

## 2015-07-31 NOTE — Patient Instructions (Signed)
F/u in 4.5 month, call if you need me sooner  Non fasting labs asap  Work on lifestyle change, this will make you healthier  You are referred for colonoscopy to Dr Oneida Alar    Colonoscopy A colonoscopy is an exam to look at your colon. This exam can help find lumps (tumors), growths (polyps), bleeding, and redness and puffiness (inflammation) in your colon.  BEFORE THE PROCEDURE  Ask your doctor about changing or stopping your regular medicines.  You may need to drink a large amount of a special liquid (oral bowel prep). You start drinking this the day before your procedure. It will cause you to have watery poop (stool). This cleans out your colon.  Do not eat or drink anything else once you have started the bowel prep, unless your doctor tells you it is safe to do so.  Make plans for someone to drive you home after the procedure. PROCEDURE  You will be given medicine to help you relax (sedative).  You will lie on your side with your knees bent.  A tube with a camera on the end is put in the opening of your butt (anus) and into your colon. Pictures are sent to a computer screen. Your doctor will look for anything that is not normal.  Your doctor may take a tissue sample (biopsy) from your colon to be looked at more closely.  The exam is finished when your doctor has viewed all of the colon. AFTER THE PROCEDURE  Do not drive for 24 hours after the exam.  You may have a small amount of blood in your poop. This is normal.  You may pass gas and have belly (abdominal) cramps. This is normal.  Ask when your test results will be ready. Make sure you get your test results.   This information is not intended to replace advice given to you by your health care provider. Make sure you discuss any questions you have with your health care provider.   Document Released: 06/21/2010 Document Revised: 05/24/2013 Document Reviewed: 01/24/2013 Elsevier Interactive Patient Education [employer]

## 2015-07-31 NOTE — Progress Notes (Signed)
   Subjective:    Patient ID: Suzanne Andrews, female    DOB: [birth year], 50 y.o.   MRN: [id number]  HPI  Patient is in for annual physical exam. No other health concerns are expressed or addressed at the visit. Recent labs, if available are reviewed. Immunization is reviewed , and  updated if needed.   Review of Systems  See HPI   Objective:   Physical Exam BP 124/76 mmHg  Pulse 76  Resp 18  Ht 5\' 7"  (1.702 m)  Wt 277 lb (125.646 kg)  BMI 43.37 kg/m2  SpO2 98% Pleasant well nourished female, alert and oriented x 3, in no cardio-pulmonary distress. Afebrile. HEENT No facial trauma or asymetry. Sinuses non tender.  Extra occullar muscles intact, pupils equally reactive to light. External ears normal, tympanic membranes clear. Oropharynx moist, no exudate, good dentition. Neck: supple, no adenopathy,JVD or thyromegaly.No bruits.  Chest: Clear to ascultation bilaterally.No crackles or wheezes. Non tender to palpation  Breast: No asymetry,no masses or lumps. No tenderness. No nipple discharge or inversion. No axillary or supraclavicular adenopathy  Cardiovascular system; Heart sounds normal,  S1 and  S2 ,no S3.  No murmur, or thrill. Apical beat not displaced Peripheral pulses normal.  Abdomen: Soft, non tender, no organomegaly or masses. No bruits. Bowel sounds normal. No guarding, tenderness or rebound.  Rectal:  Normal sphincter tone. No mass.No rectal masses.  Guaiac negative stool.  GU: External genitalia normal female genitalia , female distribution of hair. No lesions. Urethral meatus normal in size, no  Prolapse, no lesions visibly  Present. Bladder non tender. Vagina pink and moist , with no visible lesions , discharge present . Adequate pelvic support no  cystocele or rectocele noted Cervix pink and appears healthy, no lesions or ulcerations noted, no discharge noted from os Uterus normal size, no adnexal masses, no cervical motion or adnexal  tenderness.   Musculoskeletal exam: Full ROM of spine, hips , shoulders and knees. No deformity ,swelling or crepitus noted. No muscle wasting or atrophy.   Neurologic: Cranial nerves 2 to 12 intact. Power, tone ,sensation and reflexes normal throughout. No disturbance in gait. No tremor.  Skin: Intact, no ulceration, erythema , scaling or rash noted. Pigmentation normal throughout  Psych; Normal mood and affect. Judgement and concentration normal        Assessment & Plan:  Annual physical exam Annual exam as documented. Counseling done  re healthy lifestyle involving commitment to 150 minutes exercise per week, heart healthy diet, and attaining healthy weight.The importance of adequate sleep also discussed. Regular seat belt use and home safety, is also discussed. Changes in health habits are decided on by the patient with goals and time frames  set for achieving them. Immunization and cancer screening needs are specifically addressed at this visit.

## 2015-08-02 ENCOUNTER — Encounter: Payer: Self-pay | Admitting: Orthopaedic Surgery

## 2015-08-02 ENCOUNTER — Ambulatory Visit (INDEPENDENT_AMBULATORY_CARE_PROVIDER_SITE_OTHER): Payer: Medicaid Other | Admitting: Orthopaedic Surgery

## 2015-08-02 VITALS — BP 126/72 | HR 89 | Resp 16 | Ht 66.0 in | Wt 277.0 lb

## 2015-08-02 DIAGNOSIS — M25561 Pain in right knee: Secondary | ICD-10-CM | POA: Insufficient documentation

## 2015-08-02 LAB — CYTOLOGY - PAP

## 2015-08-02 MED ORDER — SODIUM HYALURONATE (VISCOSUP) 25 MG/2.5ML IX SOSY: PREFILLED_SYRINGE | INTRA_ARTICULAR | Status: DC

## 2015-08-02 NOTE — Patient Instructions (Signed)
Supartz injections will be ordered

## 2015-08-02 NOTE — Addendum Note (Signed)
Addended by: Baldomero Lamy B on: 08/02/2015 05:31 PM   Modules accepted: Orders

## 2015-08-02 NOTE — Progress Notes (Signed)
Patient [name], female DOB:02-03-1966, 50 y.o. [id number]  Chief Complaint  Patient presents with  . Follow-up    follow up left knee pain s/p supartz injections    HPI  Suzanne Andrews is a 50 y.o. female who had had pain in both knees.  I injected the left knee with a series of 3 injections of Supartz in November. She has done very well from it with no pain.  She has very good motion. Her right knee is hurting.  She has popping and swelling.  She has pain.  She would like to have Supartz for the right knee.  HPI  Body mass index is 44.73 kg/(m^2).  Review of Systems  Patient does not have Diabetes Mellitus. Patient has hypertension. Patient does not have COPD or shortness of breath. Patient does not have BMI > 35. Patient does not have current smoking history.  Review of Systems  Past Medical History  Diagnosis Date  . Hypertension     Past Surgical History  Procedure Laterality Date  . Cesarean section      x 2  . Vascular surgery bilateral leg    . Tubal ligation    . Cholecystectomy    . Knee arthroscopy with medial menisectomy Left 07/18/2014    Procedure: LEFT KNEE ARTHROSCOPY WITH MEDIAL AND LATERAL MENISECTOMY;  Surgeon: Sanjuana Kava, MD;  Location: [facility];  Service: Orthopedics;  Laterality: Left;    Family History  Problem Relation Age of Onset  . Hypertension Mother   . Cancer Father   . Hypertension Sister   . Cancer Sister     breast  . Cancer Brother   . Hypertension Brother   . Cancer Brother     Social History Social History  Substance Use Topics  . Smoking status: Never Smoker   . Smokeless tobacco: None  . Alcohol Use: No    No Known Allergies  Current Outpatient Prescriptions  Medication Sig Dispense Refill  . lisinopril-hydrochlorothiazide (PRINZIDE,ZESTORETIC) 20-25 MG tablet Take 1 tablet by mouth daily. 30 tablet 5  . loratadine (CLARITIN) 10 MG tablet Take 10 mg by mouth daily as needed for allergies.     .  naproxen (NAPROSYN) 500 MG tablet Take 500 mg by mouth 2 (two) times daily with a meal.    . Vitamin D, Ergocalciferol, (DRISDOL) 50000 UNITS CAPS capsule TAKE ONE CAPSULE BY MOUTH ONCE A WEEK 12 capsule 0   No current facility-administered medications for this visit.     Physical Exam  Blood pressure 126/72, pulse 89, resp. rate 16, height 5\' 6"  (1.676 m), weight 277 lb (125.646 kg).  Constitutional: overall normal hygiene, normal nutrition, well developed, normal grooming, normal body habitus. Assistive device:none  Musculoskeletal: gait and station Limp none, muscle tone and strength are normal, no tremors or atrophy is present.  .  Neurological: coordination overall normal.  Deep tendon reflex/nerve stretch intact.  Sensation normal.  Cranial nerves II-XII intact.   Skin:   normal overall no scars, lesions, ulcers or rashes. No psoriasis.  Psychiatric: Alert and oriented x 3.  Recent memory intact, remote memory unclear.  Normal mood and affect. Well groomed.  Good eye contact.  Cardiovascular: overall no swelling, no varicosities, no edema bilaterally, normal temperatures of the legs and arms, no clubbing, cyanosis and good capillary refill.  Lymphatic: palpation is normal. The right lower extremity is examined:  Inspection:  Thigh:  Non-tender and no defects  Knee has swelling 1+ effusion.  Joint tenderness is present                        Patient is tender over the medial joint line  Lower Leg:  Has normal appearance and no tenderness or defects  Ankle:  Non-tender and no defects  Foot:  Non-tender and no defects Range of Motion:  Knee:  Range of motion is: 0 to 105                        Crepitus is  present  Ankle:  Range of motion is normal. Strength and Tone:  The right lower extremity has normal strength and tone. Stability:  Knee:  The knee is stable.  Ankle:  The ankle is stable.  Her left knee is doing well.  She had the [place] in  November.  She has little pain and nearly full motion.  Additional services performed: will order Supartz for the right knee  The patient has been educated about the nature of the problem(s) and counseled on treatment options.  The patient appeared to understand what I have discussed and is in agreement with it.  PLAN Call if any problems.  Precautions discussed.  Continue current medications.   Return to clinic after we get the [employer] for weekly injections times three

## 2015-08-05 DIAGNOSIS — Z0000 Encounter for general adult medical examination without abnormal findings: Secondary | ICD-10-CM | POA: Insufficient documentation

## 2015-08-05 NOTE — Assessment & Plan Note (Signed)

## 2015-08-13 ENCOUNTER — Telehealth: Payer: Self-pay | Admitting: Orthopaedic Surgery

## 2015-08-21 NOTE — Telephone Encounter (Signed)
done

## 2015-08-30 ENCOUNTER — Ambulatory Visit (INDEPENDENT_AMBULATORY_CARE_PROVIDER_SITE_OTHER): Payer: Medicaid Other | Admitting: Orthopaedic Surgery

## 2015-08-30 VITALS — BP 141/88 | HR 76 | Temp 97.2°F | Ht 66.0 in | Wt 277.0 lb

## 2015-08-30 DIAGNOSIS — M25561 Pain in right knee: Secondary | ICD-10-CM

## 2015-08-30 NOTE — Progress Notes (Signed)
CC:  I am ready for my shot  She is to have Synvisc, three injections for the right knee.  Today will be the first.  She has no new trauma.  Her right knee has no redness.  ROM is 0 to 105 with crepitus.  IMPRESSION: Encounter Diagnosis  Name Primary?  . Right knee pain Yes    PROCEDURE NOTE:  Synvisc injection #  1 of 3   Injection 1 vial of synvisc into right  knee  BP 141/88 mmHg  Pulse 76  Temp(Src) 97.2 F (36.2 C)  Ht 5\' 6"  (1.676 m)  Wt 277 lb (125.646 kg)  BMI 44.73 kg/m2  The right knee exam: there was no synovitis or infection   The knee was prepped sterilely  Ethyl chloride was used to anesthetize the skin A 20 g needle was used to inject the knee with 1 vial of synvisc A sterile dressing was placed  There were no complications  Follow up one week

## 2015-09-06 ENCOUNTER — Ambulatory Visit (INDEPENDENT_AMBULATORY_CARE_PROVIDER_SITE_OTHER): Payer: Medicaid Other | Admitting: Orthopaedic Surgery

## 2015-09-06 ENCOUNTER — Encounter: Payer: Medicaid Other | Admitting: Orthopaedic Surgery

## 2015-09-06 ENCOUNTER — Encounter: Payer: Self-pay | Admitting: Orthopaedic Surgery

## 2015-09-06 VITALS — BP 136/82 | HR 76 | Temp 98.6°F | Ht 66.0 in | Wt 282.8 lb

## 2015-09-06 DIAGNOSIS — M25561 Pain in right knee: Secondary | ICD-10-CM

## 2015-09-06 MED ORDER — HYLAN G-F 20 16 MG/2ML IX SOSY: PREFILLED_SYRINGE | INTRA_ARTICULAR | Status: DC

## 2015-09-06 NOTE — Addendum Note (Signed)
Addended by: Baldomero Lamy B on: 09/06/2015 10:16 AM   Modules accepted: Orders

## 2015-09-06 NOTE — Progress Notes (Signed)
CC:  I am here for my Synvisc injection  PROCEDURE NOTE:  Synvisc injection #  2 of 3   Injection 1 vial of synvisc into right  knee  BP 136/82 mmHg  Pulse 76  Temp(Src) 98.6 F (37 C) (Tympanic)  Ht 5\' 6"  (1.676 m)  Wt 282 lb 12.8 oz (128.277 kg)  BMI 45.67 kg/m2  The right knee exam: there was no synovitis or infection   The knee was prepped sterilely  Ethyl chloride was used to anesthetize the skin A 20 g needle was used to inject the knee with 1 vial of synvisc A sterile dressing was placed  There were no complications  Follow up one week.

## 2015-09-06 NOTE — Progress Notes (Signed)
This encounter was created in error - please disregard.

## 2015-09-10 ENCOUNTER — Other Ambulatory Visit: Payer: Self-pay | Admitting: Family Medicine

## 2015-09-13 ENCOUNTER — Encounter: Payer: Self-pay | Admitting: Orthopaedic Surgery

## 2015-09-13 ENCOUNTER — Ambulatory Visit (INDEPENDENT_AMBULATORY_CARE_PROVIDER_SITE_OTHER): Payer: Medicaid Other | Admitting: Orthopaedic Surgery

## 2015-09-13 VITALS — BP 124/81 | HR 78 | Temp 97.3°F | Ht 66.0 in | Wt 282.0 lb

## 2015-09-13 DIAGNOSIS — M25561 Pain in right knee: Secondary | ICD-10-CM

## 2015-09-13 DIAGNOSIS — M25562 Pain in left knee: Secondary | ICD-10-CM

## 2015-09-13 NOTE — Progress Notes (Signed)
PROCEDURE NOTE:  Synvisc injection #  3 of 3   Injection 1 vial of Synvisc into right  knee  BP 124/81 mmHg  Pulse 78  Temp(Src) 97.3 F (36.3 C)  Ht 5\' 6"  (1.676 m)  Wt 282 lb (127.914 kg)  BMI 45.54 kg/m2  The right knee exam: there was no synovitis or infection   The knee was prepped sterilely  Ethyl chloride was used to anesthetize the skin A 20 g needle was used to inject the knee with 1 vial of Synvisc A sterile dressing was placed  There were no complications  Follow up one week  PROCEDURE NOTE:  Synvisc injection #  1 of 3   Injection 1 vial of Synvisc into left  knee  BP 124/81 mmHg  Pulse 78  Temp(Src) 97.3 F (36.3 C)  Ht 5\' 6"  (1.676 m)  Wt 282 lb (127.914 kg)  BMI 45.54 kg/m2  The left knee exam: there was no synovitis or infection   The knee was prepped sterilely  Ethyl chloride was used to anesthetize the skin A 20 g needle was used to inject the knee with 1 vial of Synvisc A sterile dressing was placed  There were no complications  Follow up one week

## 2015-09-20 ENCOUNTER — Ambulatory Visit (INDEPENDENT_AMBULATORY_CARE_PROVIDER_SITE_OTHER): Payer: Medicaid Other | Admitting: Orthopaedic Surgery

## 2015-09-20 ENCOUNTER — Encounter: Payer: Self-pay | Admitting: Orthopaedic Surgery

## 2015-09-20 VITALS — BP 129/74 | HR 74 | Temp 97.5°F | Ht 66.0 in | Wt 282.0 lb

## 2015-09-20 DIAGNOSIS — M25562 Pain in left knee: Secondary | ICD-10-CM | POA: Diagnosis not present

## 2015-09-20 NOTE — Progress Notes (Signed)
Patient ID: Suzanne Andrews, female   DOB: Jul 13, 1965, 50 y.o.   MRN: [id number]  PROCEDURE NOTE:  Synvisc injection #  2 of 3   Injection 1 vial of Synvisc into left  knee  BP 129/74 mmHg  Pulse 74  Temp(Src) 97.5 F (36.4 C)  Ht 5\' 6"  (1.676 m)  Wt 282 lb (127.914 kg)  BMI 45.54 kg/m2  The left knee exam: there was no synovitis or infection   The knee was prepped sterilely  Ethyl chloride was used to anesthetize the skin A 20 g needle was used to inject the knee with 1 vial of Synvisc A sterile dressing was placed  There were no complications  Encounter Diagnosis  Name Primary?  . Left knee pain Yes    Follow up one week

## 2015-09-27 ENCOUNTER — Ambulatory Visit (INDEPENDENT_AMBULATORY_CARE_PROVIDER_SITE_OTHER): Payer: Medicaid Other | Admitting: Orthopaedic Surgery

## 2015-09-27 VITALS — BP 126/83 | HR 71 | Temp 97.5°F | Ht 66.0 in | Wt 282.0 lb

## 2015-09-27 DIAGNOSIS — I10 Essential (primary) hypertension: Secondary | ICD-10-CM

## 2015-09-27 DIAGNOSIS — M25562 Pain in left knee: Secondary | ICD-10-CM

## 2015-09-27 NOTE — Progress Notes (Signed)
Patient ID: Suzanne Andrews, female   DOB: 05/05/1966, 50 y.o.   MRN: [id number]  CC:  I am here for my last injection.  PROCEDURE NOTE:  Synvisc injection #  3 of 3   Injection 1 vial of Synvisc into left  knee  BP 126/83 mmHg  Pulse 71  Temp(Src) 97.5 F (36.4 C)  Ht 5\' 6"  (1.676 m)  Wt 282 lb (127.914 kg)  BMI 45.54 kg/m2  The left knee exam: there was no synovitis or infection   The knee was prepped sterilely  Ethyl chloride was used to anesthetize the skin A 20 g needle was used to inject the knee with 1 vial of Synvisc A sterile dressing was placed  There were no complications  Encounter Diagnoses  Name Primary?  . Left knee pain Yes  . Essential hypertension     Follow up one month

## 2015-10-31 ENCOUNTER — Ambulatory Visit (INDEPENDENT_AMBULATORY_CARE_PROVIDER_SITE_OTHER): Payer: Medicaid Other | Admitting: Orthopaedic Surgery

## 2015-10-31 ENCOUNTER — Encounter: Payer: Self-pay | Admitting: Orthopaedic Surgery

## 2015-10-31 VITALS — BP 122/79 | HR 72 | Temp 97.5°F | Ht 66.0 in | Wt 284.0 lb

## 2015-10-31 DIAGNOSIS — I10 Essential (primary) hypertension: Secondary | ICD-10-CM

## 2015-10-31 DIAGNOSIS — M25562 Pain in left knee: Secondary | ICD-10-CM | POA: Diagnosis not present

## 2015-10-31 DIAGNOSIS — M25561 Pain in right knee: Secondary | ICD-10-CM

## 2015-10-31 MED ORDER — TRAMADOL HCL 50 MG PO TABS: 50.0000 mg | ORAL_TABLET | Freq: Four times a day (QID) | ORAL | Status: DC | PRN

## 2015-10-31 NOTE — Progress Notes (Signed)
Patient [name], female DOB:[birth year], 50 y.o. [id number]  Chief Complaint  Patient presents with  . Follow-up    Left knee    HPI  Suzanne Andrews is a 50 y.o. female who has bilateral knee pain.  She has completed Synvisc injections last visit.  She has been very active going to graduations and family functions.  She has tenderness of both knees, no giving way, no locking, no redness, no trauma.  She is taking her Naprosyn.  HPI  Body mass index is 45.86 kg/(m^2).  ROS  Review of Systems  HENT: Negative for congestion.   Respiratory: Negative for cough and shortness of breath.   Cardiovascular: Negative for chest pain and leg swelling.  Endocrine: Positive for cold intolerance.  Musculoskeletal: Positive for joint swelling, arthralgias and gait problem.  Allergic/Immunologic: Positive for environmental allergies.    Past Medical History  Diagnosis Date  . Hypertension     Past Surgical History  Procedure Laterality Date  . Cesarean section      x 2  . Vascular surgery bilateral leg    . Tubal ligation    . Cholecystectomy    . Knee arthroscopy with medial menisectomy Left 07/18/2014    Procedure: LEFT KNEE ARTHROSCOPY WITH MEDIAL AND LATERAL MENISECTOMY;  Surgeon: Sanjuana Kava, MD;  Location: [facility];  Service: Orthopedics;  Laterality: Left;    Family History  Problem Relation Age of Onset  . Hypertension Mother   . Cancer Father   . Hypertension Sister   . Cancer Sister     breast  . Cancer Brother   . Hypertension Brother   . Cancer Brother     Social History Social History  Substance Use Topics  . Smoking status: Never Smoker   . Smokeless tobacco: None  . Alcohol Use: No    No Known Allergies  Current Outpatient Prescriptions  Medication Sig Dispense Refill  . Hylan (SYNVISC) 16 MG/2ML SOSY Inject into knee once weekly x 3 weeks 3 Syringe 0  . lisinopril-hydrochlorothiazide (PRINZIDE,ZESTORETIC) 20-25 MG tablet Take 1 tablet by  mouth daily. 30 tablet 5  . loratadine (CLARITIN) 10 MG tablet Take 10 mg by mouth daily as needed for allergies.     . naproxen (NAPROSYN) 500 MG tablet Take 500 mg by mouth 2 (two) times daily with a meal.    . Sodium Hyaluronate (SUPARTZ) 25 MG/2.5ML SOSY One injection in right knee weekly for three weeks 3 Syringe 0  . SYNVISC 16 MG/2ML SOSY INJECT AS DIRECTED WEEKLY FOR 3 WEEKS TO THE KNEE 3 Syringe 0  . Vitamin D, Ergocalciferol, (DRISDOL) 50000 units CAPS capsule TAKE ONE CAPSULE BY MOUTH ONCE A WEEK 12 capsule 3  . traMADol (ULTRAM) 50 MG tablet Take 1 tablet (50 mg total) by mouth every 6 (six) hours as needed. 60 tablet 3   No current facility-administered medications for this visit.     Physical Exam  Blood pressure 122/79, pulse 72, temperature 97.5 F (36.4 C), height 5\' 6"  (1.676 m), weight 284 lb (128.822 kg).  Constitutional: overall normal hygiene, normal nutrition, well developed, normal grooming, normal body habitus. Assistive device:none  Musculoskeletal: gait and station Limp left, muscle tone and strength are normal, no tremors or atrophy is present.  .  Neurological: coordination overall normal.  Deep tendon reflex/nerve stretch intact.  Sensation normal.  Cranial nerves II-XII intact.   Skin:   normal overall no scars, lesions, ulcers or rashes. No psoriasis.  Psychiatric: Alert and oriented  x 3.  Recent memory intact, remote memory unclear.  Normal mood and affect. Well groomed.  Good eye contact.  Cardiovascular: overall no swelling, no varicosities, no edema bilaterally, normal temperatures of the legs and arms, no clubbing, cyanosis and good capillary refill.  Lymphatic: palpation is normal.  Right Knee Exam   Tenderness  The patient is experiencing tenderness in the medial joint line.  Range of Motion  Extension: 0  Flexion: 110   Tests  McMurray:  Medial - negative  Lachman:  Anterior - negative      Other  Erythema: absent Scars:  absent Sensation: normal Pulse: present Swelling: none   Left Knee Exam   Tenderness  The patient is experiencing tenderness in the medial joint line.  Range of Motion  Extension: 0  Flexion: 100   Tests  McMurray:  Medial - negative  Lachman:  Anterior - negative      Other  Erythema: absent Scars: absent Sensation: normal Pulse: present Swelling: none  Comments:  Limp to the left.      The patient has been educated about the nature of the problem(s) and counseled on treatment options.  The patient appeared to understand what I have discussed and is in agreement with it.  Encounter Diagnoses  Name Primary?  . Left knee pain Yes  . Right knee pain   . Essential hypertension     PLAN Call if any problems.  Precautions discussed.  Continue current medications.   Return to clinic 1 month   I have given Rx for Ultram.  Precautions discussed.

## 2015-11-14 NOTE — Addendum Note (Signed)
Addended by: Willette Pa on: 11/14/2015 10:02 PM   Modules accepted: Miquel Dunn

## 2015-11-16 ENCOUNTER — Other Ambulatory Visit: Payer: Self-pay | Admitting: Family Medicine

## 2015-11-19 ENCOUNTER — Other Ambulatory Visit: Payer: Self-pay

## 2015-11-19 MED ORDER — LISINOPRIL-HYDROCHLOROTHIAZIDE 20-25 MG PO TABS
1.0000 | ORAL_TABLET | Freq: Every day | ORAL | Status: DC
Start: 2015-11-19 — End: 2016-04-10

## 2015-11-28 ENCOUNTER — Encounter: Payer: Self-pay | Admitting: Orthopaedic Surgery

## 2015-11-28 ENCOUNTER — Ambulatory Visit (INDEPENDENT_AMBULATORY_CARE_PROVIDER_SITE_OTHER): Payer: Medicaid Other | Admitting: Orthopaedic Surgery

## 2015-11-28 ENCOUNTER — Telehealth: Payer: Self-pay | Admitting: Orthopaedic Surgery

## 2015-11-28 VITALS — BP 130/81 | HR 78 | Temp 97.3°F | Resp 16 | Ht 66.0 in | Wt 273.0 lb

## 2015-11-28 DIAGNOSIS — I10 Essential (primary) hypertension: Secondary | ICD-10-CM

## 2015-11-28 DIAGNOSIS — M25562 Pain in left knee: Secondary | ICD-10-CM | POA: Diagnosis not present

## 2015-11-28 NOTE — Telephone Encounter (Signed)
Patient wants prescription for Synvisc.  She wants to get this filled before her insurance runs out at the end of the month.

## 2015-11-28 NOTE — Progress Notes (Signed)
CC:  I have pain of my left knee. I would like an injection.  The patient has chronic pain of the left knee.  There is no recent trauma.  There is no redness.  Injections in the past have helped.  The knee has no redness, has an effusion and crepitus present.  ROM of the knee is 0-105.  Impression:  Chronic knee pain left.  Return: one month  PROCEDURE NOTE:  The patient requests injections of the left knee , verbal consent was obtained.  The left knee was prepped appropriately after time out was performed.   Sterile technique was observed and injection of 1 cc of Depo-Medrol 40 mg with several cc's of plain xylocaine. Anesthesia was provided by ethyl chloride and a 20-gauge needle was used to inject the knee area. The injection was tolerated well.  A band aid dressing was applied.  The patient was advised to apply ice later today and tomorrow to the injection sight as needed.  Electronically Signed Sanjuana Kava, MD 6/28/201710:35 AM

## 2015-12-11 ENCOUNTER — Ambulatory Visit (INDEPENDENT_AMBULATORY_CARE_PROVIDER_SITE_OTHER): Payer: Medicaid Other | Admitting: Family Medicine

## 2015-12-11 ENCOUNTER — Encounter: Payer: Self-pay | Admitting: Family Medicine

## 2015-12-11 VITALS — BP 132/80 | HR 80 | Resp 18 | Ht 68.0 in | Wt 271.0 lb

## 2015-12-11 DIAGNOSIS — R7303 Prediabetes: Secondary | ICD-10-CM

## 2015-12-11 DIAGNOSIS — N3001 Acute cystitis with hematuria: Secondary | ICD-10-CM | POA: Diagnosis not present

## 2015-12-11 DIAGNOSIS — Z1231 Encounter for screening mammogram for malignant neoplasm of breast: Secondary | ICD-10-CM | POA: Diagnosis not present

## 2015-12-11 DIAGNOSIS — I10 Essential (primary) hypertension: Secondary | ICD-10-CM

## 2015-12-11 LAB — POCT URINALYSIS DIPSTICK
Bilirubin, UA: NEGATIVE
Glucose, UA: NEGATIVE
Nitrite, UA: NEGATIVE
Spec Grav, UA: 1.02
Urobilinogen, UA: 0.2
pH, UA: 6

## 2015-12-11 MED ORDER — VITAMIN D (ERGOCALCIFEROL) 1.25 MG (50000 UNIT) PO CAPS: 50000.0000 [IU] | ORAL_CAPSULE | ORAL | Status: DC

## 2015-12-11 MED ORDER — CIPROFLOXACIN HCL 500 MG PO TABS: 500.0000 mg | ORAL_TABLET | Freq: Two times a day (BID) | ORAL | Status: DC

## 2015-12-11 NOTE — Patient Instructions (Signed)
F/u in 5 month, call if you need me sooner  Pls get labs, non fasting as soon as possible  Please get your mammogram and colonoscopy,  both are due  CONGRATS on 7 pound weight loss, pls continue to work on this  You appear to have a urinary tract infection , 3 day course of ciprofloxacin is prescribed, and urine is sent for further testing Thank you  for choosing [facility]. We consider it a privelige to serve you.  Delivering excellent health care in a caring and  compassionate way is our goal.  Partnering with you,  so that together we can achieve this goal is our strategy.   \

## 2015-12-12 ENCOUNTER — Other Ambulatory Visit (HOSPITAL_COMMUNITY)
Admission: RE | Admit: 2015-12-12 | Discharge: 2015-12-12 | Disposition: A | Discharge status: Home / Self Care | Payer: Medicaid Other | Source: Ambulatory Visit | Attending: Family Medicine | Admitting: Family Medicine

## 2015-12-12 DIAGNOSIS — N3001 Acute cystitis with hematuria: Secondary | ICD-10-CM | POA: Diagnosis present

## 2015-12-13 LAB — URINE CULTURE

## 2015-12-15 DIAGNOSIS — N3001 Acute cystitis with hematuria: Secondary | ICD-10-CM | POA: Insufficient documentation

## 2015-12-15 NOTE — Progress Notes (Signed)
Suzanne Andrews     MRN: [id number]      DOB: [birth year]   HPI Suzanne Andrews is here for follow up and re-evaluation of chronic medical conditions, medication management and review of any available recent lab and radiology data.  Preventive health is updated, specifically  Cancer screening and Immunization.  Past due still and states she is having issues with her medical insurance which is why she continues to hold off on some of th recommended tests 3 day h/o burning, pressure and frequency with urination, no fever, chills or flank pain  Working on weight loss through change in eating habits  ROS Denies recent fever or chills. Denies sinus pressure, nasal congestion, ear pain or sore throat. Denies chest congestion, productive cough or wheezing. Denies chest pains, palpitations and leg swelling Denies abdominal pain, nausea, vomiting,diarrhea or constipation.    Chronic  joint pain, swelling and limitation in mobility.Followed by orthopedics Denies headaches, seizures, numbness, or tingling. Denies depression, anxiety or insomnia. Denies skin break down or rash.   PE  BP 132/80 mmHg  Pulse 80  Resp 18  Ht 5\' 8"  (1.727 m)  Wt 271 lb (122.925 kg)  BMI 41.22 kg/m2  SpO2 100%  Patient alert and oriented and in no cardiopulmonary distress.  HEENT: No facial asymmetry, EOMI,   oropharynx pink and moist.  Neck supple no JVD, no mass.  Chest: Clear to auscultation bilaterally.  CVS: S1, S2 no murmurs, no S3.Regular rate.  ABD: Soft non tender.   Ext: No edema  MS: Adequate though reduced  ROM spine, shoulders, hips and knees.  Skin: Intact, no ulcerations or rash noted.  Psych: Good eye contact, normal affect. Memory intact not anxious or depressed appearing.  CNS: CN 2-12 intact, power,  normal throughout.no focal deficits noted.   Assessment & Plan  Acute cystitis with hematuria Symptomatic with abn UA short antibiotic course prescribed, pt encouraged to ensure  adequate water intake  Essential hypertension Controlled, no change in medication DASH diet and commitment to daily physical activity for a minimum of 30 minutes discussed and encouraged, as a part of hypertension management. The importance of attaining a healthy weight is also discussed.  BP/Weight 12/11/2015 11/28/2015 10/31/2015 09/27/2015 09/20/2015 123456 0000000  Systolic BP Q000111Q [id number] 123XX123 Q000111Q [id number]  Diastolic BP 80 81 79 83 74 81 82  Wt. (Lbs) 271 273 284 282 282 282 282.8  BMI 41.22 44.08 45.86 45.54 45.54 45.54 45.67        Morbid obesity (Adrian) Improived Patient re-educated about  the importance of commitment to a  minimum of 150 minutes of exercise per week.  The importance of healthy food choices with portion control discussed. Encouraged to start a food diary, count calories and to consider  joining a support group. Sample diet sheets offered. Goals set by the patient for the next several months.   Weight /BMI 12/11/2015 11/28/2015 10/31/2015  WEIGHT 271 lb 273 lb 284 lb  HEIGHT 5\' 8"  5\' 6"  5\' 6"   BMI 41.22 kg/m2 44.08 kg/m2 45.86 kg/m2       Prediabetes Patient educated about the importance of limiting  Carbohydrate intake , the need to commit to daily physical activity for a minimum of 30 minutes , and to commit weight loss. The fact that changes in all these areas will reduce or eliminate all together the development of diabetes is stressed.   Diabetic Labs Latest Ref Rng 05/15/2015 07/12/2014 07/15/2013 02/24/2013 02/12/2012  HbA1c <5.7 % - -  5.7(H) 6.0(H) -  Chol 125 - 200 mg/dL 171 - - 171 -  HDL >=46 mg/dL 63 - - 60 -  Calc LDL <130 mg/dL 96 - - 102(H) -  Triglycerides <150 mg/dL 62 - - 46 -  Creatinine 0.50 - 1.05 mg/dL 0.72 0.69 0.70 0.76 0.82   BP/Weight 12/11/2015 11/28/2015 10/31/2015 09/27/2015 09/20/2015 123456 0000000  Systolic BP Q000111Q [id number] 123XX123 123XX123 Q000111Q [id number]  Diastolic BP 80 81 79 83 74 81 82  Wt. (Lbs) 271 273 284 282 282 282 282.8  BMI 41.22  44.08 45.86 45.54 45.54 45.54 45.67   No flowsheet data found.   Updated lab needed at/ before next visit.

## 2015-12-15 NOTE — Assessment & Plan Note (Signed)
Improived Patient re-educated about  the importance of commitment to a  minimum of 150 minutes of exercise per week.  The importance of healthy food choices with portion control discussed. Encouraged to start a food diary, count calories and to consider  joining a support group. Sample diet sheets offered. Goals set by the patient for the next several months.   Weight /BMI 12/11/2015 11/28/2015 10/31/2015  WEIGHT 271 lb 273 lb 284 lb  HEIGHT 5\' 8"  5\' 6"  5\' 6"   BMI 41.22 kg/m2 44.08 kg/m2 45.86 kg/m2

## 2015-12-15 NOTE — Assessment & Plan Note (Signed)
Patient educated about the importance of limiting  Carbohydrate intake , the need to commit to daily physical activity for a minimum of 30 minutes , and to commit weight loss. The fact that changes in all these areas will reduce or eliminate all together the development of diabetes is stressed.   Diabetic Labs Latest Ref Rng 05/15/2015 07/12/2014 07/15/2013 02/24/2013 02/12/2012  HbA1c <5.7 % - - 5.7(H) 6.0(H) -  Chol 125 - 200 mg/dL 171 - - 171 -  HDL >=46 mg/dL 63 - - 60 -  Calc LDL <130 mg/dL 96 - - 102(H) -  Triglycerides <150 mg/dL 62 - - 46 -  Creatinine 0.50 - 1.05 mg/dL 0.72 0.69 0.70 0.76 0.82   BP/Weight 12/11/2015 11/28/2015 10/31/2015 09/27/2015 09/20/2015 123456 0000000  Systolic BP Q000111Q [id number] 123XX123 123XX123 Q000111Q A999333 [id number]  Diastolic BP 80 81 79 83 74 81 82  Wt. (Lbs) 271 273 284 282 282 282 282.8  BMI 41.22 44.08 45.86 45.54 45.54 45.54 45.67   No flowsheet data found.   Updated lab needed at/ before next visit.

## 2015-12-15 NOTE — Assessment & Plan Note (Signed)
Symptomatic with abn UA short antibiotic course prescribed, pt encouraged to ensure adequate water intake

## 2015-12-15 NOTE — Assessment & Plan Note (Signed)
Controlled, no change in medication DASH diet and commitment to daily physical activity for a minimum of 30 minutes discussed and encouraged, as a part of hypertension management. The importance of attaining a healthy weight is also discussed.  BP/Weight 12/11/2015 11/28/2015 10/31/2015 09/27/2015 09/20/2015 123456 0000000  Systolic BP Q000111Q [id number] 123XX123 Q000111Q [id number]  Diastolic BP 80 81 79 83 74 81 82  Wt. (Lbs) 271 273 284 282 282 282 282.8  BMI 41.22 44.08 45.86 45.54 45.54 45.54 45.67

## 2015-12-26 ENCOUNTER — Ambulatory Visit: Payer: Medicaid Other | Admitting: Orthopaedic Surgery

## 2016-03-12 ENCOUNTER — Telehealth: Payer: Self-pay | Admitting: Orthopaedic Surgery

## 2016-03-12 NOTE — Telephone Encounter (Signed)
ROUTING TO DR KEELING 

## 2016-03-12 NOTE — Telephone Encounter (Signed)
Patient called to request prescription for her Supartz medication, for the series of 3 injections.  States Dr Luna Glasgow had ordered through [facility], in his previous medical records system.  Patient was last seen here 12/26/15.  Please advise. Patient's ph# [phone]

## 2016-03-19 ENCOUNTER — Telehealth: Payer: Self-pay | Admitting: *Deleted

## 2016-03-19 NOTE — Telephone Encounter (Signed)
Patient states that her [place] [employer] will cover Synvisc Injections. (QTY 3) She states they have covered this in the past. She is asking if you will write a prescription for this and she will pick it up.

## 2016-03-21 NOTE — Telephone Encounter (Signed)
Patient called back today, 03/21/16 - states received a call back from our office (?[name]) regarding status of the Synvisc medication.  States she picked up the new (handwritten) prescription last week, 03/13/16.  She relays that she took it to [facility], and she is asking if we can "send it back".  We have no prescription here to send.  Please advise.  I have also discussed activation of MyChart with patient and have sent her the link as requested.  Her cell# [phone]

## 2016-03-25 ENCOUNTER — Telehealth: Payer: Self-pay | Admitting: Orthopaedic Surgery

## 2016-03-27 ENCOUNTER — Ambulatory Visit (INDEPENDENT_AMBULATORY_CARE_PROVIDER_SITE_OTHER): Payer: Medicaid Other | Admitting: Orthopaedic Surgery

## 2016-03-27 DIAGNOSIS — M25562 Pain in left knee: Secondary | ICD-10-CM

## 2016-03-27 DIAGNOSIS — G8929 Other chronic pain: Secondary | ICD-10-CM

## 2016-03-27 DIAGNOSIS — I10 Essential (primary) hypertension: Secondary | ICD-10-CM

## 2016-03-27 NOTE — Progress Notes (Signed)
PROCEDURE NOTE:  Synvisc injection #  1 of 3   Injection 1 vial of Synvisc into left  knee  There were no vitals taken for this visit.  The left knee exam: there was no synovitis or infection   The knee was prepped sterilely  Ethyl chloride was used to anesthetize the skin A 20 g needle was used to inject the knee with 1 vial of Synvisc A sterile dressing was placed  There were no complications  Encounter Diagnoses  Name Primary?  . Chronic pain of left knee Yes  . Essential hypertension     Follow up one week  Electronically Signed Sanjuana Kava, MD 10/26/20179:56 AM

## 2016-04-02 ENCOUNTER — Encounter: Payer: Self-pay | Admitting: Orthopaedic Surgery

## 2016-04-02 ENCOUNTER — Ambulatory Visit (INDEPENDENT_AMBULATORY_CARE_PROVIDER_SITE_OTHER): Payer: Medicaid Other | Admitting: Orthopaedic Surgery

## 2016-04-02 VITALS — BP 109/72 | HR 75 | Temp 97.3°F | Ht 68.0 in | Wt 279.0 lb

## 2016-04-02 DIAGNOSIS — I10 Essential (primary) hypertension: Secondary | ICD-10-CM

## 2016-04-02 DIAGNOSIS — G8929 Other chronic pain: Secondary | ICD-10-CM | POA: Diagnosis not present

## 2016-04-02 DIAGNOSIS — M25562 Pain in left knee: Secondary | ICD-10-CM

## 2016-04-02 NOTE — Progress Notes (Signed)
PROCEDURE NOTE:  Synvisc injection #  2 of 3   Injection 1 vial of Synvisc into left  knee  BP 109/72   Pulse 75   Temp 97.3 F (36.3 C)   Ht 5\' 8"  (1.727 m)   Wt 279 lb (126.6 kg)   BMI 42.42 kg/m   The left knee exam: there was no synovitis or infection   The knee was prepped sterilely  Ethyl chloride was used to anesthetize the skin A 20 g needle was used to inject the knee with 1 vial of Synvisc A sterile dressing was placed  There were no complications  Encounter Diagnoses  Name Primary?  . Chronic pain of left knee Yes  . Essential hypertension     Follow up one week  Call if any problem.  Electronically Signed Sanjuana Kava, MD 11/1/201710:19 AM

## 2016-04-10 ENCOUNTER — Other Ambulatory Visit: Payer: Self-pay | Admitting: Family Medicine

## 2016-04-10 ENCOUNTER — Ambulatory Visit (INDEPENDENT_AMBULATORY_CARE_PROVIDER_SITE_OTHER): Payer: Medicaid Other | Admitting: Orthopaedic Surgery

## 2016-04-10 ENCOUNTER — Encounter: Payer: Self-pay | Admitting: Orthopaedic Surgery

## 2016-04-10 DIAGNOSIS — G8929 Other chronic pain: Secondary | ICD-10-CM | POA: Diagnosis not present

## 2016-04-10 DIAGNOSIS — M25562 Pain in left knee: Secondary | ICD-10-CM

## 2016-04-10 NOTE — Progress Notes (Signed)
PROCEDURE NOTE:  Synvisc injection #  3 of 3   Injection 1 vial of Synvisc into left  knee  There were no vitals taken for this visit.  The left knee exam: there was no synovitis or infection   The knee was prepped sterilely  Ethyl chloride was used to anesthetize the skin A 20 g needle was used to inject the knee with 1 vial of Synvisc A sterile dressing was placed  There were no complications  Encounter Diagnosis  Name Primary?  . Chronic pain of left knee Yes    Follow up one month  Electronically Signed Sanjuana Kava, MD 11/9/201710:01 AM

## 2016-04-28 ENCOUNTER — Ambulatory Visit: Payer: Medicaid Other | Admitting: Family Medicine

## 2016-05-02 ENCOUNTER — Telehealth: Payer: Self-pay | Admitting: Orthopaedic Surgery

## 2016-05-02 NOTE — Telephone Encounter (Signed)
Patient is asking for Synvisc prescription for her right knee.

## 2016-05-08 ENCOUNTER — Ambulatory Visit (INDEPENDENT_AMBULATORY_CARE_PROVIDER_SITE_OTHER): Payer: Medicaid Other | Admitting: Orthopaedic Surgery

## 2016-05-08 DIAGNOSIS — M25561 Pain in right knee: Secondary | ICD-10-CM

## 2016-05-08 DIAGNOSIS — G8929 Other chronic pain: Secondary | ICD-10-CM

## 2016-05-08 NOTE — Progress Notes (Signed)
PROCEDURE NOTE:  Synvisc injection #  1 of 3   Injection 1 vial of Synvisc into right  knee  There were no vitals taken for this visit.  The right knee exam: there was no synovitis or infection   The knee was prepped sterilely  Ethyl chloride was used to anesthetize the skin A 20 g needle was used to inject the knee with 1 vial of Synvisc A sterile dressing was placed  There were no complications  Encounter Diagnosis  Name Primary?  . Chronic pain of right knee Yes    Follow up one week  Electronically White, MD 12/7/20174:04 PM

## 2016-05-08 NOTE — Patient Instructions (Signed)
1st synvisc injection  Lot [id number] Exp 2020-05

## 2016-05-13 ENCOUNTER — Ambulatory Visit: Payer: Medicaid Other | Admitting: Family Medicine

## 2016-05-15 ENCOUNTER — Ambulatory Visit (INDEPENDENT_AMBULATORY_CARE_PROVIDER_SITE_OTHER): Payer: Medicaid Other | Admitting: Orthopaedic Surgery

## 2016-05-15 DIAGNOSIS — G8929 Other chronic pain: Secondary | ICD-10-CM

## 2016-05-15 DIAGNOSIS — M25561 Pain in right knee: Secondary | ICD-10-CM

## 2016-05-15 NOTE — Progress Notes (Signed)
PROCEDURE NOTE:  Synvisc injection #  2 of 3   Injection 1 vial of Synvisc into right  knee  There were no vitals taken for this visit.  The right knee exam: there was no synovitis or infection   The knee was prepped sterilely  Ethyl chloride was used to anesthetize the skin A 20 g needle was used to inject the knee with 1 vial of Synvisc A sterile dressing was placed  There were no complications  Encounter Diagnosis  Name Primary?  . Chronic pain of right knee Yes    Follow up one week

## 2016-05-21 ENCOUNTER — Encounter: Payer: Self-pay | Admitting: Orthopaedic Surgery

## 2016-05-21 ENCOUNTER — Ambulatory Visit (INDEPENDENT_AMBULATORY_CARE_PROVIDER_SITE_OTHER): Payer: Medicaid Other | Admitting: Orthopaedic Surgery

## 2016-05-21 DIAGNOSIS — M25561 Pain in right knee: Secondary | ICD-10-CM

## 2016-05-21 DIAGNOSIS — G8929 Other chronic pain: Secondary | ICD-10-CM | POA: Diagnosis not present

## 2016-05-21 NOTE — Progress Notes (Signed)
PROCEDURE NOTE:  Synvisc injection #  3 of 3   Injection 1 vial of Synvisc into right  knee  There were no vitals taken for this visit.  The right knee exam: there was no synovitis or infection   The knee was prepped sterilely  Ethyl chloride was used to anesthetize the skin A 20 g needle was used to inject the knee with 1 vial of Synvisc A sterile dressing was placed  There were no complications  Encounter Diagnosis  Name Primary?  . Chronic pain of right knee Yes    Follow up one month.  Electronically Signed Sanjuana Kava, MD 12/20/20172:59 PM

## 2016-06-18 ENCOUNTER — Ambulatory Visit: Payer: Medicaid Other | Admitting: Orthopaedic Surgery

## 2016-06-25 ENCOUNTER — Encounter: Payer: Self-pay | Admitting: Orthopaedic Surgery

## 2016-06-25 ENCOUNTER — Ambulatory Visit (INDEPENDENT_AMBULATORY_CARE_PROVIDER_SITE_OTHER): Payer: Medicaid Other | Admitting: Orthopaedic Surgery

## 2016-06-25 VITALS — BP 127/81 | HR 73 | Temp 97.3°F | Ht 68.0 in

## 2016-06-25 DIAGNOSIS — M25562 Pain in left knee: Secondary | ICD-10-CM | POA: Diagnosis not present

## 2016-06-25 DIAGNOSIS — G8929 Other chronic pain: Secondary | ICD-10-CM | POA: Diagnosis not present

## 2016-06-25 NOTE — Progress Notes (Signed)
Patient [name], female DOB:Apr 23, 1966, 51 y.o. [id number]  No chief complaint on file.   HPI  Suzanne Andrews is a 51 y.o. female who has chronic pain of both knees, more on the left recently.  She has had Synvisc injections last month. She has had more pain with the really cold weather and snow we had last week. She has no new trauma, no giving way.  She is taking her medicine. HPI  There is no height or weight on file to calculate BMI.  ROS  Review of Systems  HENT: Negative for congestion.   Respiratory: Negative for cough and shortness of breath.   Cardiovascular: Negative for chest pain and leg swelling.  Endocrine: Positive for cold intolerance.  Musculoskeletal: Positive for arthralgias, gait problem and joint swelling.  Allergic/Immunologic: Positive for environmental allergies.    Past Medical History:  Diagnosis Date  . Hypertension     Past Surgical History:  Procedure Laterality Date  . CESAREAN SECTION     x 2  . CHOLECYSTECTOMY    . KNEE ARTHROSCOPY WITH MEDIAL MENISECTOMY Left 07/18/2014   Procedure: LEFT KNEE ARTHROSCOPY WITH MEDIAL AND LATERAL MENISECTOMY;  Surgeon: Sanjuana Kava, MD;  Location: [facility];  Service: Orthopedics;  Laterality: Left;  . TUBAL LIGATION    . vascular surgery bilateral leg      Family History  Problem Relation Age of Onset  . Hypertension Mother   . Cancer Father   . Hypertension Sister   . Cancer Sister     breast  . Cancer Brother   . Hypertension Brother   . Cancer Brother     Social History Social History  Substance Use Topics  . Smoking status: Never Smoker  . Smokeless tobacco: Never Used  . Alcohol use No    No Known Allergies  Current Outpatient Prescriptions  Medication Sig Dispense Refill  . ciprofloxacin (CIPRO) 500 MG tablet Take 1 tablet (500 mg total) by mouth 2 (two) times daily. 6 tablet 0  . lisinopril-hydrochlorothiazide (PRINZIDE,ZESTORETIC) 20-25 MG tablet TAKE ONE TABLET BY  MOUTH ONCE DAILY 30 tablet 3  . loratadine (CLARITIN) 10 MG tablet Take 10 mg by mouth daily as needed for allergies.     . naproxen (NAPROSYN) 500 MG tablet Take 500 mg by mouth 2 (two) times daily with a meal.    . SYNVISC 16 MG/2ML SOSY INJECT INTO KNEE ONCE WEEKLY FOR 3 WEEKS 3 Syringe 0  . traMADol (ULTRAM) 50 MG tablet Take 1 tablet (50 mg total) by mouth every 6 (six) hours as needed. 60 tablet 3  . Vitamin D, Ergocalciferol, (DRISDOL) 50000 units CAPS capsule Take 1 capsule (50,000 Units total) by mouth once a week. 12 capsule 0   No current facility-administered medications for this visit.      Physical Exam  Blood pressure 127/81, pulse 73, temperature 97.3 F (36.3 C), height 5\' 8"  (1.727 m).  Constitutional: overall normal hygiene, normal nutrition, well developed, normal grooming, normal body habitus. Assistive device:none  Musculoskeletal: gait and station Limp left, muscle tone and strength are normal, no tremors or atrophy is present.  .  Neurological: coordination overall normal.  Deep tendon reflex/nerve stretch intact.  Sensation normal.  Cranial nerves II-XII intact.   Skin:   Normal overall no scars, lesions, ulcers or rashes. No psoriasis.  Psychiatric: Alert and oriented x 3.  Recent memory intact, remote memory unclear.  Normal mood and affect. Well groomed.  Good eye contact.  Cardiovascular: overall  no swelling, no varicosities, no edema bilaterally, normal temperatures of the legs and arms, no clubbing, cyanosis and good capillary refill.  Lymphatic: palpation is normal.  The left lower extremity is examined:  Inspection:  Thigh:  Non-tender and no defects  Knee has swelling 1+ effusion.                        Joint tenderness is present                        Patient is tender over the medial joint line  Lower Leg:  Has normal appearance and no tenderness or defects  Ankle:  Non-tender and no defects  Foot:  Non-tender and no defects Range of  Motion:  Knee:  Range of motion is: 0-110                        Crepitus is  present  Ankle:  Range of motion is normal. Strength and Tone:  The left lower extremity has normal strength and tone. Stability:  Knee:  The knee is stable.  Ankle:  The ankle is stable.    The patient has been educated about the nature of the problem(s) and counseled on treatment options.  The patient appeared to understand what I have discussed and is in agreement with it.  Encounter Diagnosis  Name Primary?  . Chronic pain of left knee Yes    PLAN Call if any problems.  Precautions discussed.  Continue current medications.   Return to clinic 6 weeks   Electronically Signed Sanjuana Kava, MD 1/24/20183:27 PM

## 2016-08-12 ENCOUNTER — Ambulatory Visit: Payer: Medicaid Other | Admitting: Orthopaedic Surgery

## 2016-09-19 ENCOUNTER — Ambulatory Visit (HOSPITAL_COMMUNITY)
Admission: RE | Admit: 2016-09-19 | Discharge: 2016-09-19 | Disposition: A | Discharge status: Home / Self Care | Payer: Medicaid Other | Source: Ambulatory Visit | Attending: Family Medicine | Admitting: Family Medicine

## 2016-09-19 DIAGNOSIS — Z1231 Encounter for screening mammogram for malignant neoplasm of breast: Secondary | ICD-10-CM | POA: Insufficient documentation

## 2016-10-20 ENCOUNTER — Other Ambulatory Visit: Payer: Self-pay | Admitting: Family Medicine

## 2016-10-28 ENCOUNTER — Encounter: Payer: Self-pay | Admitting: Orthopaedic Surgery

## 2016-10-28 ENCOUNTER — Ambulatory Visit (INDEPENDENT_AMBULATORY_CARE_PROVIDER_SITE_OTHER): Payer: Medicaid Other | Admitting: Orthopaedic Surgery

## 2016-10-28 VITALS — BP 145/85 | HR 77 | Temp 97.5°F | Ht 68.0 in | Wt 288.0 lb

## 2016-10-28 DIAGNOSIS — M25562 Pain in left knee: Secondary | ICD-10-CM | POA: Diagnosis not present

## 2016-10-28 DIAGNOSIS — M25561 Pain in right knee: Secondary | ICD-10-CM | POA: Diagnosis not present

## 2016-10-28 DIAGNOSIS — G8929 Other chronic pain: Secondary | ICD-10-CM | POA: Diagnosis not present

## 2016-10-28 NOTE — Progress Notes (Signed)
Patient WU:[id number] CREASIE LACOSSE, female DOB:[birth year], 51 y.o. YNW:[id number]  Chief Complaint  Patient presents with  . Follow-up    Chronic pain left knee    HPI  LOZA PRELL is a 51 y.o. female who has chronic pain of the knees.  She is ready for new Synvisc injections.  I have given Rx for these depending on her insurance company permission.  She has more swelling of the knees, no giving way,no locking. HPI  Body mass index is 43.79 kg/m.  ROS  Review of Systems  HENT: Negative for congestion.   Respiratory: Negative for cough and shortness of breath.   Cardiovascular: Negative for chest pain and leg swelling.  Endocrine: Positive for cold intolerance.  Musculoskeletal: Positive for arthralgias, gait problem and joint swelling.  Allergic/Immunologic: Positive for environmental allergies.    Past Medical History:  Diagnosis Date  . Hypertension     Past Surgical History:  Procedure Laterality Date  . CESAREAN SECTION     x 2  . CHOLECYSTECTOMY    . KNEE ARTHROSCOPY WITH MEDIAL MENISECTOMY Left 07/18/2014   Procedure: LEFT KNEE ARTHROSCOPY WITH MEDIAL AND LATERAL MENISECTOMY;  Surgeon: Sanjuana Kava, MD;  Location: [facility];  Service: Orthopedics;  Laterality: Left;  . TUBAL LIGATION    . vascular surgery bilateral leg      Family History  Problem Relation Age of Onset  . Hypertension Mother   . Cancer Father   . Hypertension Sister   . Cancer Sister        breast  . Cancer Brother   . Hypertension Brother   . Cancer Brother     Social History Social History  Substance Use Topics  . Smoking status: Never Smoker  . Smokeless tobacco: Never Used  . Alcohol use No    No Known Allergies  Current Outpatient Prescriptions  Medication Sig Dispense Refill  . ciprofloxacin (CIPRO) 500 MG tablet Take 1 tablet (500 mg total) by mouth 2 (two) times daily. 6 tablet 0  . lisinopril-hydrochlorothiazide (PRINZIDE,ZESTORETIC) 20-25 MG tablet TAKE ONE TABLET BY  MOUTH ONCE DAILY 90 tablet 1  . loratadine (CLARITIN) 10 MG tablet Take 10 mg by mouth daily as needed for allergies.     . naproxen (NAPROSYN) 500 MG tablet Take 500 mg by mouth 2 (two) times daily with a meal.    . SYNVISC 16 MG/2ML SOSY INJECT INTO KNEE ONCE WEEKLY FOR 3 WEEKS 3 Syringe 0  . traMADol (ULTRAM) 50 MG tablet Take 1 tablet (50 mg total) by mouth every 6 (six) hours as needed. 60 tablet 3  . Vitamin D, Ergocalciferol, (DRISDOL) 50000 units CAPS capsule Take 1 capsule (50,000 Units total) by mouth once a week. 12 capsule 0   No current facility-administered medications for this visit.      Physical Exam  Blood pressure (!) 145/85, pulse 77, temperature 97.5 F (36.4 C), height 5\' 8"  (1.727 m), weight 288 lb (130.6 kg).  Constitutional: overall normal hygiene, normal nutrition, well developed, normal grooming, normal body habitus. Assistive device:none  Musculoskeletal: gait and station Limp left, muscle tone and strength are normal, no tremors or atrophy is present.  .  Neurological: coordination overall normal.  Deep tendon reflex/nerve stretch intact.  Sensation normal.  Cranial nerves II-XII intact.   Skin:   Normal overall no scars, lesions, ulcers or rashes. No psoriasis.  Psychiatric: Alert and oriented x 3.  Recent memory intact, remote memory unclear.  Normal mood and affect. Well groomed.  Good eye contact.  Cardiovascular: overall no swelling, no varicosities, no edema bilaterally, normal temperatures of the legs and arms, no clubbing, cyanosis and good capillary refill.  Lymphatic: palpation is normal.  The bilateral lower extremity is examined:  Inspection:  Thigh:  Non-tender and no defects  Knee has swelling 1+ effusion.                        Joint tenderness is present                        Patient is tender over the medial joint line  Lower Leg:  Has normal appearance and no tenderness or defects  Ankle:  Non-tender and no defects  Foot:   Non-tender and no defects Range of Motion:  Knee:  Range of motion is: 0-105 left, 0 to 110 right                        Crepitus is  present  Ankle:  Range of motion is normal. Strength and Tone:  The bilateral lower extremity has normal strength and tone. Stability:  Knee:  The knee is stable.  Ankle:  The ankle is stable.    The patient has been educated about the nature of the problem(s) and counseled on treatment options.  The patient appeared to understand what I have discussed and is in agreement with it.  Encounter Diagnoses  Name Primary?  . Chronic pain of left knee Yes  . Chronic pain of right knee     PLAN Call if any problems.  Precautions discussed.  Continue current medications.   Return to clinic after getting the Synvisc.  Rx given.   Electronically Signed Sanjuana Kava, MD 5/29/20183:20 PM

## 2016-11-03 ENCOUNTER — Telehealth: Payer: Self-pay | Admitting: Orthopaedic Surgery

## 2016-11-03 NOTE — Telephone Encounter (Signed)
Then find out what is covered.  Ask Suzanne Andrews if we can just order the Supartz here.

## 2016-11-03 NOTE — Telephone Encounter (Addendum)
Patient called, states has taken the prescription written for Synvisc, for injections.  Last office note indicates "I have given Rx for these depending on her insurance company permission"   Patient said she has left the prescription at [facility], [place], and was told that we would need to find a medication that is covered by her insurer, Medicaid, as she states neither Supartz or Synvisc are covered. Please advise. Cell Ph [phone]

## 2016-11-04 NOTE — Telephone Encounter (Signed)
She was given a Rx for Supartz and told to take it to [place] .

## 2016-11-29 ENCOUNTER — Encounter (HOSPITAL_COMMUNITY): Payer: Self-pay

## 2016-11-29 ENCOUNTER — Emergency Department (HOSPITAL_COMMUNITY)
Admission: EM | Admit: 2016-11-29 | Discharge: 2016-11-29 | Disposition: A | Discharge status: Home / Self Care | Payer: Medicaid Other | Attending: Emergency Medicine | Admitting: Emergency Medicine

## 2016-11-29 DIAGNOSIS — S91301A Unspecified open wound, right foot, initial encounter: Secondary | ICD-10-CM | POA: Insufficient documentation

## 2016-11-29 DIAGNOSIS — Z79899 Other long term (current) drug therapy: Secondary | ICD-10-CM | POA: Diagnosis not present

## 2016-11-29 DIAGNOSIS — X58XXXA Exposure to other specified factors, initial encounter: Secondary | ICD-10-CM | POA: Diagnosis not present

## 2016-11-29 DIAGNOSIS — Y998 Other external cause status: Secondary | ICD-10-CM | POA: Insufficient documentation

## 2016-11-29 DIAGNOSIS — Y9389 Activity, other specified: Secondary | ICD-10-CM | POA: Diagnosis not present

## 2016-11-29 DIAGNOSIS — I10 Essential (primary) hypertension: Secondary | ICD-10-CM | POA: Insufficient documentation

## 2016-11-29 DIAGNOSIS — I872 Venous insufficiency (chronic) (peripheral): Secondary | ICD-10-CM | POA: Insufficient documentation

## 2016-11-29 DIAGNOSIS — S90811A Abrasion, right foot, initial encounter: Secondary | ICD-10-CM | POA: Diagnosis present

## 2016-11-29 DIAGNOSIS — I739 Peripheral vascular disease, unspecified: Secondary | ICD-10-CM | POA: Diagnosis not present

## 2016-11-29 DIAGNOSIS — Y929 Unspecified place or not applicable: Secondary | ICD-10-CM | POA: Insufficient documentation

## 2016-11-29 DIAGNOSIS — T148XXA Other injury of unspecified body region, initial encounter: Secondary | ICD-10-CM

## 2016-11-29 HISTORY — DX: Peripheral vascular disease, unspecified: I73.9

## 2016-11-29 MED ORDER — CEPHALEXIN 500 MG PO CAPS: 500.0000 mg | ORAL_CAPSULE | Freq: Four times a day (QID) | ORAL | 0 refills | Status: DC

## 2016-11-29 MED ORDER — BACITRACIN ZINC 500 UNIT/GM EX OINT: 1.0000 "application " | TOPICAL_OINTMENT | Freq: Two times a day (BID) | CUTANEOUS | 0 refills | Status: DC

## 2016-11-29 MED ORDER — NAPROXEN 500 MG PO TABS: 500.0000 mg | ORAL_TABLET | Freq: Two times a day (BID) | ORAL | 0 refills | Status: DC

## 2016-11-29 MED ORDER — BACITRACIN ZINC 500 UNIT/GM EX OINT
TOPICAL_OINTMENT | Freq: Once | CUTANEOUS | Status: AC
  Administered 2016-11-29: 1 via TOPICAL
  Filled 2016-11-29: qty 0.9

## 2016-11-29 NOTE — ED Provider Notes (Signed)
[facility] DEPT Provider Note   CSN: [id number] Arrival date & time: 11/29/16  1037     History   Chief Complaint Chief Complaint  Patient presents with  . Abrasion    HPI Suzanne Andrews is a 50 y.o. female with a history of venous insufficiency presenting with a wound on her dorsal right foot which started as a small abrasion/irritation from rubbing of a new pair of shoes that fit tight across her proximal foot several weeks ago.  It started as a small abrasion, and has expanded to involve an open wound which she has been treating with twice daily warm soap and water followed by xeroform and dressing.  She states is a little smaller than it was but has continued to cause intermittent pain. She denies drainage from the wound site and denies radiation of pain.  She has required wound care for her last wound years ago, treated in [place]. She denies any other complaint including fevers, chills, nausea or other constitutional sx.  The history is provided by the patient.    Past Medical History:  Diagnosis Date  . Hypertension   . PVD (peripheral vascular disease) [facility])     Patient Active Problem List   Diagnosis Date Noted  . Acute cystitis with hematuria 12/15/2015  . Right knee pain 08/02/2015  . Pain in joint, ankle and foot 05/15/2015  . Ovarian cyst, left 04/04/2011  . Prediabetes 04/04/2011  . MENORRHAGIA 10/15/2009  . NEVI, MULTIPLE 08/02/2009  . CAPILLARY HEMANGIOMA 08/02/2009  . Osteoarthritis, knee 08/02/2009  . ALLERGIC RHINITIS 11/21/2008  . Morbid obesity ([place]) 02/01/2008  . Essential hypertension 02/01/2008    Past Surgical History:  Procedure Laterality Date  . CESAREAN SECTION     x 2  . CHOLECYSTECTOMY    . KNEE ARTHROSCOPY WITH MEDIAL MENISECTOMY Left 07/18/2014   Procedure: LEFT KNEE ARTHROSCOPY WITH MEDIAL AND LATERAL MENISECTOMY;  Surgeon: Sanjuana Kava, MD;  Location: [facility];  Service: Orthopedics;  Laterality: Left;  . TUBAL LIGATION    .  vascular surgery bilateral leg      OB History    No data available       Home Medications    Prior to Admission medications   Medication Sig Start Date End Date Taking? Authorizing Provider  bacitracin ointment Apply 1 application topically 2 (two) times daily. 11/29/16   Evalee Jefferson, PA-C  cephALEXin (KEFLEX) 500 MG capsule Take 1 capsule (500 mg total) by mouth 4 (four) times daily. 11/29/16   Evalee Jefferson, PA-C  ciprofloxacin (CIPRO) 500 MG tablet Take 1 tablet (500 mg total) by mouth 2 (two) times daily. 12/11/15   Fayrene Helper, MD  lisinopril-hydrochlorothiazide (PRINZIDE,ZESTORETIC) 20-25 MG tablet TAKE ONE TABLET BY MOUTH ONCE DAILY 10/21/16   Fayrene Helper, MD  loratadine (CLARITIN) 10 MG tablet Take 10 mg by mouth daily as needed for allergies.     [provider]  naproxen (NAPROSYN) 500 MG tablet Take 1 tablet (500 mg total) by mouth 2 (two) times daily. 11/29/16   Evalee Jefferson, PA-C  SYNVISC 16 MG/2ML SOSY INJECT INTO KNEE ONCE WEEKLY FOR 3 WEEKS 03/26/16   Sanjuana Kava, MD  traMADol (ULTRAM) 50 MG tablet Take 1 tablet (50 mg total) by mouth every 6 (six) hours as needed. 10/31/15   Sanjuana Kava, MD  Vitamin D, Ergocalciferol, (DRISDOL) 50000 units CAPS capsule Take 1 capsule (50,000 Units total) by mouth once a week. 12/11/15   Fayrene Helper, MD  Family History Family History  Problem Relation Age of Onset  . Hypertension Mother   . Cancer Father   . Hypertension Sister   . Cancer Sister        breast  . Cancer Brother   . Hypertension Brother   . Cancer Brother     Social History Social History  Substance Use Topics  . Smoking status: Never Smoker  . Smokeless tobacco: Never Used  . Alcohol use No     Allergies   Patient has no known allergies.   Review of Systems Review of Systems  Constitutional: Negative for chills and fever.  Respiratory: Negative for shortness of breath and wheezing.   Skin: Positive for color change  and wound.  Neurological: Negative for numbness.     Physical Exam Updated Vital Signs BP (!) 147/80 (BP Location: Left Arm)   Pulse 71   Temp 98 F (36.7 C) (Oral)   Resp 16   Ht 5\' 8"  (1.727 m)   Wt 130.6 kg (288 lb)   SpO2 99%   BMI 43.79 kg/m   Physical Exam  Constitutional: She appears well-developed and well-nourished. No distress.  HENT:  Head: Normocephalic.  Neck: Neck supple.  Cardiovascular: Normal rate.   Pulses:      Popliteal pulses are 2+ on the right side, and 2+ on the left side.  Pulmonary/Chest: Effort normal. She has no wheezes.  Musculoskeletal: Normal range of motion. She exhibits no edema.  Skin:  Brawny induration bilateral anterior ankle/tibia c/w venous insufficiency.  Dime sized swallow ulceration with granulated base right proximal dorsal foot. No drainage, no surrounding erythema. Site is clean and dry.      ED Treatments / Results  Labs (all labs ordered are listed, but only abnormal results are displayed) Labs Reviewed - No data to display  EKG  EKG Interpretation None       Radiology No results found.  Procedures Procedures (including critical care time)  Medications Ordered in ED Medications  bacitracin ointment (not administered)     Initial Impression / Assessment and Plan / ED Course  I have reviewed the triage vital signs and the nursing notes.  Pertinent labs & imaging results that were available during my care of the patient were reviewed by me and considered in my medical decision making (see chart for details).     Pt with small ulceration right foot secondary, with slow healing suspected from venous insufficiency. Pt has good arterial flow.  Advised elevation, avoid standing and dependency of extremity as much as possible, bid wash, bacitracin, added keflex. Pt to f/u with pcp as she will probably need a new referral to wound care if sx do not continue to improve.    The patient appears reasonably screened  and/or stabilized for discharge and I doubt any other medical condition or other [facility] requiring further screening, evaluation, or treatment in the ED at this time prior to discharge.   Final Clinical Impressions(s) / ED Diagnoses   Final diagnoses:  Wound of skin  Venous stasis dermatitis of both lower extremities  Essential hypertension    New Prescriptions New Prescriptions   BACITRACIN OINTMENT    Apply 1 application topically 2 (two) times daily.   CEPHALEXIN (KEFLEX) 500 MG CAPSULE    Take 1 capsule (500 mg total) by mouth 4 (four) times daily.   NAPROXEN (NAPROSYN) 500 MG TABLET    Take 1 tablet (500 mg total) by mouth 2 (two) times daily.  Evalee Jefferson, PA-C 11/29/16 [place], [place], DO 12/07/16 1104

## 2016-11-29 NOTE — ED Triage Notes (Signed)
Pt reports that she has an area to right foot that she believes was caused by her shoe rubbing the skin. States has used xeroform , neosporin with no relief. States area is oozing

## 2016-11-29 NOTE — Discharge Instructions (Signed)
Apply antibiotic twice daily and keep your wound covered as discussed.  Take the entire course of the antibiotic prescribed.  Elevate your leg when possible.  Walking is healthy, but avoid standing in one place for long periods of time.

## 2016-11-29 NOTE — ED Notes (Signed)
Pt states we made her feel much better already and she was very grateful for the care she received today.

## 2016-12-12 ENCOUNTER — Telehealth: Payer: Self-pay | Admitting: Family Medicine

## 2016-12-12 NOTE — Telephone Encounter (Signed)
No signs of infection- but main concern was stinging and itching around the area. Will try neosporin with pain relief and she is scheduled with dr Meda Coffee on Wednesday and will call if the area gets better

## 2016-12-12 NOTE — Telephone Encounter (Signed)
Patient calling to get a refill on Rx cephALEXin (KEFLEX) 500 MG capsule This was give to her [facility] ED in June for a sore on R foot.  She went to get a refill at [facility], but they told her she would need to contact PCP.  Patient states that Dr. Moshe Cipro is familiar with the place on her foot.    Pt's cb  [phone]

## 2016-12-12 NOTE — Telephone Encounter (Signed)
Will need an office visit before an antibiotic can be s prescribed, please schedule with Dr Meda Coffee if she has any available opening as I knwo I am booked and have a short week next week  If I have cancellations I will work her in if Dr Meda Coffee not available , otherwise she needs to go to urgent care

## 2016-12-17 ENCOUNTER — Ambulatory Visit (INDEPENDENT_AMBULATORY_CARE_PROVIDER_SITE_OTHER): Payer: Medicaid Other | Admitting: Family Medicine

## 2016-12-17 ENCOUNTER — Encounter: Payer: Self-pay | Admitting: Family Medicine

## 2016-12-17 VITALS — BP 136/88 | HR 84 | Temp 97.6°F | Resp 18 | Ht 68.0 in | Wt 285.0 lb

## 2016-12-17 DIAGNOSIS — S91301S Unspecified open wound, right foot, sequela: Secondary | ICD-10-CM

## 2016-12-17 DIAGNOSIS — I872 Venous insufficiency (chronic) (peripheral): Secondary | ICD-10-CM | POA: Diagnosis not present

## 2016-12-17 MED ORDER — NAPROXEN 500 MG PO TABS: 500.0000 mg | ORAL_TABLET | Freq: Two times a day (BID) | ORAL | 0 refills | Status: DC

## 2016-12-17 MED ORDER — CEPHALEXIN 500 MG PO CAPS: 500.0000 mg | ORAL_CAPSULE | Freq: Four times a day (QID) | ORAL | 0 refills | Status: DC

## 2016-12-17 MED ORDER — MUPIROCIN 2 % EX OINT: 1.0000 "application " | TOPICAL_OINTMENT | Freq: Two times a day (BID) | CUTANEOUS | 0 refills | Status: DC

## 2016-12-17 NOTE — Progress Notes (Signed)
Chief Complaint  Patient presents with  . Wound Check    right foot x 2 weeks  acute visit today Chronic venous insufficiency of legs with edema and occasional skin breakdown Currently has wound on her right foot slow to heal Has been tot he ER and took short course of antibiotics Here today because wound is more painful since last night and swollen Worried about infection Is cleaning and using bacitracin daily Cannot wear compression sock due to rubbing and pain No fever or malaise  Patient Active Problem List   Diagnosis Date Noted  . Venous insufficiency of both lower extremities 12/17/2016  . Acute cystitis with hematuria 12/15/2015  . Right knee pain 08/02/2015  . Pain in joint, ankle and foot 05/15/2015  . Ovarian cyst, left 04/04/2011  . Prediabetes 04/04/2011  . MENORRHAGIA 10/15/2009  . NEVI, MULTIPLE 08/02/2009  . CAPILLARY HEMANGIOMA 08/02/2009  . Osteoarthritis, knee 08/02/2009  . ALLERGIC RHINITIS 11/21/2008  . Morbid obesity ([place]) 02/01/2008  . Essential hypertension 02/01/2008    Outpatient Encounter Prescriptions as of 12/17/2016  Medication Sig  . bacitracin ointment Apply 1 application topically 2 (two) times daily.  Marland Kitchen lisinopril-hydrochlorothiazide (PRINZIDE,ZESTORETIC) 20-25 MG tablet TAKE ONE TABLET BY MOUTH ONCE DAILY  . loratadine (CLARITIN) 10 MG tablet Take 10 mg by mouth daily as needed for allergies.   . naproxen (NAPROSYN) 500 MG tablet Take 1 tablet (500 mg total) by mouth 2 (two) times daily. Take with food  . SYNVISC 16 MG/2ML SOSY INJECT INTO KNEE ONCE WEEKLY FOR 3 WEEKS  . traMADol (ULTRAM) 50 MG tablet Take 1 tablet (50 mg total) by mouth every 6 (six) hours as needed.  . Vitamin D, Ergocalciferol, (DRISDOL) 50000 units CAPS capsule Take 1 capsule (50,000 Units total) by mouth once a week.  . cephALEXin (KEFLEX) 500 MG capsule Take 1 capsule (500 mg total) by mouth 4 (four) times daily.  . mupirocin ointment (BACTROBAN) 2 % Place 1  application into the nose 2 (two) times daily.   No facility-administered encounter medications on file as of 12/17/2016.     No Known Allergies  Review of Systems  Constitutional: Negative for activity change, appetite change, chills and fever.  Cardiovascular: Positive for leg swelling.  Skin: Positive for color change and wound.  Psychiatric/Behavioral: Positive for sleep disturbance.  All other systems reviewed and are negative.   BP 136/88 (BP Location: Right Arm, Patient Position: Sitting, Cuff Size: Large)   Pulse 84   Temp 97.6 F (36.4 C) (Temporal)   Resp 18   Ht 5\' 8"  (1.727 m)   Wt 285 lb 0.6 oz (129.3 kg)   SpO2 95%   BMI 43.34 kg/m   Physical Exam  Constitutional: She is oriented to person, place, and time. She appears well-developed and well-nourished. No distress.  HENT:  Head: Normocephalic and atraumatic.  Mouth/Throat: Oropharynx is clear and moist.  Musculoskeletal: Normal range of motion. She exhibits edema.       Feet:  Superficial wound with good granulation on base, thin eschar, no drainage.  Measures 3 by 4 cm.  Surrounding soft tissue swelling and tenderness, faint erythema.  No inguinal nodes  Neurological: She is alert and oriented to person, place, and time.  Psychiatric: She has a normal mood and affect. Her behavior is normal.    ASSESSMENT/PLAN:  1. Open wound of foot excluding toes without complication, right, sequela Early infection suspected 2. Venous insufficiency of both lower extremities    Patient  Instructions  Use the mupirocin ointment on the wound Take the naproxen as needed for pain Take the keflex for infection  See Dr Moshe Cipro as scheduled   Raylene Everts, MD

## 2016-12-17 NOTE — Patient Instructions (Signed)
Use the mupirocin ointment on the wound Take the naproxen as needed for pain Take the keflex for infection  See Dr Moshe Cipro as scheduled

## 2016-12-26 ENCOUNTER — Telehealth: Payer: Self-pay | Admitting: Family Medicine

## 2016-12-26 NOTE — Telephone Encounter (Signed)
Patient called, she has a sore on foot being treated by Dr Moshe Cipro. She was given an antibiotic and ointment to use. Has finished abx and is still using ointment but is still having soreness in area. Asks to be referred to Dr Nils Pyle at wound care center in [place] asap

## 2016-12-26 NOTE — Telephone Encounter (Signed)
I will forward this to Dr Moshe Cipro.  Her wound is small, and shallow, and uncomplicated - about the size of a dime.  I do not think it needs specialty referral.  [facility]

## 2016-12-29 ENCOUNTER — Other Ambulatory Visit: Payer: Self-pay | Admitting: Family Medicine

## 2016-12-29 DIAGNOSIS — S81801S Unspecified open wound, right lower leg, sequela: Secondary | ICD-10-CM

## 2016-12-29 DIAGNOSIS — I872 Venous insufficiency (chronic) (peripheral): Secondary | ICD-10-CM

## 2016-12-29 NOTE — Telephone Encounter (Signed)
Pt tquests eval by wound ctr will refer.This is entered pls address and let pt know

## 2016-12-29 NOTE — Telephone Encounter (Signed)
The referral to wound center is entered

## 2017-01-05 NOTE — Telephone Encounter (Signed)
Patient was seen 12/31/16 @ 9am

## 2017-02-09 ENCOUNTER — Telehealth: Payer: Self-pay | Admitting: Family Medicine

## 2017-02-09 NOTE — Telephone Encounter (Signed)
No recommendations at this time, She needs to schedule and keep an annual physical exam and will need fasting labs for that visit, Routine health maintainance issues are past due

## 2017-02-09 NOTE — Telephone Encounter (Signed)
Patient is requesting information for any over the counter medication she can take for pre menopause, she is having hot flashes.  Cb#:[phone]

## 2017-04-08 ENCOUNTER — Encounter: Payer: Self-pay | Admitting: Family Medicine

## 2017-04-08 ENCOUNTER — Ambulatory Visit (HOSPITAL_COMMUNITY)
Admission: RE | Admit: 2017-04-08 | Discharge: 2017-04-08 | Disposition: A | Discharge status: Home / Self Care | Payer: Self-pay | Source: Ambulatory Visit | Attending: Family Medicine | Admitting: Family Medicine

## 2017-04-08 ENCOUNTER — Ambulatory Visit (INDEPENDENT_AMBULATORY_CARE_PROVIDER_SITE_OTHER): Payer: Self-pay | Admitting: Family Medicine

## 2017-04-08 ENCOUNTER — Telehealth: Payer: Self-pay | Admitting: *Deleted

## 2017-04-08 VITALS — BP 122/80 | HR 80 | Resp 16 | Ht 68.0 in | Wt 286.0 lb

## 2017-04-08 DIAGNOSIS — I872 Venous insufficiency (chronic) (peripheral): Secondary | ICD-10-CM

## 2017-04-08 DIAGNOSIS — R7303 Prediabetes: Secondary | ICD-10-CM

## 2017-04-08 DIAGNOSIS — Z23 Encounter for immunization: Secondary | ICD-10-CM

## 2017-04-08 DIAGNOSIS — M79671 Pain in right foot: Secondary | ICD-10-CM

## 2017-04-08 DIAGNOSIS — I10 Essential (primary) hypertension: Secondary | ICD-10-CM

## 2017-04-08 MED ORDER — SULFAMETHOXAZOLE-TRIMETHOPRIM 800-160 MG PO TABS: 1.0000 | ORAL_TABLET | Freq: Two times a day (BID) | ORAL | 0 refills | Status: DC

## 2017-04-08 MED ORDER — GABAPENTIN 100 MG PO CAPS: 100.0000 mg | ORAL_CAPSULE | Freq: Three times a day (TID) | ORAL | 0 refills | Status: DC

## 2017-04-08 NOTE — Patient Instructions (Addendum)
F/u as before, call if you need me sooner  Please get an X ray of your foot today  Antibiotic tablets and gabapentin are sent to your pharmacy  You need to see Dr Nils Pyle about your foot  .Please gert fasting labs 1 week before your next appointment

## 2017-04-08 NOTE — Telephone Encounter (Signed)
Patient called wanting to see Dr Moshe Cipro for the soreness in her right foot, patient stated she done the wound care thing and she is still having sharpe pains and soreness in her right foot. Please advise

## 2017-04-08 NOTE — Telephone Encounter (Signed)
Patient scheduled for 3:20 today.

## 2017-04-16 LAB — CBC
HCT: 37.9 % (ref 35.0–45.0)
Hemoglobin: 12.8 g/dL (ref 11.7–15.5)
MCH: 29.5 pg (ref 27.0–33.0)
MCHC: 33.8 g/dL (ref 32.0–36.0)
MCV: 87.3 fL (ref 80.0–100.0)
MPV: 10.5 fL (ref 7.5–12.5)
Platelets: 323 10*3/uL (ref 140–400)
RBC: 4.34 10*6/uL (ref 3.80–5.10)
RDW: 12.8 % (ref 11.0–15.0)
WBC: 4.7 10*3/uL (ref 3.8–10.8)

## 2017-04-16 LAB — BASIC METABOLIC PANEL WITH GFR
BUN: 8 mg/dL (ref 7–25)
CO2: 27 mmol/L (ref 20–32)
Calcium: 9.5 mg/dL (ref 8.6–10.4)
Chloride: 102 mmol/L (ref 98–110)
Creat: 0.89 mg/dL (ref 0.50–1.05)
GFR, Est African American: 86 mL/min/{1.73_m2} (ref >=60)
GFR, Est Non African American: 75 mL/min/{1.73_m2} (ref >=60)
Glucose, Bld: 90 mg/dL (ref 65–139)
Potassium: 3.9 mmol/L (ref 3.5–5.3)
Sodium: 137 mmol/L (ref 135–146)

## 2017-04-16 LAB — HEMOGLOBIN A1C
Hgb A1c MFr Bld: 5.5 % of total Hgb (ref <5.7)
Mean Plasma Glucose: 111 (calc)
eAG (mmol/L): 6.2 (calc)

## 2017-04-16 LAB — VITAMIN D 25 HYDROXY (VIT D DEFICIENCY, FRACTURES): Vit D, 25-Hydroxy: 24 ng/mL — ABNORMAL LOW (ref 30–100)

## 2017-04-26 ENCOUNTER — Encounter: Payer: Self-pay | Admitting: Family Medicine

## 2017-04-26 NOTE — Assessment & Plan Note (Signed)
Chronic venous stasis withe recurrent skin ulcers, needs vascular evaluation

## 2017-04-26 NOTE — Assessment & Plan Note (Signed)
Controlled, no change in medication DASH diet and commitment to daily physical activity for a minimum of 30 minutes discussed and encouraged, as a part of hypertension management. The importance of attaining a healthy weight is also discussed.  BP/Weight 04/08/2017 12/17/2016 11/29/2016 10/28/2016 06/25/2016 04/02/2016 09/08/1446  Systolic BP 185 631 497 026 [phone]  Diastolic BP 80 88 80 85 81 72 80  Wt. (Lbs) 286 285.04 288 288 - 279 271  BMI 43.49 43.34 43.79 43.79 - 42.42 41.22

## 2017-04-26 NOTE — Assessment & Plan Note (Signed)
Acute on chronic right foot pain with h/o venous stasis and leg ulcer. X ray of foot, short antibiotic course and gabapentin at bedtime as needed for pain. Needs re evaluation by vascular surgeon

## 2017-04-26 NOTE — Assessment & Plan Note (Signed)
Patient educated about the importance of limiting  Carbohydrate intake , the need to commit to daily physical activity for a minimum of 30 minutes , and to commit weight loss. The fact that changes in all these areas will reduce or eliminate all together the development of diabetes is stressed. Updated lab needed at/ before next visit.   Diabetic Labs Latest Ref Rng & Units 04/15/2017 05/15/2015 07/12/2014 07/15/2013 02/24/2013  HbA1c <5.7 % of total Hgb 5.5 - - 5.7(H) 6.0(H)  Chol 125 - 200 mg/dL - 171 - - 171  HDL >=46 mg/dL - 63 - - 60  Calc LDL <130 mg/dL - 96 - - 102(H)  Triglycerides <150 mg/dL - 62 - - 46  Creatinine 0.50 - 1.05 mg/dL 0.89 0.72 0.69 0.70 0.76   BP/Weight 04/08/2017 12/17/2016 11/29/2016 10/28/2016 06/25/2016 04/02/2016 1/88/6773  Systolic BP 736 681 594 707 [phone]  Diastolic BP 80 88 80 85 81 72 80  Wt. (Lbs) 286 285.04 288 288 - 279 271  BMI 43.49 43.34 43.79 43.79 - 42.42 41.22   No flowsheet data found.

## 2017-04-26 NOTE — Progress Notes (Signed)
ARDITH TEST     MRN: [id number]      DOB: 12/22/1965   HPI Ms. Suzanne Andrews is here with a 1 week h/o increased left foot pain and tenderness. She has chronic venous stasis and suffers from recurrent leg ulcers. She is concerned that there may  Be some underlying problem as she experienced severe sharp pain recently, and also she has had the wound being treated for some time in the wound clinic, and still pain persists and recurs. Needs flu vaccine  ROS Denies recent fever or chills. Denies sinus pressure, nasal congestion, ear pain or sore throat. Denies chest congestion, productive cough or wheezing. Denies chest pains, palpitations and leg swelling Denies abdominal pain, nausea, vomiting,diarrhea or constipation.   Denies dysuria, frequency, hesitancy or incontinence. Denies uncontrolled joint pain, swelling and limitation in mobility. Denies headaches, seizures, numbness, or tingling. Denies depression,  Does c/o anxiety AND mild insomnia. Marland Kitchen   PE  BP 122/80   Pulse 80   Resp 16   Ht 5\' 8"  (1.727 m)   Wt 286 lb (129.7 kg)   SpO2 97%   BMI 43.49 kg/m   Patient alert and oriented and in no cardiopulmonary distress. Anxious HEENT: No facial asymmetry, EOMI,   oropharynx pink and moist.  Neck supple no JVD, no mass.  Chest: Clear to auscultation bilaterally.  CVS: S1, S2 no murmurs, no S3.Regular rate.  ABD: Soft non tender.   Ext: No edema  MS: Adequate ROM spine, shoulders, hips and knees.  Skin: Intact no open lesion visible. Venous stasis of RLE with hyperpigmentation. Tender to palpation over right foot   Psych: Good eye contact, normal affect. Memory intact not anxious or depressed appearing.  CNS: CN 2-12 intact, power,  normal throughout.no focal deficits noted.   Assessment & Plan  Foot pain, right Acute on chronic right foot pain with h/o venous stasis and leg ulcer. X ray of foot, short antibiotic course and gabapentin at bedtime as needed for pain.  Needs re evaluation by vascular surgeon  Essential hypertension Controlled, no change in medication DASH diet and commitment to daily physical activity for a minimum of 30 minutes discussed and encouraged, as a part of hypertension management. The importance of attaining a healthy weight is also discussed.  BP/Weight 04/08/2017 12/17/2016 11/29/2016 10/28/2016 06/25/2016 04/02/2016 08/29/5186  Systolic BP 416 606 301 601 093 235 573  Diastolic BP 80 88 80 85 81 72 80  Wt. (Lbs) 286 285.04 288 288 - 279 271  BMI 43.49 43.34 43.79 43.79 - 42.42 41.22       Prediabetes Patient educated about the importance of limiting  Carbohydrate intake , the need to commit to daily physical activity for a minimum of 30 minutes , and to commit weight loss. The fact that changes in all these areas will reduce or eliminate all together the development of diabetes is stressed. Updated lab needed at/ before next visit.   Diabetic Labs Latest Ref Rng & Units 04/15/2017 05/15/2015 07/12/2014 07/15/2013 02/24/2013  HbA1c <5.7 % of total Hgb 5.5 - - 5.7(H) 6.0(H)  Chol 125 - 200 mg/dL - 171 - - 171  HDL >=46 mg/dL - 63 - - 60  Calc LDL <130 mg/dL - 96 - - 102(H)  Triglycerides <150 mg/dL - 62 - - 46  Creatinine 0.50 - 1.05 mg/dL 0.89 0.72 0.69 0.70 0.76   BP/Weight 04/08/2017 12/17/2016 11/29/2016 10/28/2016 06/25/2016 04/02/2016 07/22/2540  Systolic BP 706 237 628 315 127 109  559  Diastolic BP 80 88 80 85 81 72 80  Wt. (Lbs) 286 285.04 288 288 - 279 271  BMI 43.49 43.34 43.79 43.79 - 42.42 41.22   No flowsheet data found.     Venous insufficiency of both lower extremities Chronic venous stasis withe recurrent skin ulcers, needs vascular evaluation

## 2017-04-28 ENCOUNTER — Encounter: Payer: Self-pay | Admitting: Family Medicine

## 2017-04-28 ENCOUNTER — Telehealth: Payer: Self-pay | Admitting: *Deleted

## 2017-04-28 NOTE — Telephone Encounter (Signed)
Patient aware.

## 2017-04-28 NOTE — Telephone Encounter (Signed)
You had said Will discuss labs at visit (had to cancel appt) Did you want me to relay her results?

## 2017-04-28 NOTE — Telephone Encounter (Signed)
Patient canceled the cpe today due to not having insurance, patient stated she just found out that her was not in effect. Patient will call back in [place] after insurance goes into effect to reschedule cpe. Patient states she did get her blood work done and she would like to know the results. Please avise

## 2017-04-28 NOTE — Telephone Encounter (Signed)
Excellent labs except l;ow vitamoin D  Needs to stat OTC Vitamin D3 200 IU once daily

## 2017-05-08 ENCOUNTER — Telehealth: Payer: Self-pay | Admitting: Family Medicine

## 2017-05-08 ENCOUNTER — Other Ambulatory Visit: Payer: Self-pay

## 2017-05-08 MED ORDER — NAPROXEN 500 MG PO TABS: 500.0000 mg | ORAL_TABLET | Freq: Two times a day (BID) | ORAL | 0 refills | Status: DC

## 2017-05-08 NOTE — Telephone Encounter (Signed)
I recommend only  Naproxen 500 mg twice daily as needed  # 40 tablets for pain management, I will send if she will take that

## 2017-05-08 NOTE — Telephone Encounter (Signed)
Patient called in requesting a pain medication refill, she is not able to see wound care because she has no insurance until the beginning of the year. She is not sure what medication she needs refilled and is requesting to discuss the situation with Brandi. Cb#: [phone] [facility] in [place].

## 2017-05-08 NOTE — Telephone Encounter (Signed)
Patient requesting referral to vein and vascular specialist that you sent her to last time. (med sent)

## 2017-05-08 NOTE — Telephone Encounter (Signed)
Patient requesting a refill of hydrocodone because she can't see wound care until after the 1st. Please advise

## 2017-05-13 NOTE — Telephone Encounter (Signed)
rcord review only shows that she has been referred to is Dr Nils Pyle and she already has appt wit him, he is he vein Specialist who she has been seeing [place] ? Let me know

## 2017-05-14 ENCOUNTER — Telehealth: Payer: Self-pay | Admitting: Family Medicine

## 2017-05-14 ENCOUNTER — Encounter (HOSPITAL_COMMUNITY): Payer: Self-pay | Admitting: Emergency Medicine

## 2017-05-14 ENCOUNTER — Other Ambulatory Visit: Payer: Self-pay

## 2017-05-14 ENCOUNTER — Emergency Department (HOSPITAL_COMMUNITY)
Admission: EM | Admit: 2017-05-14 | Discharge: 2017-05-14 | Disposition: A | Discharge status: Home / Self Care | Payer: Self-pay | Attending: Emergency Medicine | Admitting: Emergency Medicine

## 2017-05-14 ENCOUNTER — Emergency Department (HOSPITAL_COMMUNITY): Payer: Self-pay

## 2017-05-14 ENCOUNTER — Other Ambulatory Visit: Payer: Self-pay | Admitting: Family Medicine

## 2017-05-14 DIAGNOSIS — I872 Venous insufficiency (chronic) (peripheral): Secondary | ICD-10-CM | POA: Insufficient documentation

## 2017-05-14 DIAGNOSIS — Z79899 Other long term (current) drug therapy: Secondary | ICD-10-CM | POA: Insufficient documentation

## 2017-05-14 DIAGNOSIS — I878 Other specified disorders of veins: Secondary | ICD-10-CM

## 2017-05-14 DIAGNOSIS — J302 Other seasonal allergic rhinitis: Secondary | ICD-10-CM | POA: Insufficient documentation

## 2017-05-14 DIAGNOSIS — I10 Essential (primary) hypertension: Secondary | ICD-10-CM | POA: Insufficient documentation

## 2017-05-14 MED ORDER — KETOROLAC TROMETHAMINE 60 MG/2ML IM SOLN
60.0000 mg | Freq: Once | INTRAMUSCULAR | Status: AC
  Administered 2017-05-14: 60 mg via INTRAMUSCULAR
  Filled 2017-05-14: qty 2

## 2017-05-14 MED ORDER — SULFAMETHOXAZOLE-TRIMETHOPRIM 800-160 MG PO TABS
1.0000 | ORAL_TABLET | Freq: Two times a day (BID) | ORAL | 0 refills | Status: AC
Start: 2017-05-14 — End: 2017-05-21

## 2017-05-14 MED ORDER — HYDROCODONE-ACETAMINOPHEN 5-325 MG PO TABS: 1.0000 | ORAL_TABLET | ORAL | 0 refills | Status: DC | PRN

## 2017-05-14 NOTE — Discharge Instructions (Signed)
Take the entire course of the antibiotics prescribed.  Elevate your feet as much as possible which will help with pain and any potential for swelling.  You may take the medication prescribed for pain, however this will make you drowsy, do not drive within 4 hours of taking this medication.  You have been referred for wound care with our physical therapy department.  They should call you to arrange your first appointment.  In the interim follow-up with Dr. Moshe Cipro as needed for persistent symptoms.

## 2017-05-14 NOTE — ED Provider Notes (Signed)
[facility] EMERGENCY DEPARTMENT Provider Note   CSN: [id number] Arrival date & time: 05/14/17  1251     History   Chief Complaint Chief Complaint  Patient presents with  . Wound Check    HPI Suzanne Andrews is a 51 y.o. female with a history of htn and venous insufficiency in her legs and feet with a history of varicose vein surgery years ago presenting with a recurrent wound on her right dorsal foot.  She endorses a wound for which she received wound care from a wound care center in [place] but was unable to return due to insurance issues. The site healed, but over the past month she has had recurrence of the same.  She has seen her PCP for this, was placed on gabapentin and naproxen for pain in the foot and has been using OTC medications including antibiotic ointment and topical lidocaine without improvement in symptoms.  She was seen by her PCP last month and underwent imaging and lab tests ruling out deep infection and diabetes.  There is been no drainage from the wound.  She denies fevers or chills.  Pain is worsened with weightbearing. She has mupirocin cream but has not been using this medicine.     HPI  Past Medical History:  Diagnosis Date  . Hypertension   . PVD (peripheral vascular disease) [facility])     Patient Active Problem List   Diagnosis Date Noted  . Foot pain, right 04/08/2017  . Venous insufficiency of both lower extremities 12/17/2016  . Right knee pain 08/02/2015  . Pain in joint, ankle and foot 05/15/2015  . Ovarian cyst, left 04/04/2011  . Prediabetes 04/04/2011  . MENORRHAGIA 10/15/2009  . NEVI, MULTIPLE 08/02/2009  . CAPILLARY HEMANGIOMA 08/02/2009  . Osteoarthritis, knee 08/02/2009  . ALLERGIC RHINITIS 11/21/2008  . Morbid obesity ([place]) 02/01/2008  . Essential hypertension 02/01/2008    Past Surgical History:  Procedure Laterality Date  . CESAREAN SECTION     x 2  . CHOLECYSTECTOMY    . KNEE ARTHROSCOPY WITH MEDIAL MENISECTOMY Left 07/18/2014     Procedure: LEFT KNEE ARTHROSCOPY WITH MEDIAL AND LATERAL MENISECTOMY;  Surgeon: Sanjuana Kava, MD;  Location: [facility];  Service: Orthopedics;  Laterality: Left;  . TUBAL LIGATION    . vascular surgery bilateral leg      OB History    No data available       Home Medications    Prior to Admission medications   Medication Sig Start Date End Date Taking? Authorizing Provider  bacitracin ointment Apply 1 application topically 2 (two) times daily. 11/29/16   Evalee Jefferson, PA-C  gabapentin (NEURONTIN) 100 MG capsule Take 1 capsule (100 mg total) 3 (three) times daily by mouth. 04/08/17   Fayrene Helper, MD  HYDROcodone-acetaminophen (NORCO/VICODIN) 5-325 MG tablet Take 1 tablet by mouth every 4 (four) hours as needed. 05/14/17   Evalee Jefferson, PA-C  lisinopril-hydrochlorothiazide (PRINZIDE,ZESTORETIC) 20-25 MG tablet TAKE ONE TABLET BY MOUTH ONCE DAILY 10/21/16   Fayrene Helper, MD  loratadine (CLARITIN) 10 MG tablet Take 10 mg by mouth daily as needed for allergies.     [provider]  mupirocin ointment (BACTROBAN) 2 % Place 1 application into the nose 2 (two) times daily. 12/17/16   Raylene Everts, MD  naproxen (NAPROSYN) 500 MG tablet Take 1 tablet (500 mg total) by mouth 2 (two) times daily. Take with food 05/08/17   Fayrene Helper, MD  sulfamethoxazole-trimethoprim (BACTRIM DS,SEPTRA DS) 800-160 MG  tablet Take 1 tablet by mouth 2 (two) times daily for 7 days. 05/14/17 05/21/17  Evalee Jefferson, PA-C  SYNVISC 16 MG/2ML SOSY INJECT INTO KNEE ONCE WEEKLY FOR 3 WEEKS 03/26/16   Sanjuana Kava, MD  Vitamin D, Ergocalciferol, (DRISDOL) 50000 units CAPS capsule Take 1 capsule (50,000 Units total) by mouth once a week. 12/11/15   Fayrene Helper, MD    Family History Family History  Problem Relation Age of Onset  . Hypertension Mother   . Cancer Father   . Hypertension Sister   . Cancer Sister        breast  . Cancer Brother   . Hypertension Brother   . Cancer  Brother     Social History Social History   Tobacco Use  . Smoking status: Never Smoker  . Smokeless tobacco: Never Used  Substance Use Topics  . Alcohol use: No  . Drug use: No     Allergies   Patient has no known allergies.   Review of Systems Review of Systems  Constitutional: Negative for chills and fever.  Respiratory: Negative for shortness of breath and wheezing.   Skin: Positive for color change and wound.  Neurological: Negative for numbness.     Physical Exam Updated Vital Signs BP (!) 154/76   Pulse 76   Temp 98.6 F (37 C) (Oral)   Resp 18   Ht 5\' 7"  (1.702 m)   Wt 127.5 kg (281 lb)   SpO2 98%   BMI 44.01 kg/m   Physical Exam  Constitutional: She appears well-developed and well-nourished. No distress.  HENT:  Head: Normocephalic.  Neck: Neck supple.  Cardiovascular: Normal rate.  Pulses:      Dorsalis pedis pulses are 2+ on the right side, and 2+ on the left side.  Pulmonary/Chest: Effort normal. She has no wheezes.  Musculoskeletal: Normal range of motion. She exhibits no edema.  Skin:  Venous stasis dermatitis of the right ankle.  There is no foot or ankle edema.  There is mild erythema along with 2 superficial appearing erythematous macular lesions each measuring 1-2 cm.  Wounds are dry.  There is no ulceration present.  There is no red streaking, induration or fluctuance.     ED Treatments / Results  Labs (all labs ordered are listed, but only abnormal results are displayed) Labs Reviewed - No data to display  EKG  EKG Interpretation None       Radiology Dg Foot Complete Right  Result Date: 05/14/2017 CLINICAL DATA:  Painful wound over the dorsal aspect of the foot which is now draining EXAM: RIGHT FOOT COMPLETE - 3+ VIEW COMPARISON:  Right foot films of 04/08/2017 FINDINGS: Tarsal-metatarsal alignment is normal. Joint spaces appear normal. No fracture is seen. There is some soft tissue swelling over the dorsum of the foot, but  no underlying bony abnormality is seen. IMPRESSION: Soft tissue swelling over the dorsum.  No bony abnormality. Electronically Signed   By: Ivar Drape M.D.   On: 05/14/2017 17:08    Procedures Procedures (including critical care time)  Medications Ordered in ED Medications  ketorolac (TORADOL) injection 60 mg (60 mg Intramuscular Given 05/14/17 1702)     Initial Impression / Assessment and Plan / ED Course  I have reviewed the triage vital signs and the nursing notes.  Pertinent labs & imaging results that were available during my care of the patient were reviewed by me and considered in my medical decision making (see chart for details).  Labs  recently done in patient's PCPs office were reviewed, patient is not diabetic.     Patient with chronic venous stasis dermatitis and also chronic skin breakdown secondary to this condition.  There may be a mild superficial infection, there does not appear to be a deeper wound or abscess.  She was encouraged to continue using her mupirocin ointment.  She was also placed on Bactrim.  Xeroform and bulky dressing applied.  Patient to be referred to our wound care clinic for further evaluation and treatment.  Patient was also encouraged to follow-up with her PCP as needed.    Final Clinical Impressions(s) / ED Diagnoses   Final diagnoses:  Venous stasis dermatitis of right lower extremity    ED Discharge Orders        Ordered    PT eval and treat     05/14/17 1706    sulfamethoxazole-trimethoprim (BACTRIM DS,SEPTRA DS) 800-160 MG tablet  2 times daily     05/14/17 1707    HYDROcodone-acetaminophen (NORCO/VICODIN) 5-325 MG tablet  Every 4 hours PRN     05/14/17 1707       Evalee Jefferson, PA-C 05/14/17 1716    Fredia Sorrow, MD 05/16/17 1540

## 2017-05-14 NOTE — ED Notes (Signed)
Pt taken to xray 

## 2017-05-14 NOTE — Telephone Encounter (Signed)
Please send referral to [facility] at fax [phone], Dr Nils Pyle nurse advised that the pt needs to see [facility]... Still has discomfort in right foot... Dr Nils Pyle will not see her due to insurance

## 2017-05-14 NOTE — ED Triage Notes (Signed)
Pt c/o a wound on top of right foot. Pt states she has had this wound since June and had it healed with visits from wound clinic. Wound now open and draining per pt.

## 2017-05-20 ENCOUNTER — Telehealth (HOSPITAL_COMMUNITY): Payer: Self-pay | Admitting: Physical Therapy

## 2017-05-20 ENCOUNTER — Telehealth: Payer: Self-pay | Admitting: Family Medicine

## 2017-05-20 DIAGNOSIS — L97519 Non-pressure chronic ulcer of other part of right foot with unspecified severity: Secondary | ICD-10-CM

## 2017-05-20 NOTE — Telephone Encounter (Signed)
Pt called wanted to know how soon we could get her in for wound care ulcer Rt foot. Pt will call Dr. Moshe Cipro and request referral to be sent, eval on Jan 3 available. now. NF

## 2017-05-20 NOTE — Telephone Encounter (Signed)
Patient is requesting a referral to wound care in [place] asap. She is also requesting brandito call her.  [phone]

## 2017-05-20 NOTE — Telephone Encounter (Signed)
Dr entered referral

## 2017-05-21 NOTE — Telephone Encounter (Signed)
I called patient and left message.

## 2017-05-25 NOTE — Telephone Encounter (Signed)
This referral was sent in , please follow up, thanks

## 2017-06-05 NOTE — Telephone Encounter (Signed)
Entered. Had appt in jan

## 2017-06-11 DIAGNOSIS — I8312 Varicose veins of left lower extremity with inflammation: Secondary | ICD-10-CM | POA: Diagnosis not present

## 2017-06-11 DIAGNOSIS — I83203 Varicose veins of unspecified lower extremity with both ulcer of ankle and inflammation: Secondary | ICD-10-CM | POA: Diagnosis not present

## 2017-06-11 DIAGNOSIS — I8311 Varicose veins of right lower extremity with inflammation: Secondary | ICD-10-CM | POA: Diagnosis not present

## 2017-06-11 DIAGNOSIS — I83893 Varicose veins of bilateral lower extremities with other complications: Secondary | ICD-10-CM | POA: Diagnosis not present

## 2017-06-15 DIAGNOSIS — I8311 Varicose veins of right lower extremity with inflammation: Secondary | ICD-10-CM | POA: Diagnosis not present

## 2017-06-15 DIAGNOSIS — L97221 Non-pressure chronic ulcer of left calf limited to breakdown of skin: Secondary | ICD-10-CM | POA: Diagnosis not present

## 2017-06-15 DIAGNOSIS — I83203 Varicose veins of unspecified lower extremity with both ulcer of ankle and inflammation: Secondary | ICD-10-CM | POA: Diagnosis not present

## 2017-06-24 DIAGNOSIS — L97221 Non-pressure chronic ulcer of left calf limited to breakdown of skin: Secondary | ICD-10-CM | POA: Diagnosis not present

## 2017-06-24 DIAGNOSIS — I83203 Varicose veins of unspecified lower extremity with both ulcer of ankle and inflammation: Secondary | ICD-10-CM | POA: Diagnosis not present

## 2017-06-24 DIAGNOSIS — I8312 Varicose veins of left lower extremity with inflammation: Secondary | ICD-10-CM | POA: Diagnosis not present

## 2017-06-24 DIAGNOSIS — I8311 Varicose veins of right lower extremity with inflammation: Secondary | ICD-10-CM | POA: Diagnosis not present

## 2017-07-01 DIAGNOSIS — I8311 Varicose veins of right lower extremity with inflammation: Secondary | ICD-10-CM | POA: Diagnosis not present

## 2017-07-01 DIAGNOSIS — I83203 Varicose veins of unspecified lower extremity with both ulcer of ankle and inflammation: Secondary | ICD-10-CM | POA: Diagnosis not present

## 2017-07-01 DIAGNOSIS — L97211 Non-pressure chronic ulcer of right calf limited to breakdown of skin: Secondary | ICD-10-CM | POA: Diagnosis not present

## 2017-07-03 DIAGNOSIS — I8311 Varicose veins of right lower extremity with inflammation: Secondary | ICD-10-CM | POA: Diagnosis not present

## 2017-07-27 ENCOUNTER — Other Ambulatory Visit: Payer: Self-pay | Admitting: Family Medicine

## 2017-07-29 DIAGNOSIS — I83203 Varicose veins of unspecified lower extremity with both ulcer of ankle and inflammation: Secondary | ICD-10-CM | POA: Diagnosis not present

## 2017-07-29 DIAGNOSIS — I8311 Varicose veins of right lower extremity with inflammation: Secondary | ICD-10-CM | POA: Diagnosis not present

## 2017-07-31 DIAGNOSIS — I8311 Varicose veins of right lower extremity with inflammation: Secondary | ICD-10-CM | POA: Diagnosis not present

## 2017-08-24 DIAGNOSIS — L97211 Non-pressure chronic ulcer of right calf limited to breakdown of skin: Secondary | ICD-10-CM | POA: Diagnosis not present

## 2017-08-24 DIAGNOSIS — I8311 Varicose veins of right lower extremity with inflammation: Secondary | ICD-10-CM | POA: Diagnosis not present

## 2017-08-24 DIAGNOSIS — I83203 Varicose veins of unspecified lower extremity with both ulcer of ankle and inflammation: Secondary | ICD-10-CM | POA: Diagnosis not present

## 2017-09-29 DIAGNOSIS — I8311 Varicose veins of right lower extremity with inflammation: Secondary | ICD-10-CM | POA: Diagnosis not present

## 2017-09-29 DIAGNOSIS — L97211 Non-pressure chronic ulcer of right calf limited to breakdown of skin: Secondary | ICD-10-CM | POA: Diagnosis not present

## 2017-09-29 DIAGNOSIS — I83203 Varicose veins of unspecified lower extremity with both ulcer of ankle and inflammation: Secondary | ICD-10-CM | POA: Diagnosis not present

## 2017-10-12 DIAGNOSIS — I8311 Varicose veins of right lower extremity with inflammation: Secondary | ICD-10-CM | POA: Diagnosis not present

## 2017-10-28 DIAGNOSIS — L97211 Non-pressure chronic ulcer of right calf limited to breakdown of skin: Secondary | ICD-10-CM | POA: Diagnosis not present

## 2017-10-28 DIAGNOSIS — I8311 Varicose veins of right lower extremity with inflammation: Secondary | ICD-10-CM | POA: Diagnosis not present

## 2017-10-28 DIAGNOSIS — I83203 Varicose veins of unspecified lower extremity with both ulcer of ankle and inflammation: Secondary | ICD-10-CM | POA: Diagnosis not present

## 2017-11-25 DIAGNOSIS — I83203 Varicose veins of unspecified lower extremity with both ulcer of ankle and inflammation: Secondary | ICD-10-CM | POA: Diagnosis not present

## 2017-11-25 DIAGNOSIS — L97221 Non-pressure chronic ulcer of left calf limited to breakdown of skin: Secondary | ICD-10-CM | POA: Diagnosis not present

## 2017-11-26 DIAGNOSIS — I8311 Varicose veins of right lower extremity with inflammation: Secondary | ICD-10-CM | POA: Diagnosis not present

## 2017-11-26 DIAGNOSIS — L97211 Non-pressure chronic ulcer of right calf limited to breakdown of skin: Secondary | ICD-10-CM | POA: Diagnosis not present

## 2017-11-26 DIAGNOSIS — I83203 Varicose veins of unspecified lower extremity with both ulcer of ankle and inflammation: Secondary | ICD-10-CM | POA: Diagnosis not present

## 2017-12-02 DIAGNOSIS — L97211 Non-pressure chronic ulcer of right calf limited to breakdown of skin: Secondary | ICD-10-CM | POA: Diagnosis not present

## 2017-12-02 DIAGNOSIS — I83203 Varicose veins of unspecified lower extremity with both ulcer of ankle and inflammation: Secondary | ICD-10-CM | POA: Diagnosis not present

## 2017-12-14 DIAGNOSIS — L97221 Non-pressure chronic ulcer of left calf limited to breakdown of skin: Secondary | ICD-10-CM | POA: Diagnosis not present

## 2017-12-21 DIAGNOSIS — I83203 Varicose veins of unspecified lower extremity with both ulcer of ankle and inflammation: Secondary | ICD-10-CM | POA: Diagnosis not present

## 2017-12-21 DIAGNOSIS — I8311 Varicose veins of right lower extremity with inflammation: Secondary | ICD-10-CM | POA: Diagnosis not present

## 2017-12-21 DIAGNOSIS — I83811 Varicose veins of right lower extremities with pain: Secondary | ICD-10-CM | POA: Diagnosis not present

## 2017-12-21 DIAGNOSIS — I83891 Varicose veins of right lower extremities with other complications: Secondary | ICD-10-CM | POA: Diagnosis not present

## 2018-01-13 DIAGNOSIS — M170 Bilateral primary osteoarthritis of knee: Secondary | ICD-10-CM | POA: Diagnosis not present

## 2018-01-13 DIAGNOSIS — M25461 Effusion, right knee: Secondary | ICD-10-CM | POA: Diagnosis not present

## 2018-01-13 DIAGNOSIS — M25462 Effusion, left knee: Secondary | ICD-10-CM | POA: Diagnosis not present

## 2018-01-13 DIAGNOSIS — M1712 Unilateral primary osteoarthritis, left knee: Secondary | ICD-10-CM | POA: Diagnosis not present

## 2018-01-13 DIAGNOSIS — M25561 Pain in right knee: Secondary | ICD-10-CM | POA: Diagnosis not present

## 2018-01-13 DIAGNOSIS — M25562 Pain in left knee: Secondary | ICD-10-CM | POA: Diagnosis not present

## 2018-01-27 DIAGNOSIS — M25562 Pain in left knee: Secondary | ICD-10-CM | POA: Diagnosis not present

## 2018-01-27 DIAGNOSIS — M1712 Unilateral primary osteoarthritis, left knee: Secondary | ICD-10-CM | POA: Diagnosis not present

## 2018-02-03 DIAGNOSIS — M1712 Unilateral primary osteoarthritis, left knee: Secondary | ICD-10-CM | POA: Diagnosis not present

## 2018-02-03 DIAGNOSIS — M25562 Pain in left knee: Secondary | ICD-10-CM | POA: Diagnosis not present

## 2018-02-08 DIAGNOSIS — M25562 Pain in left knee: Secondary | ICD-10-CM | POA: Diagnosis not present

## 2018-02-08 DIAGNOSIS — M1712 Unilateral primary osteoarthritis, left knee: Secondary | ICD-10-CM | POA: Diagnosis not present

## 2018-02-15 DIAGNOSIS — M1712 Unilateral primary osteoarthritis, left knee: Secondary | ICD-10-CM | POA: Diagnosis not present

## 2018-02-15 DIAGNOSIS — M25562 Pain in left knee: Secondary | ICD-10-CM | POA: Diagnosis not present

## 2018-04-07 DIAGNOSIS — Z23 Encounter for immunization: Secondary | ICD-10-CM | POA: Diagnosis not present

## 2018-05-27 ENCOUNTER — Other Ambulatory Visit: Payer: Self-pay | Admitting: Family Medicine

## 2018-07-06 ENCOUNTER — Telehealth: Payer: Self-pay | Admitting: Orthopaedic Surgery

## 2018-07-06 NOTE — Telephone Encounter (Signed)
Patient called to inquire about returning for appointment with Dr Luna Glasgow for knee - offered. Patient then mentioned she has been to [facility] in 2019 "for one injection" since her last visit here in 2018. Relayed that notes will be needed for the appointment. The appointment is pending records, which patient voiced understanding to request from this provider.

## 2018-07-19 ENCOUNTER — Telehealth: Payer: Self-pay | Admitting: Orthopaedic Surgery

## 2018-07-19 NOTE — Telephone Encounter (Addendum)
Patient called asking if we had received notes from [facility] on her. I told her I didn't see any notes on her as of yet. She was wanting to schedule an appointment with Dr. Luna Glasgow. I told her we would be glad to schedule her, but would be better if the doctor had those notes ahead of the appointment. I asked her to give time for the notes to come in by fax. Stated she would give [place] a call back in a day or two.  07/20/18 We received her notes and they will need to be reviewed before scheduling. Patient called and was told this.

## 2018-08-03 ENCOUNTER — Other Ambulatory Visit: Payer: Self-pay

## 2018-08-03 ENCOUNTER — Ambulatory Visit (INDEPENDENT_AMBULATORY_CARE_PROVIDER_SITE_OTHER): Payer: Medicare Other | Admitting: Orthopaedic Surgery

## 2018-08-03 ENCOUNTER — Encounter: Payer: Self-pay | Admitting: Orthopaedic Surgery

## 2018-08-03 VITALS — BP 159/91 | HR 97 | Ht 67.0 in | Wt 275.0 lb

## 2018-08-03 DIAGNOSIS — M25561 Pain in right knee: Secondary | ICD-10-CM

## 2018-08-03 DIAGNOSIS — G8929 Other chronic pain: Secondary | ICD-10-CM | POA: Diagnosis not present

## 2018-08-03 DIAGNOSIS — M25562 Pain in left knee: Secondary | ICD-10-CM | POA: Diagnosis not present

## 2018-08-03 NOTE — Progress Notes (Signed)
Patient Suzanne Andrews, female DOB:1965-10-28, 53 y.o. HCW:[id number]  Chief Complaint  Patient presents with  . Knee Pain    bilateral, went to [facility] very little relief     HPI  Suzanne Andrews is a 53 y.o. female who has left and right knee pain, more on the left.  I had given her Synvisc in the past for the left knee and it did well.  In late August of 2019 she went to [facility] in [place] and had five weekly injections of Hyalgan into the left knee.  She says she did not have any results.  She feels the same.  She want me to give Synvisc to the left knee.  I have copies of her notes from [facility].  I have reviewed them.  I have told her she must wait at least six months before she can have new visco supplementation to the left knee.  I will see her in a month and inject both knees.  She would prefer the Synvisc three injection method.   Body mass index is 43.07 kg/m.  The patient meets the AMA guidelines for Morbid (severe) obesity with a BMI > 40.0 and I have recommended weight loss.   ROS  Review of Systems  Constitutional: Positive for activity change.  HENT: Negative for congestion.   Respiratory: Negative for cough and shortness of breath.   Cardiovascular: Negative for chest pain and leg swelling.  Endocrine: Positive for cold intolerance.  Musculoskeletal: Positive for arthralgias, gait problem and joint swelling.  Allergic/Immunologic: Positive for environmental allergies.  All other systems reviewed and are negative.   All other systems reviewed and are negative.  The following is a summary of the past history medically, past history surgically, known current medicines, social history and family history.  This information is gathered electronically by the computer from prior information and documentation.  I review this each visit and have found including this information at this point in the chart is beneficial and informative.    Past  Medical History:  Diagnosis Date  . Hypertension   . PVD (peripheral vascular disease) ([place])     Past Surgical History:  Procedure Laterality Date  . CESAREAN SECTION     x 2  . CHOLECYSTECTOMY    . KNEE ARTHROSCOPY WITH MEDIAL MENISECTOMY Left 07/18/2014   Procedure: LEFT KNEE ARTHROSCOPY WITH MEDIAL AND LATERAL MENISECTOMY;  Surgeon: Sanjuana Kava, MD;  Location: [facility];  Service: Orthopedics;  Laterality: Left;  . TUBAL LIGATION    . vascular surgery bilateral leg      Family History  Problem Relation Age of Onset  . Hypertension Mother   . Cancer Father   . Hypertension Sister   . Cancer Sister        breast  . Cancer Brother   . Hypertension Brother   . Cancer Brother     Social History Social History   Tobacco Use  . Smoking status: Never Smoker  . Smokeless tobacco: Never Used  Substance Use Topics  . Alcohol use: No  . Drug use: No    No Known Allergies  Current Outpatient Medications  Medication Sig Dispense Refill  . lisinopril-hydrochlorothiazide (PRINZIDE,ZESTORETIC) 20-25 MG tablet TAKE 1 TABLET BY MOUTH ONCE DAILY 15 tablet 0  . loratadine (CLARITIN) 10 MG tablet Take 10 mg by mouth daily as needed for allergies.     . Vitamin D, Ergocalciferol, (DRISDOL) 50000 units CAPS capsule Take 1 capsule (50,000 Units total) by mouth  once a week. (Patient not taking: Reported on 08/03/2018) 12 capsule 0   No current facility-administered medications for this visit.      Physical Exam  Blood pressure (!) 159/91, pulse 97, height 5\' 7"  (1.702 m), weight 275 lb (124.7 kg).  Constitutional: overall normal hygiene, normal nutrition, well developed, normal grooming, normal body habitus. Assistive device:none  Musculoskeletal: gait and station Limp left, muscle tone and strength are normal, no tremors or atrophy is present.  .  Neurological: coordination overall normal.  Deep tendon reflex/nerve stretch intact.  Sensation normal.  Cranial nerves II-XII intact.    Skin:   Normal overall no scars, lesions, ulcers or rashes. No psoriasis.  Psychiatric: Alert and oriented x 3.  Recent memory intact, remote memory unclear.  Normal mood and affect. Well groomed.  Good eye contact.  Cardiovascular: overall no swelling, no varicosities, no edema bilaterally, normal temperatures of the legs and arms, no clubbing, cyanosis and good capillary refill.  Lymphatic: palpation is normal.  Both knees are tender, the left more than the right but the right has more of an effusion.  ROM left 0 to 105 and right 0 to 110.  Limp left.  NV intact.  She has no distal edema.  She is more tender medially.  All other systems reviewed and are negative   The patient has been educated about the nature of the problem(s) and counseled on treatment options.  The patient appeared to understand what I have discussed and is in agreement with it.  Encounter Diagnoses  Name Primary?  . Chronic pain of left knee Yes  . Chronic pain of right knee    PROCEDURE NOTE:  The patient requests injections of the left knee , verbal consent was obtained.  The left knee was prepped appropriately after time out was performed.   Sterile technique was observed and injection of 1 cc of Depo-Medrol 40 mg with several cc's of plain xylocaine. Anesthesia was provided by ethyl chloride and a 20-gauge needle was used to inject the knee area. The injection was tolerated well.  A band aid dressing was applied.  The patient was advised to apply ice later today and tomorrow to the injection sight as needed.  PROCEDURE NOTE:  The patient requests injections of the right knee , verbal consent was obtained.  The right knee was prepped appropriately after time out was performed.   Sterile technique was observed and injection of 1 cc of Depo-Medrol 40 mg with several cc's of plain xylocaine. Anesthesia was provided by ethyl chloride and a 20-gauge needle was used to inject the knee area. The injection was  tolerated well.  A band aid dressing was applied.  The patient was advised to apply ice later today and tomorrow to the injection sight as needed.   PLAN Call if any problems.  Precautions discussed.  Continue current medications.   Return to clinic 1 month   Do Synvisc injections on return, one weekly for three weeks.  Electronically Signed Sanjuana Kava, MD 3/3/20204:05 PM

## 2018-08-09 ENCOUNTER — Telehealth: Payer: Self-pay

## 2018-08-09 NOTE — Telephone Encounter (Signed)
Pt would like to get Synvisc 3 in both knees. Insurance will pay 80% and pt will have 20% OOP to pay. Soke with pt regarding her OOP costs and she would like to proceed with getting bilateral injections. Orders will be placed.

## 2018-08-25 ENCOUNTER — Telehealth: Payer: Self-pay | Admitting: Radiology

## 2018-08-25 NOTE — Telephone Encounter (Signed)
I called patient to RS her synvisc injections she is scheduled for 08/31/18. No answer at home no answer cell  Left message cell for her to call back to RS to after May 3rd

## 2018-08-25 NOTE — Telephone Encounter (Signed)
Ms Alcaide called and I have rescheduled her for injection on Tuesdat, May 5th at 1:30

## 2018-08-27 ENCOUNTER — Telehealth: Payer: Self-pay | Admitting: Orthopedic Surgery

## 2018-08-31 ENCOUNTER — Ambulatory Visit: Payer: Medicare Other | Admitting: Orthopaedic Surgery

## 2018-08-31 ENCOUNTER — Telehealth: Payer: Self-pay | Admitting: Orthopaedic Surgery

## 2018-08-31 MED ORDER — NAPROXEN 500 MG PO TABS: 500.0000 mg | ORAL_TABLET | Freq: Two times a day (BID) | ORAL | 5 refills | Status: DC

## 2018-08-31 NOTE — Telephone Encounter (Signed)
Patient is asking if you would send in a prescription for Naproxen 500 mg.  PATIENT USES [facility]

## 2018-09-13 ENCOUNTER — Other Ambulatory Visit: Payer: Self-pay | Admitting: Family Medicine

## 2018-10-05 ENCOUNTER — Ambulatory Visit: Payer: Self-pay | Admitting: Orthopaedic Surgery

## 2018-10-07 ENCOUNTER — Other Ambulatory Visit: Payer: Self-pay

## 2018-10-07 ENCOUNTER — Ambulatory Visit (INDEPENDENT_AMBULATORY_CARE_PROVIDER_SITE_OTHER): Payer: Medicare Other | Admitting: Orthopaedic Surgery

## 2018-10-07 ENCOUNTER — Encounter: Payer: Self-pay | Admitting: Orthopaedic Surgery

## 2018-10-07 VITALS — Temp 96.8°F

## 2018-10-07 DIAGNOSIS — M25561 Pain in right knee: Secondary | ICD-10-CM

## 2018-10-07 DIAGNOSIS — M25562 Pain in left knee: Secondary | ICD-10-CM | POA: Diagnosis not present

## 2018-10-07 DIAGNOSIS — G8929 Other chronic pain: Secondary | ICD-10-CM

## 2018-10-07 NOTE — Progress Notes (Signed)
PROCEDURE NOTE:  Synvisc injection #  1 of 3   Injection 1 vial of Synvisc into right  knee  Temp (!) 96.8 F (36 C)   The right knee exam: there was no synovitis or infection   The knee was prepped sterilely  Ethyl chloride was used to anesthetize the skin A 20 g needle was used to inject the knee with 1 vial of Synvisc A sterile dressing was placed  There were no complications  Encounter Diagnoses  Name Primary?  . Chronic pain of left knee Yes  . Chronic pain of right knee     Follow up one week  PROCEDURE NOTE:  Synvisc injection #  1 of 3   Injection 1 vial of Synvisc into left  knee  Temp (!) 96.8 F (36 C)   The left knee exam: there was no synovitis or infection   The knee was prepped sterilely  Ethyl chloride was used to anesthetize the skin A 20 g needle was used to inject the knee with 1 vial of Synvisc A sterile dressing was placed  There were no complications  Encounter Diagnoses  Name Primary?  . Chronic pain of left knee Yes  . Chronic pain of right knee     Follow up one week  Electronically Signed Sanjuana Kava, MD 5/7/202010:18 AM

## 2018-10-14 ENCOUNTER — Ambulatory Visit (INDEPENDENT_AMBULATORY_CARE_PROVIDER_SITE_OTHER): Payer: Medicare Other | Admitting: Orthopaedic Surgery

## 2018-10-14 ENCOUNTER — Encounter: Payer: Self-pay | Admitting: Orthopaedic Surgery

## 2018-10-14 ENCOUNTER — Other Ambulatory Visit: Payer: Self-pay

## 2018-10-14 VITALS — Temp 97.0°F

## 2018-10-14 DIAGNOSIS — M25561 Pain in right knee: Secondary | ICD-10-CM | POA: Diagnosis not present

## 2018-10-14 DIAGNOSIS — G8929 Other chronic pain: Secondary | ICD-10-CM

## 2018-10-14 DIAGNOSIS — M25562 Pain in left knee: Secondary | ICD-10-CM | POA: Diagnosis not present

## 2018-10-14 NOTE — Progress Notes (Signed)
PROCEDURE NOTE:  Synvisc injection #  2 of 3   Injection 1 vial of Synvisc into right  knee  Temp (!) 97 F (36.1 C)   The right knee exam: there was no synovitis or infection   The knee was prepped sterilely  Ethyl chloride was used to anesthetize the skin A 20 g needle was used to inject the knee with 1 vial of Synvisc A sterile dressing was placed  There were no complications  Encounter Diagnoses  Name Primary?  . Chronic pain of left knee Yes  . Chronic pain of right knee     Follow up one week  PROCEDURE NOTE:  Synvisc injection #  2 of 3   Injection 1 vial of Synvisc into left  knee  Temp (!) 97 F (36.1 C)   The left knee exam: there was no synovitis or infection   The knee was prepped sterilely  Ethyl chloride was used to anesthetize the skin A 20 g needle was used to inject the knee with 1 vial of Synvisc A sterile dressing was placed  There were no complications  Encounter Diagnoses  Name Primary?  . Chronic pain of left knee Yes  . Chronic pain of right knee     Follow up one week  Electronically Signed Sanjuana Kava, MD 5/14/202010:08 AM

## 2018-10-21 ENCOUNTER — Other Ambulatory Visit: Payer: Self-pay

## 2018-10-21 ENCOUNTER — Ambulatory Visit (INDEPENDENT_AMBULATORY_CARE_PROVIDER_SITE_OTHER): Payer: Medicare Other | Admitting: Orthopaedic Surgery

## 2018-10-21 ENCOUNTER — Encounter: Payer: Self-pay | Admitting: Orthopaedic Surgery

## 2018-10-21 VITALS — Temp 97.2°F

## 2018-10-21 DIAGNOSIS — M25561 Pain in right knee: Secondary | ICD-10-CM | POA: Diagnosis not present

## 2018-10-21 DIAGNOSIS — G8929 Other chronic pain: Secondary | ICD-10-CM

## 2018-10-21 DIAGNOSIS — M25562 Pain in left knee: Secondary | ICD-10-CM

## 2018-10-21 NOTE — Progress Notes (Signed)
This patient is diagnosed with osteoarthritis of the knee(s).    Radiographs show evidence of joint space narrowing, osteophytes, subchondral sclerosis and/or subchondral cysts.  This patient has knee pain which interferes with functional and activities of daily living.    This patient has experienced inadequate response, adverse effects and/or intolerance with conservative treatments such as acetaminophen, NSAIDS, topical creams, physical therapy or regular exercise, knee bracing and/or weight loss.   This patient has experienced inadequate response or has a contraindication to intra articular steroid injections for at least 3 months.   This patient is not scheduled to have a total knee replacement within 6 months of starting treatment with viscosupplementation.  PROCEDURE NOTE:  Synvisc injection #  3 of 3   Injection 1 vial of Synvisc into right  knee  There were no vitals taken for this visit.  The right knee exam: there was no synovitis or infection   The knee was prepped sterilely  Ethyl chloride was used to anesthetize the skin A 20 g needle was used to inject the knee with 1 vial of Synvisc A sterile dressing was placed  There were no complications  Encounter Diagnoses  Name Primary?  . Chronic pain of left knee Yes  . Chronic pain of right knee     Follow up one month  PROCEDURE NOTE:  Synvisc injection #  3 of 3   Injection 1 vial of Synvisc into left  knee  Temp (!) 97.2 F (36.2 C)   The left knee exam: there was no synovitis or infection   The knee was prepped sterilely  Ethyl chloride was used to anesthetize the skin A 20 g needle was used to inject the knee with 1 vial of Synvisc A sterile dressing was placed  There were no complications  Encounter Diagnoses  Name Primary?  . Chronic pain of left knee Yes  . Chronic pain of right knee     Follow up one month  Electronically Signed Sanjuana Kava, MD 5/21/202010:06 AM

## 2018-11-23 ENCOUNTER — Encounter: Payer: Self-pay | Admitting: Orthopaedic Surgery

## 2018-11-23 ENCOUNTER — Other Ambulatory Visit: Payer: Self-pay

## 2018-11-23 ENCOUNTER — Ambulatory Visit (INDEPENDENT_AMBULATORY_CARE_PROVIDER_SITE_OTHER): Payer: Medicare Other | Admitting: Orthopaedic Surgery

## 2018-11-23 VITALS — BP 173/95 | HR 84 | Temp 97.5°F | Ht 67.0 in | Wt 280.0 lb

## 2018-11-23 DIAGNOSIS — G8929 Other chronic pain: Secondary | ICD-10-CM

## 2018-11-23 DIAGNOSIS — M25561 Pain in right knee: Secondary | ICD-10-CM

## 2018-11-23 DIAGNOSIS — M25562 Pain in left knee: Secondary | ICD-10-CM | POA: Diagnosis not present

## 2018-11-23 MED ORDER — PREDNISONE 5 MG (21) PO TBPK: ORAL_TABLET | ORAL | 0 refills | Status: DC

## 2018-11-23 NOTE — Progress Notes (Signed)
Patient Suzanne Andrews, female DOB:[birth year], 53 y.o. CZY:[id number]  Chief Complaint  Patient presents with  . Knee Pain    L sore/R ok    HPI  Suzanne Andrews is a 53 y.o. female who has pain more in the left knee now after the injections of Synvisc. She has pain from the knee to the hip and lower back. I told her it could be coming from her back. She has no increased swelling, no redness, no giving way.  I will call in prednisone dose pack.  I will see her in three weeks.   Body mass index is 43.85 kg/m.  ROS  Review of Systems  Constitutional: Positive for activity change.  HENT: Negative for congestion.   Respiratory: Negative for cough and shortness of breath.   Cardiovascular: Negative for chest pain and leg swelling.  Endocrine: Positive for cold intolerance.  Musculoskeletal: Positive for arthralgias, gait problem and joint swelling.  Allergic/Immunologic: Positive for environmental allergies.  All other systems reviewed and are negative.   All other systems reviewed and are negative.  The following is a summary of the past history medically, past history surgically, known current medicines, social history and family history.  This information is gathered electronically by the computer from prior information and documentation.  I review this each visit and have found including this information at this point in the chart is beneficial and informative.    Past Medical History:  Diagnosis Date  . Hypertension   . PVD (peripheral vascular disease) ([place])     Past Surgical History:  Procedure Laterality Date  . CESAREAN SECTION     x 2  . CHOLECYSTECTOMY    . KNEE ARTHROSCOPY WITH MEDIAL MENISECTOMY Left 07/18/2014   Procedure: LEFT KNEE ARTHROSCOPY WITH MEDIAL AND LATERAL MENISECTOMY;  Surgeon: Sanjuana Kava, MD;  Location: [facility];  Service: Orthopedics;  Laterality: Left;  . TUBAL LIGATION    . vascular surgery bilateral leg      Family History  Problem  Relation Age of Onset  . Hypertension Mother   . Cancer Father   . Hypertension Sister   . Cancer Sister        breast  . Cancer Brother   . Hypertension Brother   . Cancer Brother     Social History Social History   Tobacco Use  . Smoking status: Never Smoker  . Smokeless tobacco: Never Used  Substance Use Topics  . Alcohol use: No  . Drug use: No    No Known Allergies  Current Outpatient Medications  Medication Sig Dispense Refill  . lisinopril-hydrochlorothiazide (PRINZIDE,ZESTORETIC) 20-25 MG tablet TAKE 1 TABLET BY MOUTH ONCE DAILY 15 tablet 0  . loratadine (CLARITIN) 10 MG tablet Take 10 mg by mouth daily as needed for allergies.     . naproxen (NAPROSYN) 500 MG tablet Take 1 tablet (500 mg total) by mouth 2 (two) times daily with a meal. 60 tablet 5  . predniSONE (STERAPRED UNI-PAK 21 TAB) 5 MG (21) TBPK tablet Take 6 pills first day; 5 pills second day; 4 pills third day; 3 pills fourth day; 2 pills next day and 1 pill last day. 21 tablet 0  . Vitamin D, Ergocalciferol, (DRISDOL) 50000 units CAPS capsule Take 1 capsule (50,000 Units total) by mouth once a week. (Patient not taking: Reported on 08/03/2018) 12 capsule 0   No current facility-administered medications for this visit.      Physical Exam  Blood pressure (!) 173/95, pulse 84,  temperature (!) 97.5 F (36.4 C), height 5\' 7"  (1.702 m), weight 280 lb (127 kg).  Constitutional: overall normal hygiene, normal nutrition, well developed, normal grooming, normal body habitus. Assistive device:none  Musculoskeletal: gait and station Limp none, muscle tone and strength are normal, no tremors or atrophy is present.  .  Neurological: coordination overall normal.  Deep tendon reflex/nerve stretch intact.  Sensation normal.  Cranial nerves II-XII intact.   Skin:   Normal overall no scars, lesions, ulcers or rashes. No psoriasis.  Psychiatric: Alert and oriented x 3.  Recent memory intact, remote memory unclear.   Normal mood and affect. Well groomed.  Good eye contact.  Cardiovascular: overall no swelling, no varicosities, no edema bilaterally, normal temperatures of the legs and arms, no clubbing, cyanosis and good capillary refill.  Lymphatic: palpation is normal.  She has no limp today. Both knees have just slight effusion. ROM of right is 0 to 120, left 0 to 110.  NV intact.  All other systems reviewed and are negative   The patient has been educated about the nature of the problem(s) and counseled on treatment options.  The patient appeared to understand what I have discussed and is in agreement with it.  Encounter Diagnoses  Name Primary?  . Chronic pain of left knee Yes  . Chronic pain of right knee     PLAN Call if any problems.  Precautions discussed.  Continue current medications.   Return to clinic 3 weeks   Electronically Signed Sanjuana Kava, MD 6/23/20209:52 AM

## 2018-11-29 ENCOUNTER — Ambulatory Visit (INDEPENDENT_AMBULATORY_CARE_PROVIDER_SITE_OTHER): Payer: Medicare Other | Admitting: Family Medicine

## 2018-11-29 ENCOUNTER — Other Ambulatory Visit: Payer: Self-pay

## 2018-11-29 ENCOUNTER — Encounter: Payer: Self-pay | Admitting: Family Medicine

## 2018-11-29 VITALS — BP 138/82 | HR 96 | Resp 12 | Ht 67.0 in | Wt 284.0 lb

## 2018-11-29 DIAGNOSIS — R7301 Impaired fasting glucose: Secondary | ICD-10-CM | POA: Diagnosis not present

## 2018-11-29 DIAGNOSIS — E559 Vitamin D deficiency, unspecified: Secondary | ICD-10-CM | POA: Diagnosis not present

## 2018-11-29 DIAGNOSIS — I10 Essential (primary) hypertension: Secondary | ICD-10-CM

## 2018-11-29 DIAGNOSIS — M1712 Unilateral primary osteoarthritis, left knee: Secondary | ICD-10-CM

## 2018-11-29 DIAGNOSIS — Z1231 Encounter for screening mammogram for malignant neoplasm of breast: Secondary | ICD-10-CM

## 2018-11-29 DIAGNOSIS — R7303 Prediabetes: Secondary | ICD-10-CM

## 2018-11-29 MED ORDER — PHENTERMINE HCL 37.5 MG PO TABS: ORAL_TABLET | ORAL | 1 refills | Status: DC

## 2018-11-29 NOTE — Assessment & Plan Note (Signed)
Good response to recent oral steroids, weight loss wrongly encouraged

## 2018-11-29 NOTE — Progress Notes (Signed)
Suzanne Andrews     MRN: [id number]      DOB: 30-Jul-1965   HPI Suzanne Andrews is here for follow up and re-evaluation of chronic medical conditions, medication management and will update labs in the near  future for review. Preventive health is updated, specifically  Cancer screening and Immunization.   States she had to have 7 procedures on right lower extremity for vascular problems in 2019, now no longer has pain or ulceration of right leg. C/o disabling left knee pain, recent good response  To oral steroids, simvisc has been of some  Benefit C/o stress from her personal health and Mother recently placed in nursing home and sustained a fracture. Will take her time in getting cancer screening addresed. Requests phentermine which she has used successfully in the past to help with weight loss Father died of colon ca in his 18's , wants to defer getting her colonoscopy done at this time ROS Denies recent fever or chills. Denies sinus pressure, nasal congestion, ear pain or sore throat. Denies chest congestion, productive cough or wheezing. Denies chest pains, palpitations and leg swelling Denies abdominal pain, nausea, vomiting,diarrhea or constipation.   Denies dysuria, frequency, hesitancy or incontinence.  Denies headaches, seizures, numbness, or tingling. Denies depression, anxiety or insomnia. Denies skin break down or rash.   PE  BP 138/82   Pulse 96   Resp 12   Ht 5\' 7"  (1.702 m)   Wt 284 lb (128.8 kg)   SpO2 100%   BMI 44.48 kg/m   Patient alert and oriented and in no cardiopulmonary distress.  HEENT: No facial asymmetry, EOMI,   oropharynx pink and moist.  Neck supple no JVD, no mass.  Chest: Clear to auscultation bilaterally.  CVS: S1, S2 no murmurs, no S3.Regular rate.  ABD: Soft non tender.   Ext: No edema  MS: Adequate ROM spine, shoulders, hips and reduced in left  knee.  Skin: Intact, hyperpigmented healed ul;cer on anterior aspect right leg  Psych:  Good eye contact, normal affect. Memory intact not anxious or depressed appearing.  CNS: CN 2-12 intact, power,  normal throughout.no focal deficits noted.   Assessment & Plan  Essential hypertension Controlled, no change in medication DASH diet and commitment to daily physical activity for a minimum of 30 minutes discussed and encouraged, as a part of hypertension management. The importance of attaining a healthy weight is also discussed.  BP/Weight 11/29/2018 11/23/2018 08/03/2018 05/14/2017 04/08/2017 12/17/2016 2/70/6237  Systolic BP 628 315 176 160 737 106 269  Diastolic BP 82 95 91 76 80 88 80  Wt. (Lbs) 284 280 275 281 286 285.04 288  BMI 44.48 43.85 43.07 44.01 43.49 43.34 43.79       Morbid obesity ([facility])  Patient re-educated about  the importance of commitment to a  minimum of 150 minutes of exercise per week as able.  The importance of healthy food choices with portion control discussed, as well as eating regularly and within a 12 hour window most days. The need to choose "clean , green" food 50 to 75% of the time is discussed, as well as to make water the primary drink and set a goal of 64 ounces water daily.    Weight /BMI 11/29/2018 11/23/2018 08/03/2018  WEIGHT 284 lb 280 lb 275 lb  HEIGHT 5\' 7"  5\' 7"  5\' 7"   BMI 44.48 kg/m2 43.85 kg/m2 43.07 kg/m2  Start half phentermine daily, weight loss goal of 4 pounds/ month    Osteoarthritis,  knee Good response to recent oral steroids, weight loss wrongly encouraged  Prediabetes Patient educated about the importance of limiting  Carbohydrate intake , the need to commit to daily physical activity for a minimum of 30 minutes , and to commit weight loss. The fact that changes in all these areas will reduce or eliminate all together the development of diabetes is stressed.   Diabetic Labs Latest Ref Rng & Units 04/15/2017 05/15/2015 07/12/2014 07/15/2013 02/24/2013  HbA1c <5.7 % of total Hgb 5.5 - - 5.7(H) 6.0(H)  Chol 125 - 200  mg/dL - 171 - - 171  HDL >=46 mg/dL - 63 - - 60  Calc LDL <130 mg/dL - 96 - - 102(H)  Triglycerides <150 mg/dL - 62 - - 46  Creatinine 0.50 - 1.05 mg/dL 0.89 0.72 0.69 0.70 0.76   BP/Weight 11/29/2018 11/23/2018 08/03/2018 05/14/2017 04/08/2017 12/17/2016 2/40/9735  Systolic BP 329 924 268 341 962 229 798  Diastolic BP 82 95 91 76 80 88 80  Wt. (Lbs) 284 280 275 281 286 285.04 288  BMI 44.48 43.85 43.07 44.01 43.49 43.34 43.79   No flowsheet data found. Updated lab needed at/ before next visit.

## 2018-11-29 NOTE — Assessment & Plan Note (Signed)
Patient educated about the importance of limiting  Carbohydrate intake , the need to commit to daily physical activity for a minimum of 30 minutes , and to commit weight loss. The fact that changes in all these areas will reduce or eliminate all together the development of diabetes is stressed.   Diabetic Labs Latest Ref Rng & Units 04/15/2017 05/15/2015 07/12/2014 07/15/2013 02/24/2013  HbA1c <5.7 % of total Hgb 5.5 - - 5.7(H) 6.0(H)  Chol 125 - 200 mg/dL - 171 - - 171  HDL >=46 mg/dL - 63 - - 60  Calc LDL <130 mg/dL - 96 - - 102(H)  Triglycerides <150 mg/dL - 62 - - 46  Creatinine 0.50 - 1.05 mg/dL 0.89 0.72 0.69 0.70 0.76   BP/Weight 11/29/2018 11/23/2018 08/03/2018 05/14/2017 04/08/2017 12/17/2016 9/35/5217  Systolic BP 471 595 396 728 979 150 413  Diastolic BP 82 95 91 76 80 88 80  Wt. (Lbs) 284 280 275 281 286 285.04 288  BMI 44.48 43.85 43.07 44.01 43.49 43.34 43.79   No flowsheet data found. Updated lab needed at/ before next visit.

## 2018-11-29 NOTE — Assessment & Plan Note (Signed)
  Patient re-educated about  the importance of commitment to a  minimum of 150 minutes of exercise per week as able.  The importance of healthy food choices with portion control discussed, as well as eating regularly and within a 12 hour window most days. The need to choose "clean , green" food 50 to 75% of the time is discussed, as well as to make water the primary drink and set a goal of 64 ounces water daily.    Weight /BMI 11/29/2018 11/23/2018 08/03/2018  WEIGHT 284 lb 280 lb 275 lb  HEIGHT 5\' 7"  5\' 7"  5\' 7"   BMI 44.48 kg/m2 43.85 kg/m2 43.07 kg/m2  Start half phentermine daily, weight loss goal of 4 pounds/ month

## 2018-11-29 NOTE — Assessment & Plan Note (Signed)
Controlled, no change in medication DASH diet and commitment to daily physical activity for a minimum of 30 minutes discussed and encouraged, as a part of hypertension management. The importance of attaining a healthy weight is also discussed.  BP/Weight 11/29/2018 11/23/2018 08/03/2018 05/14/2017 04/08/2017 12/17/2016 01/24/538  Systolic BP 767 341 937 902 409 735 329  Diastolic BP 82 95 91 76 80 88 80  Wt. (Lbs) 284 280 275 281 286 285.04 288  BMI 44.48 43.85 43.07 44.01 43.49 43.34 43.79

## 2018-11-29 NOTE — Patient Instructions (Addendum)
F/U with weight management and pap with mD in 3.5 months, call if you need me sooner  Mammogram is past due, please schedule asap, not Jul;y 13  Fasting labs at [facility] on a Saturday ( not this one, July 4!)cBC, lipid, cmp and eGGFR, hBA1C, tSH and vit D  Take tylenol arthritis or ES tylebnol 1 to 2 daily to help with knee pain and work on weight loss, samples of tylenol p[provided   New to help with weight loss is half phentermine daily, goal is 2.5 to 3 pounds per month Think about what you will eat, plan ahead. Choose " clean, green, fresh or frozen" over canned, processed or packaged foods which are more sugary, salty and fatty. 70 to 75% of food eaten should be vegetables and fruit. Three meals at set times with snacks allowed between meals, but they must be fruit or vegetables. Aim to eat over a 12 hour period , example 7 am to 7 pm, and STOP after  your last meal of the day. Drink water,generally about 64 ounces per day, no other drink is as healthy. Fruit juice is best enjoyed in a healthy way, by EATING the fruit.

## 2018-12-02 ENCOUNTER — Telehealth: Payer: Self-pay | Admitting: Orthopaedic Surgery

## 2018-12-02 NOTE — Telephone Encounter (Signed)
Patient said she has taken all of the Prednisone and wants refill on this  She uses [facility] in [place]

## 2018-12-06 ENCOUNTER — Other Ambulatory Visit: Payer: Self-pay | Admitting: Family Medicine

## 2018-12-07 MED ORDER — PREDNISONE 5 MG (21) PO TBPK: ORAL_TABLET | ORAL | 0 refills | Status: DC

## 2018-12-14 ENCOUNTER — Ambulatory Visit (INDEPENDENT_AMBULATORY_CARE_PROVIDER_SITE_OTHER): Payer: Medicare Other | Admitting: Orthopaedic Surgery

## 2018-12-14 ENCOUNTER — Other Ambulatory Visit: Payer: Self-pay

## 2018-12-14 ENCOUNTER — Ambulatory Visit: Payer: Medicare Other | Admitting: Orthopaedic Surgery

## 2018-12-14 ENCOUNTER — Encounter: Payer: Self-pay | Admitting: Orthopaedic Surgery

## 2018-12-14 VITALS — BP 130/90 | HR 102 | Temp 96.6°F | Ht 67.0 in | Wt 271.0 lb

## 2018-12-14 DIAGNOSIS — M25562 Pain in left knee: Secondary | ICD-10-CM | POA: Diagnosis not present

## 2018-12-14 DIAGNOSIS — G8929 Other chronic pain: Secondary | ICD-10-CM

## 2018-12-14 NOTE — Progress Notes (Signed)
Patient Suzanne Andrews, female DOB:[birth year], 53 y.o. [id number]  Chief Complaint  Patient presents with  . Knee Pain    left     HPI  Suzanne Andrews is a 53 y.o. female who has continued pain of the left knee.  The prednisone helped but she still has pain. The Synvisc helped the right knee but not the left knee. She is considering total knee. I will have Dr. Aline Brochure see her.   Body mass index is 42.44 kg/m.  The patient meets the AMA guidelines for Morbid (severe) obesity with a BMI > 40.0 and I have recommended weight loss.   ROS  Review of Systems  Constitutional: Positive for activity change.  HENT: Negative for congestion.   Respiratory: Negative for cough and shortness of breath.   Cardiovascular: Negative for chest pain and leg swelling.  Endocrine: Positive for cold intolerance.  Musculoskeletal: Positive for arthralgias, gait problem and joint swelling.  Allergic/Immunologic: Positive for environmental allergies.  All other systems reviewed and are negative.   All other systems reviewed and are negative.  The following is a summary of the past history medically, past history surgically, known current medicines, social history and family history.  This information is gathered electronically by the computer from prior information and documentation.  I review this each visit and have found including this information at this point in the chart is beneficial and informative.    Past Medical History:  Diagnosis Date  . Hypertension   . PVD (peripheral vascular disease) ([place])     Past Surgical History:  Procedure Laterality Date  . CESAREAN SECTION     x 2  . CHOLECYSTECTOMY    . KNEE ARTHROSCOPY WITH MEDIAL MENISECTOMY Left 07/18/2014   Procedure: LEFT KNEE ARTHROSCOPY WITH MEDIAL AND LATERAL MENISECTOMY;  Surgeon: Sanjuana Kava, MD;  Location: [facility];  Service: Orthopedics;  Laterality: Left;  . TUBAL LIGATION    . vascular surgery bilateral leg       Family History  Problem Relation Age of Onset  . Hypertension Mother   . Cancer Father   . Hypertension Sister   . Cancer Sister        breast  . Cancer Brother   . Hypertension Brother   . Cancer Brother     Social History Social History   Tobacco Use  . Smoking status: Never Smoker  . Smokeless tobacco: Never Used  Substance Use Topics  . Alcohol use: No  . Drug use: No    No Known Allergies  Current Outpatient Medications  Medication Sig Dispense Refill  . lisinopril-hydrochlorothiazide (ZESTORETIC) 20-25 MG tablet TAKE 1 TABLET BY MOUTH ONCE DAILY - NEEDS TO CALL OFFICE AND SCHEDULE APPT. HASN'T BEEN SEEN IN OVER 1 YEAR 15 tablet 0  . loratadine (CLARITIN) 10 MG tablet Take 10 mg by mouth daily as needed for allergies.     . phentermine (ADIPEX-P) 37.5 MG tablet Take half tablet by mouth every morning before breakfast for appetite suppression 30 tablet 1  . predniSONE (STERAPRED UNI-PAK 21 TAB) 5 MG (21) TBPK tablet Take 6 pills first day; 5 pills second day; 4 pills third day; 3 pills fourth day; 2 pills next day and 1 pill last day. 21 tablet 0  . VITAMIN D PO Take 1 capsule by mouth daily.    . naproxen (NAPROSYN) 500 MG tablet Take 1 tablet (500 mg total) by mouth 2 (two) times daily with a meal. (Patient not taking: Reported on 12/14/2018)  60 tablet 5   No current facility-administered medications for this visit.      Physical Exam  Blood pressure 130/90, pulse (!) 102, temperature (!) 96.6 F (35.9 C), height 5\' 7"  (1.702 m), weight 271 lb (122.9 kg).  Constitutional: overall normal hygiene, normal nutrition, well developed, normal grooming, normal body habitus. Assistive device:none  Musculoskeletal: gait and station Limp left, muscle tone and strength are normal, no tremors or atrophy is present.  .  Neurological: coordination overall normal.  Deep tendon reflex/nerve stretch intact.  Sensation normal.  Cranial nerves II-XII intact.   Skin:   Normal  overall no scars, lesions, ulcers or rashes. No psoriasis.  Psychiatric: Alert and oriented x 3.  Recent memory intact, remote memory unclear.  Normal mood and affect. Well groomed.  Good eye contact.  Cardiovascular: overall no swelling, no varicosities, no edema bilaterally, normal temperatures of the legs and arms, no clubbing, cyanosis and good capillary refill.  Lymphatic: palpation is normal.  Left knee has diffuse pain, crepitus, ROM 0 to 105, NV intact and limp to the left.  All other systems reviewed and are negative   The patient has been educated about the nature of the problem(s) and counseled on treatment options.  The patient appeared to understand what I have discussed and is in agreement with it.  Encounter Diagnosis  Name Primary?  . Chronic pain of left knee Yes    PLAN Call if any problems.  Precautions discussed.  Continue current medications.   Return to clinic to see Dr. Aline Brochure   Electronically Signed Sanjuana Kava, MD 7/14/202010:51 AM

## 2018-12-15 ENCOUNTER — Ambulatory Visit (HOSPITAL_COMMUNITY)
Admission: RE | Admit: 2018-12-15 | Discharge: 2018-12-15 | Disposition: A | Discharge status: Home / Self Care | Payer: Medicare Other | Source: Ambulatory Visit | Attending: Family Medicine | Admitting: Family Medicine

## 2018-12-15 DIAGNOSIS — Z1231 Encounter for screening mammogram for malignant neoplasm of breast: Secondary | ICD-10-CM

## 2018-12-16 ENCOUNTER — Ambulatory Visit: Payer: Medicare Other | Admitting: Orthopaedic Surgery

## 2018-12-17 NOTE — Progress Notes (Signed)
No mammographic evidence of malignancy. Screening in 1 year

## 2018-12-24 DIAGNOSIS — I10 Essential (primary) hypertension: Secondary | ICD-10-CM | POA: Diagnosis not present

## 2018-12-24 DIAGNOSIS — R7301 Impaired fasting glucose: Secondary | ICD-10-CM | POA: Diagnosis not present

## 2018-12-24 DIAGNOSIS — E559 Vitamin D deficiency, unspecified: Secondary | ICD-10-CM | POA: Diagnosis not present

## 2018-12-26 LAB — LIPID PANEL WITH LDL/HDL RATIO
Cholesterol, Total: 180 mg/dL (ref 100–199)
HDL: 58 mg/dL (ref >39)
LDL Calculated: 110 mg/dL — ABNORMAL HIGH (ref 0–99)
LDl/HDL Ratio: 1.9 ratio (ref 0.0–3.2)
Triglycerides: 62 mg/dL (ref 0–149)
VLDL Cholesterol Cal: 12 mg/dL (ref 5–40)

## 2018-12-26 LAB — CMP14+EGFR
ALT: 16 IU/L (ref 0–32)
AST: 22 IU/L (ref 0–40)
Albumin/Globulin Ratio: 1.6 (ref 1.2–2.2)
Albumin: 4.2 g/dL (ref 3.8–4.9)
Alkaline Phosphatase: 76 IU/L (ref 39–117)
BUN/Creatinine Ratio: 17 (ref 9–23)
BUN: 13 mg/dL (ref 6–24)
Bilirubin Total: 0.3 mg/dL (ref 0.0–1.2)
CO2: 24 mmol/L (ref 20–29)
Calcium: 9.9 mg/dL (ref 8.7–10.2)
Chloride: 102 mmol/L (ref 96–106)
Creatinine, Ser: 0.77 mg/dL (ref 0.57–1.00)
GFR calc Af Amer: 102 mL/min/{1.73_m2} (ref >59)
GFR calc non Af Amer: 88 mL/min/{1.73_m2} (ref >59)
Globulin, Total: 2.6 g/dL (ref 1.5–4.5)
Glucose: 90 mg/dL (ref 65–99)
Potassium: 4.3 mmol/L (ref 3.5–5.2)
Sodium: 142 mmol/L (ref 134–144)
Total Protein: 6.8 g/dL (ref 6.0–8.5)

## 2018-12-26 LAB — CBC
Hematocrit: 39.2 % (ref 34.0–46.6)
Hemoglobin: 12.6 g/dL (ref 11.1–15.9)
MCH: 28.6 pg (ref 26.6–33.0)
MCHC: 32.1 g/dL (ref 31.5–35.7)
MCV: 89 fL (ref 79–97)
Platelets: 308 10*3/uL (ref 150–450)
RBC: 4.4 x10E6/uL (ref 3.77–5.28)
RDW: 12.9 % (ref 11.7–15.4)
WBC: 5.7 10*3/uL (ref 3.4–10.8)

## 2018-12-26 LAB — TSH: TSH: 2.03 u[IU]/mL (ref 0.450–4.500)

## 2018-12-26 LAB — HEMOGLOBIN A1C
Est. average glucose Bld gHb Est-mCnc: 120 mg/dL
Hgb A1c MFr Bld: 5.8 % — ABNORMAL HIGH (ref 4.8–5.6)

## 2018-12-26 LAB — VITAMIN D 25 HYDROXY (VIT D DEFICIENCY, FRACTURES): Vit D, 25-Hydroxy: 29 ng/mL — ABNORMAL LOW (ref 30.0–100.0)

## 2018-12-29 ENCOUNTER — Ambulatory Visit: Payer: Medicare Other

## 2018-12-29 ENCOUNTER — Other Ambulatory Visit: Payer: Self-pay

## 2018-12-29 ENCOUNTER — Telehealth: Payer: Self-pay | Admitting: Family Medicine

## 2018-12-29 ENCOUNTER — Ambulatory Visit (INDEPENDENT_AMBULATORY_CARE_PROVIDER_SITE_OTHER): Payer: Medicare Other | Admitting: Orthopedic Surgery

## 2018-12-29 ENCOUNTER — Encounter: Payer: Self-pay | Admitting: Orthopedic Surgery

## 2018-12-29 VITALS — BP 146/77 | HR 98 | Temp 98.2°F | Ht 67.0 in | Wt 280.0 lb

## 2018-12-29 DIAGNOSIS — M25562 Pain in left knee: Secondary | ICD-10-CM

## 2018-12-29 DIAGNOSIS — G8929 Other chronic pain: Secondary | ICD-10-CM

## 2018-12-29 DIAGNOSIS — M170 Bilateral primary osteoarthritis of knee: Secondary | ICD-10-CM

## 2018-12-29 NOTE — Progress Notes (Signed)
preop consult  Requested by Dr Rolanda Lundborg  12/29/2018  HISTORY SECTION :  Chief Complaint  Patient presents with  . Knee Pain    left knee worse than right/ discuss total knee replacement per Dr Luna Glasgow    HPI The patient presents for consultation possible surgery for left total knee replacement  The patient has been adequately treated with nonoperative treatments including but not limited to naproxen ibuprofen Synvisc and cortisone injections but her left knee seems to be bothering her quite a bit requiring a prednisone Dosepak to get it under control.   Location left knee medial joint line Duration at least 3 years Quality dull ache Severity moderate Associated with mild functional deficits  Review of Systems  Constitutional: Negative for fever.  Respiratory: Negative for shortness of breath.   Cardiovascular: Negative for chest pain.     Past Medical History:  Diagnosis Date  . Hypertension   . PVD (peripheral vascular disease) ([place])     Past Surgical History:  Procedure Laterality Date  . CESAREAN SECTION     x 2  . CHOLECYSTECTOMY    . KNEE ARTHROSCOPY WITH MEDIAL MENISECTOMY Left 07/18/2014   Procedure: LEFT KNEE ARTHROSCOPY WITH MEDIAL AND LATERAL MENISECTOMY;  Surgeon: Sanjuana Kava, MD;  Location: [facility];  Service: Orthopedics;  Laterality: Left;  . TUBAL LIGATION    . vascular surgery bilateral leg       No Known Allergies   Current Outpatient Medications:  .  lisinopril-hydrochlorothiazide (ZESTORETIC) 20-25 MG tablet, TAKE 1 TABLET BY MOUTH ONCE DAILY - NEEDS TO CALL OFFICE AND SCHEDULE APPT. HASN'T BEEN SEEN IN OVER 1 YEAR, Disp: 15 tablet, Rfl: 0 .  loratadine (CLARITIN) 10 MG tablet, Take 10 mg by mouth daily as needed for allergies. , Disp: , Rfl:  .  naproxen (NAPROSYN) 500 MG tablet, Take 1 tablet (500 mg total) by mouth 2 (two) times daily with a meal., Disp: 60 tablet, Rfl: 5 .  phentermine (ADIPEX-P) 37.5 MG tablet, Take  half tablet by mouth every morning before breakfast for appetite suppression, Disp: 30 tablet, Rfl: 1 .  VITAMIN D PO, Take 1 capsule by mouth daily., Disp: , Rfl:    PHYSICAL EXAM SECTION: 1) BP (!) 146/77   Pulse 98   Temp 98.2 F (36.8 C)   Ht 5\' 7"  (1.702 m)   Wt 280 lb (127 kg)   BMI 43.85 kg/m   Body mass index is 43.85 kg/m. General appearance: Well-developed well-nourished no gross deformities  2) Cardiovascular normal pulse and perfusion in all 4 extremities normal color without edema, history of bilateral leg edema wears compression hose had a DVT study which was negative  3) Neurologically deep tendon reflexes are equal and normal, no sensation loss or deficits no pathologic reflexes  4) Psychological: Awake alert and oriented x3 mood and affect normal  5) Skin no lacerations or ulcerations no nodularity no palpable masses, no erythema or nodularity  6) Musculoskeletal:  Left knee Tenderness in the medial joint line varus alignment 0 to 90 degrees range of motion stable ligaments normal muscle tone  Right knee Mild tenderness medial joint line varus alignment 0 to 90 degrees flexion stable ligaments normal muscle tone  MEDICAL DECISION SECTION:  Encounter Diagnoses  Name Primary?  . Chronic pain of left knee Yes  . Primary osteoarthritis of both knees     Imaging   X-ray today shows bilateral varus knees with severe deformity obliteration of the  medial compartment excessive and severe and extensive secondary bone changes subluxation of the joint rotation of the tibia  Impression severe arthritis of both knees with severe deformities   Plan:  (Rx., Inj., surg., Frx, MRI/CT, XR:2)  I explained to the patient the risks of obesity and knee replacement I recommend she lose about 25 pounds she is on Adipex she will call us back when her weight gets near 250    2:41 PM Arther Abbott, MD  12/29/2018

## 2018-12-29 NOTE — Telephone Encounter (Signed)
Pt called and states that she needs a 30 supply of her lisinpril , the pharmacy only gave a few days

## 2018-12-29 NOTE — Patient Instructions (Addendum)
Please try to lose about 25 pounds.  Call the office to be weighed again when your weight reaches about 250 pounds  Below there is some information regarding knee replacement important for you to read     Preparing for Knee Replacement Getting prepared before knee replacement surgery can make your recovery easier and more comfortable. This document provides some tips and guidelines that will help you prepare for your surgery. Talk with your health care provider so you can learn what to expect before, during, and after surgery. Ask questions if you do not understand something. To ease concerns about your financial responsibilities, call your insurance company as soon as you decide to have surgery. Ask how much of your surgery and hospital stay will be covered. Also ask about coverage for medical equipment, rehabilitation facilities, and home care. How should I arrange for help? In the first couple weeks after surgery, it will likely be harder for you to do some of your regular activities. You may get tired easily, and you will have limited movement in your leg. Follow these guidelines to make sure you have all the help you need after your surgery:  Plan to have someone take you home from the hospital. Your health care provider will tell you how many days you can expect to be in the hospital.  Cancel all your work, caregiving, and volunteer responsibilities for at least 4-6 weeks after your surgery.  Plan to have someone stay with you day and night for the first week. This person should be someone you are comfortable with. You may need this person to help you with your exercises and personal care, such as bathing and using the toilet.  If you live alone, arrange for someone to take care of your home and pets for the first 4-6 weeks after surgery.  Arrange for drivers to take you to and from follow-up appointments, the grocery store, and other places you may need to go for at least 4-6  weeks.  Consider applying for a disabled parking permit. To get an application, contact the Department of Motor Vehicles or your health care provider's office. How should I prepare my home?      Pick a recovery spot, but do not plan on recovering in bed. Sitting upright is better for your health. You may want to use a recliner with a small table nearby. Place the items you use most frequently on that table. These may include the TV remote, a cordless phone, a cell phone, a book or laptop computer, and a water glass.  To see if you will be able to move around in your home with a walker, hold your hands out about 6 inches (15 cm) from your sides, and walk from your recovery spot to your kitchen and bathroom. Then walk from your bed to the bathroom. If you do not hit anything with your hands, you will have enough room for a walker.  Minimize the use of stairs after you return home to reduce your risk of falling or tripping.  Remove all clutter from your floors. Also remove any throw rugs. This will help you avoid tripping after your surgery.  Move the items you use most often in your kitchen, bathroom, and bedroom to shelves and drawers that are at countertop height.  Prepare a few meals to freeze and reheat later.  Consider getting safety equipment that will be helpful during your recovery, such as: ? Grab bars added in the shower and near the  toilet. ? A raised toilet seat to help you get on and off the toilet more easily. ? A tub or shower bench. How should I prepare my body?  Have a preoperative exam. ? During the exam, your health care provider will make sure that your body is healthy enough to safely have the surgery. ? When you go to the exam, bring a complete list of all your medicines and supplements, including herbs and vitamins. ? You may need to have additional tests to ensure your safety.  Have elective dental care and routine cleanings done before your surgery. Germs from  anywhere in your body, including your mouth, can travel to your new joint and infect it. It is important that you do not have any dental work done for at least 3 months after your surgery.  Maintain a healthy diet. Do not change your diet before surgery unless your health care provider tells you to do that.  Do not use any products that contain nicotine or tobacco, such as cigarettes and e-cigarettes. These can delay bone healing after surgery. If you need help quitting, ask your health care provider.  Tell your health care provider if: ? You develop any skin infections or skin irritations. You may need to improve the condition of your skin before surgery. ? You have a fever, a cold, or any other illness in the week before your surgery.  Do not drink any alcohol for at least 48 hours before surgery.  The day before your surgery, follow instructions from your health care provider about showering, eating, drinking, and taking medicines. These directions are for your safety.  Talk to your health care provider about doing exercises before your surgery. ? Be sure to follow the exercise program only as directed by your health care provider. ? Doing these exercises in the weeks before your surgery may help reduce pain and improve function after surgery. Summary  Getting prepared before knee replacement surgery can make your recovery easier and more comfortable.  Prepare your home and arrange for help at home.  Keep all of your preoperative appointments to ensure that you are ready for your surgery.  Plan to have someone take you home from the hospital and stay with you day and night for the first week. This information is not intended to replace advice given to you by your health care provider. Make sure you discuss any questions you have with your health care provider. Document Released: 08/23/2010 Document Revised: 07/06/2017 Document Reviewed: 07/06/2017 Elsevier Patient Education  2020  [employer].

## 2018-12-30 ENCOUNTER — Other Ambulatory Visit: Payer: Self-pay

## 2018-12-30 MED ORDER — LISINOPRIL-HYDROCHLOROTHIAZIDE 20-25 MG PO TABS: ORAL_TABLET | ORAL | 0 refills | Status: DC

## 2018-12-30 NOTE — Telephone Encounter (Signed)
Prescription has been filled  

## 2019-01-04 ENCOUNTER — Other Ambulatory Visit: Payer: Self-pay | Admitting: Family Medicine

## 2019-01-24 ENCOUNTER — Other Ambulatory Visit: Payer: Self-pay

## 2019-01-24 DIAGNOSIS — Z20822 Contact with and (suspected) exposure to covid-19: Secondary | ICD-10-CM

## 2019-01-25 LAB — NOVEL CORONAVIRUS, NAA: SARS-CoV-2, NAA: NOT DETECTED

## 2019-02-11 DIAGNOSIS — M170 Bilateral primary osteoarthritis of knee: Secondary | ICD-10-CM | POA: Diagnosis not present

## 2019-03-09 ENCOUNTER — Encounter (HOSPITAL_COMMUNITY): Payer: Self-pay | Admitting: Emergency Medicine

## 2019-03-09 ENCOUNTER — Emergency Department (HOSPITAL_COMMUNITY)
Admission: EM | Admit: 2019-03-09 | Discharge: 2019-03-09 | Disposition: A | Discharge status: Home / Self Care | Payer: Medicare Other | Attending: Emergency Medicine | Admitting: Emergency Medicine

## 2019-03-09 ENCOUNTER — Other Ambulatory Visit: Payer: Self-pay

## 2019-03-09 DIAGNOSIS — S90811A Abrasion, right foot, initial encounter: Secondary | ICD-10-CM | POA: Insufficient documentation

## 2019-03-09 DIAGNOSIS — L089 Local infection of the skin and subcutaneous tissue, unspecified: Secondary | ICD-10-CM | POA: Diagnosis not present

## 2019-03-09 DIAGNOSIS — T148XXA Other injury of unspecified body region, initial encounter: Secondary | ICD-10-CM

## 2019-03-09 DIAGNOSIS — Y999 Unspecified external cause status: Secondary | ICD-10-CM | POA: Insufficient documentation

## 2019-03-09 DIAGNOSIS — I10 Essential (primary) hypertension: Secondary | ICD-10-CM | POA: Diagnosis not present

## 2019-03-09 DIAGNOSIS — S99921A Unspecified injury of right foot, initial encounter: Secondary | ICD-10-CM | POA: Diagnosis present

## 2019-03-09 DIAGNOSIS — X58XXXA Exposure to other specified factors, initial encounter: Secondary | ICD-10-CM | POA: Diagnosis not present

## 2019-03-09 DIAGNOSIS — Y929 Unspecified place or not applicable: Secondary | ICD-10-CM | POA: Diagnosis not present

## 2019-03-09 DIAGNOSIS — Y9389 Activity, other specified: Secondary | ICD-10-CM | POA: Insufficient documentation

## 2019-03-09 DIAGNOSIS — Z79899 Other long term (current) drug therapy: Secondary | ICD-10-CM | POA: Insufficient documentation

## 2019-03-09 MED ORDER — SULFAMETHOXAZOLE-TRIMETHOPRIM 800-160 MG PO TABS
1.0000 | ORAL_TABLET | Freq: Once | ORAL | Status: AC
  Administered 2019-03-09: 1 via ORAL
  Filled 2019-03-09: qty 1

## 2019-03-09 MED ORDER — SULFAMETHOXAZOLE-TRIMETHOPRIM 800-160 MG PO TABS: 1.0000 | ORAL_TABLET | Freq: Two times a day (BID) | ORAL | 0 refills | Status: AC

## 2019-03-09 NOTE — Discharge Instructions (Addendum)
See Dr. Moshe Cipro for recheck in 2-3 days

## 2019-03-09 NOTE — ED Triage Notes (Signed)
Pt states she scraped the top of her right foot with a shoe and has been having issues with a wound healing; pt states she applied a una boot today and it started burning and pt states the pain was unbearable; pt has 2 red open wounds to top of right foot

## 2019-03-10 NOTE — ED Provider Notes (Signed)
[facility] EMERGENCY DEPARTMENT Provider Note   CSN: [id number] Arrival date & time: 03/09/19  2014     History   Chief Complaint Chief Complaint  Patient presents with  . Foot Pain    HPI Suzanne Andrews is a 53 y.o. female.     The history is provided by the patient. No language interpreter was used.  Foot Pain This is a new problem. The current episode started yesterday. The problem occurs constantly. The problem has been gradually worsening. Nothing aggravates the symptoms. Nothing relieves the symptoms. She has tried nothing for the symptoms. The treatment provided no relief.  Pt reports she has vascular disease in her legs and sees Dr. Renaldo Reel at the vein clinic.  Pt reports she has problems with getting sores and swelling in her feet.  Pt reports she ordered an IT trainer dressing and used yesterday. Pt reports dressing caused the skin on her foot to blister.   Past Medical History:  Diagnosis Date  . Hypertension   . PVD (peripheral vascular disease) [facility])     Patient Active Problem List   Diagnosis Date Noted  . Venous insufficiency of both lower extremities 12/17/2016  . Right knee pain 08/02/2015  . Pain in joint, ankle and foot 05/15/2015  . Ovarian cyst, left 04/04/2011  . Prediabetes 04/04/2011  . MENORRHAGIA 10/15/2009  . NEVI, MULTIPLE 08/02/2009  . CAPILLARY HEMANGIOMA 08/02/2009  . Osteoarthritis, knee 08/02/2009  . ALLERGIC RHINITIS 11/21/2008  . Morbid obesity ([place]) 02/01/2008  . Essential hypertension 02/01/2008    Past Surgical History:  Procedure Laterality Date  . CESAREAN SECTION     x 2  . CHOLECYSTECTOMY    . KNEE ARTHROSCOPY WITH MEDIAL MENISECTOMY Left 07/18/2014   Procedure: LEFT KNEE ARTHROSCOPY WITH MEDIAL AND LATERAL MENISECTOMY;  Surgeon: Sanjuana Kava, MD;  Location: [facility];  Service: Orthopedics;  Laterality: Left;  . TUBAL LIGATION    . vascular surgery bilateral leg       OB History   No obstetric history on file.       Home Medications    Prior to Admission medications   Medication Sig Start Date End Date Taking? Authorizing Provider  lisinopril-hydrochlorothiazide (ZESTORETIC) 20-25 MG tablet TAKE 1 TABLET BY MOUTH ONCE DAILY -- NEEDS TO CALL OFFICE AND SCHEDULE APPT. HASN'T BEEN SEEN IN OVER 1 YEAR 01/05/19   Fayrene Helper, MD  loratadine (CLARITIN) 10 MG tablet Take 10 mg by mouth daily as needed for allergies.     [provider]  naproxen (NAPROSYN) 500 MG tablet Take 1 tablet (500 mg total) by mouth 2 (two) times daily with a meal. 08/31/18   Sanjuana Kava, MD  phentermine (ADIPEX-P) 37.5 MG tablet Take half tablet by mouth every morning before breakfast for appetite suppression 11/29/18   Fayrene Helper, MD  sulfamethoxazole-trimethoprim (BACTRIM DS) 800-160 MG tablet Take 1 tablet by mouth 2 (two) times daily for 7 days. 03/09/19 03/16/19  Fransico Meadow, PA-C  VITAMIN D PO Take 1 capsule by mouth daily.    [provider]    Family History Family History  Problem Relation Age of Onset  . Hypertension Mother   . Cancer Father   . Hypertension Sister   . Cancer Sister        breast  . Cancer Brother   . Hypertension Brother   . Cancer Brother     Social History Social History   Tobacco Use  . Smoking status: Never Smoker  .  Smokeless tobacco: Never Used  Substance Use Topics  . Alcohol use: No  . Drug use: No     Allergies   Patient has no known allergies.   Review of Systems Review of Systems  All other systems reviewed and are negative.    Physical Exam Updated Vital Signs BP (!) 150/78 (BP Location: Right Arm)   Pulse 85   Temp 97.9 F (36.6 C) (Oral)   Resp 16   SpO2 100%   Physical Exam Vitals signs and nursing note reviewed.  Constitutional:      Appearance: She is well-developed.  HENT:     Head: Normocephalic.  Neck:     Musculoskeletal: Normal range of motion.  Pulmonary:     Effort: Pulmonary effort is normal.   Abdominal:     General: There is no distension.  Musculoskeletal: Normal range of motion.     Comments: Erythema right foot,  2 small abrasions/ wounds superficial,  Good pulse (doppler by Rn) good cap refill toes   Skin:    General: Skin is warm.  Neurological:     General: No focal deficit present.     Mental Status: She is alert and oriented to person, place, and time.  Psychiatric:        Mood and Affect: Mood normal.      ED Treatments / Results  Labs (all labs ordered are listed, but only abnormal results are displayed) Labs Reviewed - No data to display  EKG None  Radiology No results found.  Procedures Procedures (including critical care time)  Medications Ordered in ED Medications  sulfamethoxazole-trimethoprim (BACTRIM DS) 800-160 MG per tablet 1 tablet (1 tablet Oral Given 03/09/19 2210)     Initial Impression / Assessment and Plan / ED Course  I have reviewed the triage vital signs and the nursing notes.  Pertinent labs & imaging results that were available during my care of the patient were reviewed by me and considered in my medical decision making (see chart for details).        Pt given rx for bactrim.  Pt advised to see her MD for recheck of wound in 2 days   Final Clinical Impressions(s) / ED Diagnoses   Final diagnoses:  Wound infection    ED Discharge Orders         Ordered    sulfamethoxazole-trimethoprim (BACTRIM DS) 800-160 MG tablet  2 times daily     03/09/19 2139        An After Visit Summary was printed and given to the patient.    Fransico Meadow, PA-C 03/10/19 Lynden Ang, MD 03/18/19 [phone]

## 2019-03-15 DIAGNOSIS — L97211 Non-pressure chronic ulcer of right calf limited to breakdown of skin: Secondary | ICD-10-CM | POA: Diagnosis not present

## 2019-03-15 DIAGNOSIS — I8311 Varicose veins of right lower extremity with inflammation: Secondary | ICD-10-CM | POA: Diagnosis not present

## 2019-03-17 ENCOUNTER — Other Ambulatory Visit: Payer: Self-pay

## 2019-03-17 ENCOUNTER — Encounter (INDEPENDENT_AMBULATORY_CARE_PROVIDER_SITE_OTHER): Payer: Self-pay

## 2019-03-17 ENCOUNTER — Encounter: Payer: Self-pay | Admitting: Family Medicine

## 2019-03-17 ENCOUNTER — Other Ambulatory Visit (HOSPITAL_COMMUNITY)
Admission: RE | Admit: 2019-03-17 | Discharge: 2019-03-17 | Disposition: A | Discharge status: Home / Self Care | Payer: Medicare Other | Source: Ambulatory Visit | Attending: Family Medicine | Admitting: Family Medicine

## 2019-03-17 ENCOUNTER — Ambulatory Visit (INDEPENDENT_AMBULATORY_CARE_PROVIDER_SITE_OTHER): Payer: Medicare Other | Admitting: Family Medicine

## 2019-03-17 VITALS — BP 114/80 | HR 95 | Temp 97.3°F | Ht 67.0 in | Wt 268.0 lb

## 2019-03-17 DIAGNOSIS — I10 Essential (primary) hypertension: Secondary | ICD-10-CM | POA: Diagnosis not present

## 2019-03-17 DIAGNOSIS — Z124 Encounter for screening for malignant neoplasm of cervix: Secondary | ICD-10-CM

## 2019-03-17 DIAGNOSIS — Z1151 Encounter for screening for human papillomavirus (HPV): Secondary | ICD-10-CM | POA: Diagnosis not present

## 2019-03-17 DIAGNOSIS — Z23 Encounter for immunization: Secondary | ICD-10-CM | POA: Diagnosis not present

## 2019-03-17 MED ORDER — BETAMETHASONE DIPROPIONATE 0.05 % EX CREA: TOPICAL_CREAM | CUTANEOUS | 0 refills | Status: DC

## 2019-03-17 MED ORDER — PHENTERMINE HCL 37.5 MG PO TABS: ORAL_TABLET | ORAL | 1 refills | Status: DC

## 2019-03-17 NOTE — Progress Notes (Signed)
Suzanne Andrews     MRN: [id number]      DOB: 03/02/1966  HPI: Patient is in for her pap and f/u chronic medical problems in particular obesity. She denies adverse reaction to phentermine and has done very well with weight loss Recent flare of skin breakdown of RLE and will f/u with vascular, foot currently wrapped C/o facial rash once she started septra recently prescribed No other health concerns are expressed or addressed at the visit. Recent labs, if available are reviewed. Immunization is reviewed , and  updated   PE: BP 114/80   Pulse 95   Temp (!) 97.3 F (36.3 C) (Temporal)   Ht 5\' 7"  (1.702 m)   Wt 268 lb (121.6 kg)   SpO2 100%   BMI 41.97 kg/m   Pleasant  female, alert and oriented x 3, in no cardio-pulmonary distress. Afebrile. HEENT No facial trauma or asymetry. Sinuses non tender.  Extra occullar muscles intact.. External ears normal, . Neck: supple, no adenopathy,JVD or thyromegaly.No bruits.  Chest: Clear to ascultation bilaterally.No crackles or wheezes. Non tender to palpation  Breast: No asymetry,no masses or lumps. No tenderness. No nipple discharge or inversion. No axillary or supraclavicular adenopathy  Cardiovascular system; Heart sounds normal,  S1 and  S2 ,no S3.  No murmur, or thrill. Apical beat not displaced Peripheral pulses normal.  Abdomen: Soft, non tender, no organomegaly or masses. No bruits. Bowel sounds normal. No guarding, tenderness or rebound.   GU: External genitalia normal female genitalia , normal female distribution of hair. No lesions. Urethral meatus normal in size, no  Prolapse, no lesions visibly  Present. Bladder non tender. Vagina pink and moist , with no visible lesions , discharge present . Adequate pelvic support no  cystocele or rectocele noted Cervix pink and appears healthy, no lesions or ulcerations noted, no discharge noted from os Uterus normal size, no adnexal masses, no cervical motion or adnexal  tenderness.   Musculoskeletal exam: Full ROM of spine, hips , shoulders and knees. No deformity ,swelling or crepitus noted. No muscle wasting or atrophy.   Neurologic: Cranial nerves 2 to 12 intact. Power, tone ,sensation and reflexes normal throughout. No disturbance in gait. No tremor.  Skin: Right lower extremity wrapped in bandage, skin not visualized at visit, however , recent breakdown reported  Psych; Normal mood and affect. Judgement and concentration normal   Assessment & Plan:  Encounter for Papanicolaou smear of cervix Exam as documented and pap is sent  Essential hypertension Controlled, no change in medication DASH diet and commitment to daily physical activity for a minimum of 30 minutes discussed and encouraged, as a part of hypertension management. The importance of attaining a healthy weight is also discussed.  BP/Weight 03/17/2019 03/09/2019 12/29/2018 12/14/2018 11/29/2018 [id number]  Systolic BP 99991111 Q000111Q [id number] [id number] [id number]  Diastolic BP 80 78 77 90 82 95 91  Wt. (Lbs) 268 - 280 271 284 280 275  BMI 41.97 - 43.85 42.44 44.48 43.85 43.07       Morbid obesity (HCC) Excellent response to phentermine with appropriate weight loss, she will continue half phentermine daily  Patient re-educated about  the importance of commitment to a  minimum of 150 minutes of exercise per week as able.  The importance of healthy food choices with portion control discussed, as well as eating regularly and within a 12 hour window most days. The need to choose "clean , green" food 50 to 75% of the time  is discussed, as well as to make water the primary drink and set a goal of 64 ounces water daily.    Weight /BMI 03/17/2019 12/29/2018 12/14/2018  WEIGHT 268 lb 280 lb 271 lb  HEIGHT 5\' 7"  5\' 7"  5\' 7"   BMI 41.97 kg/m2 43.85 kg/m2 42.44 kg/m2      Need for immunization against influenza After obtaining informed consent, the vaccine is  administered , with no adverse  effect noted at the time of administration.

## 2019-03-17 NOTE — Patient Instructions (Addendum)
F/U in 4  Months with MD in office , re evaluate weight   Flu vaccine today  Congratulations on excellent weight loss , continue half phentermine daily and healthy food choice  Betamethasone cream is sent to pharmacy for your rash and septra allergy entered in your record

## 2019-03-19 ENCOUNTER — Encounter: Payer: Self-pay | Admitting: Family Medicine

## 2019-03-19 DIAGNOSIS — Z124 Encounter for screening for malignant neoplasm of cervix: Secondary | ICD-10-CM | POA: Insufficient documentation

## 2019-03-19 NOTE — Assessment & Plan Note (Signed)
Controlled, no change in medication DASH diet and commitment to daily physical activity for a minimum of 30 minutes discussed and encouraged, as a part of hypertension management. The importance of attaining a healthy weight is also discussed.  BP/Weight 03/17/2019 03/09/2019 12/29/2018 12/14/2018 11/29/2018 [id number]  Systolic BP 99991111 Q000111Q 123456 [id number] 0000000 [id number] Q000111Q  Diastolic BP 80 78 77 90 82 95 91  Wt. (Lbs) 268 - 280 271 284 280 275  BMI 41.97 - 43.85 42.44 44.48 43.85 43.07

## 2019-03-19 NOTE — Assessment & Plan Note (Signed)
Excellent response to phentermine with appropriate weight loss, she will continue half phentermine daily  Patient re-educated about  the importance of commitment to a  minimum of 150 minutes of exercise per week as able.  The importance of healthy food choices with portion control discussed, as well as eating regularly and within a 12 hour window most days. The need to choose "clean , green" food 50 to 75% of the time is discussed, as well as to make water the primary drink and set a goal of 64 ounces water daily.    Weight /BMI 03/17/2019 12/29/2018 12/14/2018  WEIGHT 268 lb 280 lb 271 lb  HEIGHT 5\' 7"  5\' 7"  5\' 7"   BMI 41.97 kg/m2 43.85 kg/m2 42.44 kg/m2

## 2019-03-19 NOTE — Assessment & Plan Note (Signed)
Exam as documented and pap is sent

## 2019-03-19 NOTE — Assessment & Plan Note (Signed)
After obtaining informed consent, the vaccine is  administered , with no adverse effect noted at the time of administration.  

## 2019-03-22 DIAGNOSIS — L97211 Non-pressure chronic ulcer of right calf limited to breakdown of skin: Secondary | ICD-10-CM | POA: Diagnosis not present

## 2019-03-24 ENCOUNTER — Encounter: Payer: Self-pay | Admitting: Family Medicine

## 2019-03-24 ENCOUNTER — Encounter: Payer: Medicare Other | Admitting: Family Medicine

## 2019-03-24 LAB — CYTOLOGY - PAP
Comment: NEGATIVE
Diagnosis: NEGATIVE
High risk HPV: NEGATIVE

## 2019-03-29 DIAGNOSIS — L97211 Non-pressure chronic ulcer of right calf limited to breakdown of skin: Secondary | ICD-10-CM | POA: Diagnosis not present

## 2019-04-07 DIAGNOSIS — L97211 Non-pressure chronic ulcer of right calf limited to breakdown of skin: Secondary | ICD-10-CM | POA: Diagnosis not present

## 2019-04-08 ENCOUNTER — Telehealth: Payer: Self-pay

## 2019-04-08 NOTE — Telephone Encounter (Signed)
Spoke with patient and recommended Flonase to help dry up her drainage per Cherly Beach NP with verbal understanding.

## 2019-04-08 NOTE — Telephone Encounter (Signed)
Pt is calling started Wednesday with a Cough itchy throat, she can feel drainage going down her throat- her head is stopped up , pt wanted to talk to a nurse to see if she needs to be tested, as she was at the hosp with her mom

## 2019-04-11 ENCOUNTER — Other Ambulatory Visit: Payer: Self-pay | Admitting: Orthopaedic Surgery

## 2019-04-11 ENCOUNTER — Other Ambulatory Visit: Payer: Self-pay | Admitting: Family Medicine

## 2019-04-18 DIAGNOSIS — L97211 Non-pressure chronic ulcer of right calf limited to breakdown of skin: Secondary | ICD-10-CM | POA: Diagnosis not present

## 2019-04-18 NOTE — Telephone Encounter (Signed)
Rx request 

## 2019-04-19 DIAGNOSIS — M25561 Pain in right knee: Secondary | ICD-10-CM | POA: Diagnosis not present

## 2019-04-19 DIAGNOSIS — M170 Bilateral primary osteoarthritis of knee: Secondary | ICD-10-CM | POA: Diagnosis not present

## 2019-04-19 DIAGNOSIS — M25562 Pain in left knee: Secondary | ICD-10-CM | POA: Diagnosis not present

## 2019-04-26 DIAGNOSIS — M170 Bilateral primary osteoarthritis of knee: Secondary | ICD-10-CM | POA: Diagnosis not present

## 2019-04-26 DIAGNOSIS — L97211 Non-pressure chronic ulcer of right calf limited to breakdown of skin: Secondary | ICD-10-CM | POA: Diagnosis not present

## 2019-04-26 DIAGNOSIS — M25562 Pain in left knee: Secondary | ICD-10-CM | POA: Diagnosis not present

## 2019-04-26 DIAGNOSIS — M25561 Pain in right knee: Secondary | ICD-10-CM | POA: Diagnosis not present

## 2019-05-03 DIAGNOSIS — M170 Bilateral primary osteoarthritis of knee: Secondary | ICD-10-CM | POA: Diagnosis not present

## 2019-05-03 DIAGNOSIS — M25562 Pain in left knee: Secondary | ICD-10-CM | POA: Diagnosis not present

## 2019-05-03 DIAGNOSIS — M25561 Pain in right knee: Secondary | ICD-10-CM | POA: Diagnosis not present

## 2019-05-24 DIAGNOSIS — L97211 Non-pressure chronic ulcer of right calf limited to breakdown of skin: Secondary | ICD-10-CM | POA: Diagnosis not present

## 2019-06-01 ENCOUNTER — Other Ambulatory Visit: Payer: Self-pay | Admitting: Family Medicine

## 2019-06-16 DIAGNOSIS — L97519 Non-pressure chronic ulcer of other part of right foot with unspecified severity: Secondary | ICD-10-CM | POA: Diagnosis not present

## 2019-07-19 ENCOUNTER — Ambulatory Visit: Payer: Medicare Other | Admitting: Family Medicine

## 2019-08-04 ENCOUNTER — Encounter (INDEPENDENT_AMBULATORY_CARE_PROVIDER_SITE_OTHER): Payer: Self-pay | Admitting: *Deleted

## 2019-08-04 ENCOUNTER — Other Ambulatory Visit: Payer: Self-pay | Admitting: *Deleted

## 2019-08-04 ENCOUNTER — Encounter: Payer: Self-pay | Admitting: Family Medicine

## 2019-08-04 ENCOUNTER — Ambulatory Visit (INDEPENDENT_AMBULATORY_CARE_PROVIDER_SITE_OTHER): Payer: Medicare Other | Admitting: Family Medicine

## 2019-08-04 ENCOUNTER — Other Ambulatory Visit: Payer: Self-pay

## 2019-08-04 VITALS — BP 132/86 | HR 102 | Temp 98.9°F | Ht 67.0 in | Wt 258.8 lb

## 2019-08-04 DIAGNOSIS — M545 Low back pain: Secondary | ICD-10-CM | POA: Diagnosis not present

## 2019-08-04 DIAGNOSIS — L97329 Non-pressure chronic ulcer of left ankle with unspecified severity: Secondary | ICD-10-CM | POA: Diagnosis not present

## 2019-08-04 DIAGNOSIS — G8929 Other chronic pain: Secondary | ICD-10-CM | POA: Diagnosis not present

## 2019-08-04 DIAGNOSIS — Z1211 Encounter for screening for malignant neoplasm of colon: Secondary | ICD-10-CM

## 2019-08-04 DIAGNOSIS — I83023 Varicose veins of left lower extremity with ulcer of ankle: Secondary | ICD-10-CM

## 2019-08-04 DIAGNOSIS — I10 Essential (primary) hypertension: Secondary | ICD-10-CM | POA: Diagnosis not present

## 2019-08-04 MED ORDER — LISINOPRIL-HYDROCHLOROTHIAZIDE 20-25 MG PO TABS: ORAL_TABLET | ORAL | 0 refills | Status: DC

## 2019-08-04 MED ORDER — NAPROXEN 500 MG PO TABS: 500.0000 mg | ORAL_TABLET | Freq: Two times a day (BID) | ORAL | 0 refills | Status: DC

## 2019-08-04 MED ORDER — FLUCONAZOLE 150 MG PO TABS: 150.0000 mg | ORAL_TABLET | Freq: Once | ORAL | 0 refills | Status: AC

## 2019-08-04 MED ORDER — DOXYCYCLINE HYCLATE 100 MG PO TABS: 100.0000 mg | ORAL_TABLET | Freq: Two times a day (BID) | ORAL | 0 refills | Status: DC

## 2019-08-04 MED ORDER — CYCLOBENZAPRINE HCL 10 MG PO TABS: ORAL_TABLET | ORAL | 1 refills | Status: DC

## 2019-08-04 MED ORDER — PHENTERMINE HCL 37.5 MG PO TABS: ORAL_TABLET | ORAL | 1 refills | Status: DC

## 2019-08-04 MED ORDER — NAPROXEN 500 MG PO TABS: 500.0000 mg | ORAL_TABLET | Freq: Two times a day (BID) | ORAL | 1 refills | Status: DC

## 2019-08-04 NOTE — Patient Instructions (Addendum)
F/u in office in 5 months, call if you need me sooner  Antibiotic is prescribed for left leg ulcer and I recommend  you also see vascular Specialist to monitor progress/ healing   I have referred you to dr Laural Golden for colonoscopy in the Summer  Congrats on excellent weight loss , continue same  Anti inflammatory and muscle relaxant are prescribed   It is important that you exercise regularly at least 30 minutes 5 times a week. If you develop chest pain, have severe difficulty breathing, or feel very tired, stop exercising immediately and seek medical attention  Think about what you will eat, plan ahead. Choose " clean, green, fresh or frozen" over canned, processed or packaged foods which are more sugary, salty and fatty. 70 to 75% of food eaten should be vegetables and fruit. Three meals at set times with snacks allowed between meals, but they must be fruit or vegetables. Aim to eat over a 12 hour period , example 7 am to 7 pm, and STOP after  your last meal of the day. Drink water,generally about 64 ounces per day, no other drink is as healthy. Fruit juice is best enjoyed in a healthy way, by EATING the fruit. Thanks for choosing [facility], we consider it a privelige to serve you.

## 2019-08-06 ENCOUNTER — Encounter: Payer: Self-pay | Admitting: Family Medicine

## 2019-08-06 DIAGNOSIS — M549 Dorsalgia, unspecified: Secondary | ICD-10-CM | POA: Insufficient documentation

## 2019-08-06 DIAGNOSIS — I83023 Varicose veins of left lower extremity with ulcer of ankle: Secondary | ICD-10-CM | POA: Insufficient documentation

## 2019-08-06 NOTE — Progress Notes (Signed)
   Suzanne Andrews     MRN: [id number]      DOB: 08-14-1965   HPI Suzanne Andrews is here for follow up and re-evaluation of chronic medical conditions, medication management and review of any available recent lab and radiology data.  Preventive health is updated, specifically  Cancer screening and Immunization.   Questions or concerns regarding consultations or procedures which the PT has had in the interim are  addressed. The PT denies any adverse reactions to current medications since the last visit.  Great response to  phentemrine due to change in diet , and eating habit , wants to continue 3 week h/o right leg wound, no current drainage but not closing with topical antibiotic  ROS Denies recent fever or chills. Denies sinus pressure, nasal congestion, ear pain or sore throat. Denies chest congestion, productive cough or wheezing. Denies chest pains, palpitations c/o chronic bilateral leg swelling Denies abdominal pain, nausea, vomiting,diarrhea or constipation.   Denies dysuria, frequency, hesitancy or incontinence.  Denies headaches, seizures, numbness, or tingling.  PE  BP 132/86 (BP Location: Left Arm, Patient Position: Sitting)   Pulse (!) 102   Temp 98.9 F (37.2 C) (Temporal)   Ht 5\' 7"  (1.702 m)   Wt 258 lb 12.8 oz (117.4 kg)   SpO2 98%   BMI 40.53 kg/m   Patient alert and oriented and in no cardiopulmonary distress.  HEENT: No facial asymmetry, EOMI,     Neck supple .  Chest: Clear to auscultation bilaterally.  CVS: S1, S2 no murmurs, no S3.Regular rate.  ABD: Soft non tender.   Ext: bilateral  Edema right worse than left   MS: decreased  ROM lumbar spine, adequate in shoulders, hips   Skin: left leg u;lcer , max diameter approx 4 cm, surrounding skin significant for hyperpigmentation and erythema  Psych: Good eye contact, tearful, mourning recent close family losses CNS: CN 2-12 intact, power,  normal throughout.no focal deficits noted.   Assessment &  Plan  Stasis ulcer of ankle, left (HCC) Antibiotic prescribed and referred to vascular  Morbid obesity [facility])  Patient re-educated about  the importance of commitment to a  minimum of 150 minutes of exercise per week as able.  The importance of healthy food choices with portion control discussed, as well as eating regularly and within a 12 hour window most days. The need to choose "clean , green" food 50 to 75% of the time is discussed, as well as to make water the primary drink and set a goal of 64 ounces water daily.    Weight /BMI 08/04/2019 03/17/2019 12/29/2018  WEIGHT 258 lb 12.8 oz 268 lb 280 lb  HEIGHT 5\' 7"  5\' 7"  5\' 7"   BMI 40.53 kg/m2 41.97 kg/m2 43.85 kg/m2      Essential hypertension Controlled, no change in medication DASH diet and commitment to daily physical activity for a minimum of 30 minutes discussed and encouraged, as a part of hypertension management. The importance of attaining a healthy weight is also discussed.  BP/Weight 08/04/2019 03/17/2019 03/09/2019 12/29/2018 12/14/2018 11/29/2018 Q000111Q  Systolic BP Q000111Q [id number]  Diastolic BP 86 80 78 77 90 82 95  Wt. (Lbs) 258.8 268 - 280 271 284 280  BMI 40.53 41.97 - 43.85 42.44 44.48 43.85       Back pain Back pain and spasm, anti inflammatory and muscle relaxant prescribed

## 2019-08-06 NOTE — Assessment & Plan Note (Signed)
  Patient re-educated about  the importance of commitment to a  minimum of 150 minutes of exercise per week as able.  The importance of healthy food choices with portion control discussed, as well as eating regularly and within a 12 hour window most days. The need to choose "clean , green" food 50 to 75% of the time is discussed, as well as to make water the primary drink and set a goal of 64 ounces water daily.    Weight /BMI 08/04/2019 03/17/2019 12/29/2018  WEIGHT 258 lb 12.8 oz 268 lb 280 lb  HEIGHT 5\' 7"  5\' 7"  5\' 7"   BMI 40.53 kg/m2 41.97 kg/m2 43.85 kg/m2

## 2019-08-06 NOTE — Assessment & Plan Note (Signed)
Controlled, no change in medication DASH diet and commitment to daily physical activity for a minimum of 30 minutes discussed and encouraged, as a part of hypertension management. The importance of attaining a healthy weight is also discussed.  BP/Weight 08/04/2019 03/17/2019 03/09/2019 12/29/2018 12/14/2018 11/29/2018 Q000111Q  Systolic BP Q000111Q [id number] [id number]  Diastolic BP 86 80 78 77 90 82 95  Wt. (Lbs) 258.8 268 - 280 271 284 280  BMI 40.53 41.97 - 43.85 42.44 44.48 43.85

## 2019-08-06 NOTE — Assessment & Plan Note (Signed)
Antibiotic prescribed and referred to vascular

## 2019-08-06 NOTE — Assessment & Plan Note (Signed)
Back pain and spasm, anti inflammatory and muscle relaxant prescribed

## 2019-08-24 ENCOUNTER — Telehealth: Payer: Self-pay | Admitting: *Deleted

## 2019-08-24 NOTE — Telephone Encounter (Signed)
Hank you, I am aware

## 2019-08-24 NOTE — Telephone Encounter (Signed)
Pt stated that she wanted to go back to the wound center in [place] instead of vascular as she had went there before and spoke with them they told her they had other options they could try. I sent the referral over to wound center she just wanted Dr Moshe Cipro to be aware.

## 2019-08-25 ENCOUNTER — Ambulatory Visit: Payer: Medicare Other

## 2019-08-26 DIAGNOSIS — I890 Lymphedema, not elsewhere classified: Secondary | ICD-10-CM | POA: Diagnosis not present

## 2019-08-26 DIAGNOSIS — Z882 Allergy status to sulfonamides status: Secondary | ICD-10-CM | POA: Diagnosis not present

## 2019-08-26 DIAGNOSIS — I872 Venous insufficiency (chronic) (peripheral): Secondary | ICD-10-CM | POA: Diagnosis not present

## 2019-08-26 DIAGNOSIS — Z79899 Other long term (current) drug therapy: Secondary | ICD-10-CM | POA: Diagnosis not present

## 2019-08-26 DIAGNOSIS — I10 Essential (primary) hypertension: Secondary | ICD-10-CM | POA: Diagnosis not present

## 2019-09-09 DIAGNOSIS — M7122 Synovial cyst of popliteal space [Baker], left knee: Secondary | ICD-10-CM | POA: Diagnosis not present

## 2019-09-09 DIAGNOSIS — M7989 Other specified soft tissue disorders: Secondary | ICD-10-CM | POA: Diagnosis not present

## 2019-09-09 DIAGNOSIS — R600 Localized edema: Secondary | ICD-10-CM | POA: Diagnosis not present

## 2019-09-09 DIAGNOSIS — I878 Other specified disorders of veins: Secondary | ICD-10-CM | POA: Diagnosis not present

## 2019-09-09 DIAGNOSIS — I890 Lymphedema, not elsewhere classified: Secondary | ICD-10-CM | POA: Diagnosis not present

## 2019-09-09 DIAGNOSIS — M79605 Pain in left leg: Secondary | ICD-10-CM | POA: Diagnosis not present

## 2019-09-09 DIAGNOSIS — M79604 Pain in right leg: Secondary | ICD-10-CM | POA: Diagnosis not present

## 2019-09-13 DIAGNOSIS — I890 Lymphedema, not elsewhere classified: Secondary | ICD-10-CM | POA: Diagnosis not present

## 2019-09-13 DIAGNOSIS — L97919 Non-pressure chronic ulcer of unspecified part of right lower leg with unspecified severity: Secondary | ICD-10-CM | POA: Diagnosis not present

## 2019-09-13 DIAGNOSIS — I872 Venous insufficiency (chronic) (peripheral): Secondary | ICD-10-CM | POA: Diagnosis not present

## 2019-09-13 DIAGNOSIS — L97929 Non-pressure chronic ulcer of unspecified part of left lower leg with unspecified severity: Secondary | ICD-10-CM | POA: Diagnosis not present

## 2019-09-15 DIAGNOSIS — Z23 Encounter for immunization: Secondary | ICD-10-CM | POA: Diagnosis not present

## 2019-09-19 ENCOUNTER — Telehealth (INDEPENDENT_AMBULATORY_CARE_PROVIDER_SITE_OTHER): Payer: Medicare Other

## 2019-09-19 ENCOUNTER — Other Ambulatory Visit: Payer: Self-pay

## 2019-09-19 VITALS — BP 132/86 | Ht 67.0 in | Wt 258.0 lb

## 2019-09-19 DIAGNOSIS — Z0000 Encounter for general adult medical examination without abnormal findings: Secondary | ICD-10-CM

## 2019-09-19 MED ORDER — LISINOPRIL-HYDROCHLOROTHIAZIDE 20-25 MG PO TABS: ORAL_TABLET | ORAL | 5 refills | Status: DC

## 2019-09-19 NOTE — Patient Instructions (Signed)
Suzanne Andrews , Thank you for taking time to come for your Medicare Wellness Visit. I appreciate your ongoing commitment to your health goals. Please review the following plan we discussed and let me know if I can assist you in the future.   Screening recommendations/referrals: Colonoscopy: need to call and schedule  Mammogram: due end of July 2021 Bone Density: N/A Recommended yearly ophthalmology/optometry visit for glaucoma screening and checkup Recommended yearly dental visit for hygiene and checkup  Vaccinations: Influenza vaccine: up to date  Pneumococcal vaccine:  Tdap vaccine: due- check with pharmacy to see if covered  Shingles vaccine: ask insurance if covered    Advanced directives: mailed form   Conditions/risks identified: none  Next appointment: 1 year   Preventive Care 40-64 Years, Female Preventive care refers to lifestyle choices and visits with your health care provider that can promote health and wellness. What does preventive care include?  A yearly physical exam. This is also called an annual well check.  Dental exams once or twice a year.  Routine eye exams. Ask your health care provider how often you should have your eyes checked.  Personal lifestyle choices, including:  Daily care of your teeth and gums.  Regular physical activity.  Eating a healthy diet.  Avoiding tobacco and drug use.  Limiting alcohol use.  Practicing safe sex.  Taking low-dose aspirin daily starting at age 79.  Taking vitamin and mineral supplements as recommended by your health care provider. What happens during an annual well check? The services and screenings done by your health care provider during your annual well check will depend on your age, overall health, lifestyle risk factors, and family history of disease. Counseling  Your health care provider may ask you questions about your:  Alcohol use.  Tobacco use.  Drug use.  Emotional well-being.  Home and  relationship well-being.  Sexual activity.  Eating habits.  Work and work Statistician.  Method of birth control.  Menstrual cycle.  Pregnancy history. Screening  You may have the following tests or measurements:  Height, weight, and BMI.  Blood pressure.  Lipid and cholesterol levels. These may be checked every 5 years, or more frequently if you are over 57 years old.  Skin check.  Lung cancer screening. You may have this screening every year starting at age 21 if you have a 30-pack-year history of smoking and currently smoke or have quit within the past 15 years.  Fecal occult blood test (FOBT) of the stool. You may have this test every year starting at age 26.  Flexible sigmoidoscopy or colonoscopy. You may have a sigmoidoscopy every 5 years or a colonoscopy every 10 years starting at age 37.  Hepatitis C blood test.  Hepatitis B blood test.  Sexually transmitted disease (STD) testing.  Diabetes screening. This is done by checking your blood sugar (glucose) after you have not eaten for a while (fasting). You may have this done every 1-3 years.  Mammogram. This may be done every 1-2 years. Talk to your health care provider about when you should start having regular mammograms. This may depend on whether you have a family history of breast cancer.  BRCA-related cancer screening. This may be done if you have a family history of breast, ovarian, tubal, or peritoneal cancers.  Pelvic exam and Pap test. This may be done every 3 years starting at age 81. Starting at age 68, this may be done every 5 years if you have a Pap test in combination with  an HPV test.  Bone density scan. This is done to screen for osteoporosis. You may have this scan if you are at high risk for osteoporosis. Discuss your test results, treatment options, and if necessary, the need for more tests with your health care provider. Vaccines  Your health care provider may recommend certain vaccines, such  as:  Influenza vaccine. This is recommended every year.  Tetanus, diphtheria, and acellular pertussis (Tdap, Td) vaccine. You may need a Td booster every 10 years.  Zoster vaccine. You may need this after age 80.  Pneumococcal 13-valent conjugate (PCV13) vaccine. You may need this if you have certain conditions and were not previously vaccinated.  Pneumococcal polysaccharide (PPSV23) vaccine. You may need one or two doses if you smoke cigarettes or if you have certain conditions. Talk to your health care provider about which screenings and vaccines you need and how often you need them. This information is not intended to replace advice given to you by your health care provider. Make sure you discuss any questions you have with your health care provider. Document Released: 06/15/2015 Document Revised: 02/06/2016 Document Reviewed: 03/20/2015 Elsevier Interactive Patient Education  2017 [place] Prevention in the Home Falls can cause injuries. They can happen to people of all ages. There are many things you can do to make your home safe and to help prevent falls. What can I do on the outside of my home?  Regularly fix the edges of walkways and driveways and fix any cracks.  Remove anything that might make you trip as you walk through a door, such as a raised step or threshold.  Trim any bushes or trees on the path to your home.  Use bright outdoor lighting.  Clear any walking paths of anything that might make someone trip, such as rocks or tools.  Regularly check to see if handrails are loose or broken. Make sure that both sides of any steps have handrails.  Any raised decks and porches should have guardrails on the edges.  Have any leaves, snow, or ice cleared regularly.  Use sand or salt on walking paths during winter.  Clean up any spills in your garage right away. This includes oil or grease spills. What can I do in the bathroom?  Use night  lights.  Install grab bars by the toilet and in the tub and shower. Do not use towel bars as grab bars.  Use non-skid mats or decals in the tub or shower.  If you need to sit down in the shower, use a plastic, non-slip stool.  Keep the floor dry. Clean up any water that spills on the floor as soon as it happens.  Remove soap buildup in the tub or shower regularly.  Attach bath mats securely with double-sided non-slip rug tape.  Do not have throw rugs and other things on the floor that can make you trip. What can I do in the bedroom?  Use night lights.  Make sure that you have a light by your bed that is easy to reach.  Do not use any sheets or blankets that are too big for your bed. They should not hang down onto the floor.  Have a firm chair that has side arms. You can use this for support while you get dressed.  Do not have throw rugs and other things on the floor that can make you trip. What can I do in the kitchen?  Clean up any spills right away.  Avoid walking on wet floors.  Keep items that you use a lot in easy-to-reach places.  If you need to reach something above you, use a strong step stool that has a grab bar.  Keep electrical cords out of the way.  Do not use floor polish or wax that makes floors slippery. If you must use wax, use non-skid floor wax.  Do not have throw rugs and other things on the floor that can make you trip. What can I do with my stairs?  Do not leave any items on the stairs.  Make sure that there are handrails on both sides of the stairs and use them. Fix handrails that are broken or loose. Make sure that handrails are as long as the stairways.  Check any carpeting to make sure that it is firmly attached to the stairs. Fix any carpet that is loose or worn.  Avoid having throw rugs at the top or bottom of the stairs. If you do have throw rugs, attach them to the floor with carpet tape.  Make sure that you have a light switch at the  top of the stairs and the bottom of the stairs. If you do not have them, ask someone to add them for you. What else can I do to help prevent falls?  Wear shoes that:  Do not have high heels.  Have rubber bottoms.  Are comfortable and fit you well.  Are closed at the toe. Do not wear sandals.  If you use a stepladder:  Make sure that it is fully opened. Do not climb a closed stepladder.  Make sure that both sides of the stepladder are locked into place.  Ask someone to hold it for you, if possible.  Clearly mark and make sure that you can see:  Any grab bars or handrails.  First and last steps.  Where the edge of each step is.  Use tools that help you move around (mobility aids) if they are needed. These include:  Canes.  Walkers.  Scooters.  Crutches.  Turn on the lights when you go into a dark area. Replace any light bulbs as soon as they burn out.  Set up your furniture so you have a clear path. Avoid moving your furniture around.  If any of your floors are uneven, fix them.  If there are any pets around you, be aware of where they are.  Review your medicines with your doctor. Some medicines can make you feel dizzy. This can increase your chance of falling. Ask your doctor what other things that you can do to help prevent falls. This information is not intended to replace advice given to you by your health care provider. Make sure you discuss any questions you have with your health care provider. Document Released: 03/15/2009 Document Revised: 10/25/2015 Document Reviewed: 06/23/2014 Elsevier Interactive Patient Education  2017 [employer].

## 2019-09-19 NOTE — Progress Notes (Signed)
Subjective:   Suzanne Andrews is a 54 y.o. female who presents for Medicare Annual (Subsequent) preventive examination.  Review of Systems:   Cardiac Risk Factors include: hypertension;obesity (BMI >30kg/m2);sedentary lifestyle     Objective:     Vitals: BP 132/86   Ht 5\' 7"  (1.702 m)   Wt 258 lb (117 kg)   BMI 40.41 kg/m   Body mass index is 40.41 kg/m.  Advanced Directives 09/19/2019 03/09/2019 05/14/2017 07/12/2014  Does Patient Have a Medical Advance Directive? No No No No  Would patient like information on creating a medical advance directive? Yes (ED - Information included in AVS) No - Patient declined No - Patient declined No - patient declined information    Tobacco Social History   Tobacco Use  Smoking Status Never Smoker  Smokeless Tobacco Never Used     Counseling given: Not Answered   Clinical Intake:     Pain Score: 0-No pain        How often do you need to have someone help you when you read instructions, pamphlets, or other written materials from your doctor or pharmacy?: 1 - Never  Interpreter Needed?: No     Past Medical History:  Diagnosis Date  . Hypertension   . PVD (peripheral vascular disease) ([place])    Past Surgical History:  Procedure Laterality Date  . CESAREAN SECTION     x 2  . CHOLECYSTECTOMY    . KNEE ARTHROSCOPY WITH MEDIAL MENISECTOMY Left 07/18/2014   Procedure: LEFT KNEE ARTHROSCOPY WITH MEDIAL AND LATERAL MENISECTOMY;  Surgeon: Sanjuana Kava, MD;  Location: [facility];  Service: Orthopedics;  Laterality: Left;  . TUBAL LIGATION    . vascular surgery bilateral leg     Family History  Problem Relation Age of Onset  . Hypertension Mother   . Cancer Father   . Hypertension Sister   . Cancer Sister        breast  . Cancer Brother   . Hypertension Brother   . Cancer Brother    Social History   Socioeconomic History  . Marital status: Divorced    Spouse name: Not on file  . Number of children: Not on file  . Years  of education: Not on file  . Highest education level: Not on file  Occupational History  . Not on file  Tobacco Use  . Smoking status: Never Smoker  . Smokeless tobacco: Never Used  Substance and Sexual Activity  . Alcohol use: No  . Drug use: No  . Sexual activity: Yes    Birth control/protection: Surgical  Other Topics Concern  . Not on file  Social History Narrative  . Not on file   Social Determinants of Health   Financial Resource Strain: Low Risk   . Difficulty of Paying Living Expenses: Not hard at all  Food Insecurity:   . Worried About Charity fundraiser in the Last Year:   . Arboriculturist in the Last Year:   Transportation Needs: No Transportation Needs  . Lack of Transportation (Medical): No  . Lack of Transportation (Non-Medical): No  Physical Activity: Insufficiently Active  . Days of Exercise per Week: 3 days  . Minutes of Exercise per Session: 20 min  Stress:   . Feeling of Stress :   Social Connections: Somewhat Isolated  . Frequency of Communication with Friends and Family: More than three times a week  . Frequency of Social Gatherings with Friends and Family: Never  .  Attends Religious Services: Never  . Active Member of Clubs or Organizations: Yes  . Attends Archivist Meetings: 1 to 4 times per year  . Marital Status: Divorced    Outpatient Encounter Medications as of 09/19/2019  Medication Sig  . betamethasone dipropionate 0.05 % cream Apply sparingly two times daily to rash for 10 days, then as needed  . cyclobenzaprine (FLEXERIL) 10 MG tablet Take one tablet at bedtime , as needed, for muscle spasm  . doxycycline (VIBRA-TABS) 100 MG tablet Take 1 tablet (100 mg total) by mouth 2 (two) times daily.  Marland Kitchen lisinopril-hydrochlorothiazide (ZESTORETIC) 20-25 MG tablet TAKE 1 TABLET BY MOUTH ONCE DAILY . APPOINTMENT REQUIRED FOR FUTURE REFILLS  . loratadine (CLARITIN) 10 MG tablet Take 10 mg by mouth daily as needed for allergies.   . naproxen  (NAPROSYN) 500 MG tablet Take 1 tablet (500 mg total) by mouth 2 (two) times daily with a meal.  . pentoxifylline (TRENTAL) 400 MG CR tablet Take 400 mg by mouth 3 (three) times daily.  . phentermine (ADIPEX-P) 37.5 MG tablet Take half tablet by mouth once daily , every morning at breakfast  . VITAMIN D PO Take 1 capsule by mouth daily.  . [DISCONTINUED] lisinopril-hydrochlorothiazide (ZESTORETIC) 20-25 MG tablet TAKE 1 TABLET BY MOUTH ONCE DAILY . APPOINTMENT REQUIRED FOR FUTURE REFILLS   No facility-administered encounter medications on file as of 09/19/2019.    Activities of Daily Living In your present state of health, do you have any difficulty performing the following activities: 09/19/2019  Hearing? N  Vision? N  Difficulty concentrating or making decisions? N  Walking or climbing stairs? Y  Dressing or bathing? N  Doing errands, shopping? N  Preparing Food and eating ? N  Using the Toilet? N  In the past six months, have you accidently leaked urine? N  Managing your Medications? N  Managing your Finances? N  Housekeeping or managing your Housekeeping? N  Some recent data might be hidden    Patient Care Team: Fayrene Helper, MD as PCP - General    Assessment:   This is a routine wellness examination for Suzanne Andrews.  Exercise Activities and Dietary recommendations Current Exercise Habits: Home exercise routine, Time (Minutes): 15, Frequency (Times/Week): 3, Weekly Exercise (Minutes/Week): 45, Intensity: Mild, Exercise limited by: orthopedic condition(s)  Goals    . DIET - INCREASE WATER INTAKE    . Weight (lb) < 200 lb (90.7 kg)     Lose 20 lbs        Fall Risk Fall Risk  09/19/2019 03/17/2019 11/29/2018  Falls in the past year? 0 0 0  Number falls in past yr: 0 0 -  Injury with Fall? 0 0 0   Is the patient's home free of loose throw rugs in walkways, pet beds, electrical cords, etc?   yes      Grab bars in the bathroom? yes      Handrails on the stairs?   yes       Adequate lighting?   yes  Timed Get Up and Go performed: unable due to virtual visit  Depression Screen PHQ 2/9 Scores 09/19/2019 03/17/2019 11/29/2018 11/29/2018  PHQ - 2 Score 0 0 0 0  PHQ- 9 Score - - 2 -     Cognitive Function     6CIT Screen 09/19/2019  What Year? 0 points  What month? 0 points  What time? 0 points  Count back from 20 0 points  Months in reverse 0  points  Repeat phrase 0 points  Total Score 0    Immunization History  Administered Date(s) Administered  . Influenza Split 04/02/2011, 02/12/2012, 04/07/2018  . Influenza,inj,Quad PF,6+ Mos 02/23/2013, 08/22/2014, 02/28/2015, 04/08/2017, 03/17/2019  . Td 11/12/2007    Qualifies for Shingles Vaccine? Check with pharmacy   Screening Tests Health Maintenance  Topic Date Due  . COVID-19 Vaccine (1) Never done  . COLONOSCOPY  Never done  . TETANUS/TDAP  11/29/2019 (Originally 11/11/2017)  . INFLUENZA VACCINE  01/01/2020  . MAMMOGRAM  12/14/2020  . PAP SMEAR-Modifier  03/16/2022  . HIV Screening  Completed    Cancer Screenings: Lung: Low Dose CT Chest recommended if Age 73-80 years, 30 pack-year currently smoking OR have quit w/in 15years. Patient does not qualify. Breast:  Up to date on Mammogram? Yes   Up to date of Bone Density/Dexa? Yes Colorectal: has paper to call and schedule   Additional Screenings:: Hepatitis C Screening: needs to have done once     Plan:     I have personally reviewed and noted the following in the patient's chart:   . Medical and social history . Use of alcohol, tobacco or illicit drugs  . Current medications and supplements . Functional ability and status . Nutritional status . Physical activity . Advanced directives . List of other physicians . Hospitalizations, surgeries, and ER visits in previous 12 months . Vitals . Screenings to include cognitive, depression, and falls . Referrals and appointments  In addition, I have reviewed and discussed with patient  certain preventive protocols, quality metrics, and best practice recommendations. A written personalized care plan for preventive services as well as general preventive health recommendations were provided to patient.     Kate Sable, LPN, LPN  [id number]

## 2019-09-22 DIAGNOSIS — I872 Venous insufficiency (chronic) (peripheral): Secondary | ICD-10-CM | POA: Diagnosis not present

## 2019-09-30 DIAGNOSIS — L97929 Non-pressure chronic ulcer of unspecified part of left lower leg with unspecified severity: Secondary | ICD-10-CM | POA: Diagnosis not present

## 2019-09-30 DIAGNOSIS — L97919 Non-pressure chronic ulcer of unspecified part of right lower leg with unspecified severity: Secondary | ICD-10-CM | POA: Diagnosis not present

## 2019-09-30 DIAGNOSIS — I872 Venous insufficiency (chronic) (peripheral): Secondary | ICD-10-CM | POA: Diagnosis not present

## 2019-10-06 DIAGNOSIS — I872 Venous insufficiency (chronic) (peripheral): Secondary | ICD-10-CM | POA: Diagnosis not present

## 2019-10-11 NOTE — Telephone Encounter (Signed)
Error

## 2019-10-13 DIAGNOSIS — Z23 Encounter for immunization: Secondary | ICD-10-CM | POA: Diagnosis not present

## 2019-10-14 DIAGNOSIS — I872 Venous insufficiency (chronic) (peripheral): Secondary | ICD-10-CM | POA: Diagnosis not present

## 2019-10-28 DIAGNOSIS — L97319 Non-pressure chronic ulcer of right ankle with unspecified severity: Secondary | ICD-10-CM | POA: Diagnosis not present

## 2019-10-28 DIAGNOSIS — I872 Venous insufficiency (chronic) (peripheral): Secondary | ICD-10-CM | POA: Diagnosis not present

## 2019-10-28 DIAGNOSIS — L97312 Non-pressure chronic ulcer of right ankle with fat layer exposed: Secondary | ICD-10-CM | POA: Diagnosis not present

## 2019-10-28 DIAGNOSIS — I83013 Varicose veins of right lower extremity with ulcer of ankle: Secondary | ICD-10-CM | POA: Diagnosis not present

## 2019-11-03 ENCOUNTER — Other Ambulatory Visit: Payer: Self-pay | Admitting: Family Medicine

## 2019-11-17 DIAGNOSIS — M170 Bilateral primary osteoarthritis of knee: Secondary | ICD-10-CM | POA: Diagnosis not present

## 2019-11-21 DIAGNOSIS — I872 Venous insufficiency (chronic) (peripheral): Secondary | ICD-10-CM | POA: Diagnosis not present

## 2019-11-21 DIAGNOSIS — E669 Obesity, unspecified: Secondary | ICD-10-CM | POA: Diagnosis not present

## 2019-11-21 DIAGNOSIS — E1159 Type 2 diabetes mellitus with other circulatory complications: Secondary | ICD-10-CM | POA: Diagnosis not present

## 2019-11-21 DIAGNOSIS — I10 Essential (primary) hypertension: Secondary | ICD-10-CM | POA: Diagnosis not present

## 2019-11-21 DIAGNOSIS — L97312 Non-pressure chronic ulcer of right ankle with fat layer exposed: Secondary | ICD-10-CM | POA: Diagnosis not present

## 2019-11-21 DIAGNOSIS — I83013 Varicose veins of right lower extremity with ulcer of ankle: Secondary | ICD-10-CM | POA: Diagnosis not present

## 2019-11-21 DIAGNOSIS — Z79899 Other long term (current) drug therapy: Secondary | ICD-10-CM | POA: Diagnosis not present

## 2019-11-29 DIAGNOSIS — E669 Obesity, unspecified: Secondary | ICD-10-CM | POA: Diagnosis not present

## 2019-11-29 DIAGNOSIS — I83013 Varicose veins of right lower extremity with ulcer of ankle: Secondary | ICD-10-CM | POA: Diagnosis not present

## 2019-11-29 DIAGNOSIS — I872 Venous insufficiency (chronic) (peripheral): Secondary | ICD-10-CM | POA: Diagnosis not present

## 2019-11-29 DIAGNOSIS — L97312 Non-pressure chronic ulcer of right ankle with fat layer exposed: Secondary | ICD-10-CM | POA: Diagnosis not present

## 2019-11-29 DIAGNOSIS — E1159 Type 2 diabetes mellitus with other circulatory complications: Secondary | ICD-10-CM | POA: Diagnosis not present

## 2019-11-29 DIAGNOSIS — I10 Essential (primary) hypertension: Secondary | ICD-10-CM | POA: Diagnosis not present

## 2019-12-06 ENCOUNTER — Other Ambulatory Visit: Payer: Self-pay | Admitting: Family Medicine

## 2020-01-04 ENCOUNTER — Other Ambulatory Visit: Payer: Self-pay | Admitting: Family Medicine

## 2020-01-04 ENCOUNTER — Ambulatory Visit (INDEPENDENT_AMBULATORY_CARE_PROVIDER_SITE_OTHER): Payer: Medicare Other | Admitting: Family Medicine

## 2020-01-04 ENCOUNTER — Encounter: Payer: Self-pay | Admitting: Family Medicine

## 2020-01-04 ENCOUNTER — Other Ambulatory Visit: Payer: Self-pay

## 2020-01-04 VITALS — BP 120/76 | HR 81 | Resp 16 | Ht 67.0 in | Wt 257.1 lb

## 2020-01-04 DIAGNOSIS — R7303 Prediabetes: Secondary | ICD-10-CM

## 2020-01-04 DIAGNOSIS — Z13220 Encounter for screening for lipoid disorders: Secondary | ICD-10-CM

## 2020-01-04 DIAGNOSIS — M1712 Unilateral primary osteoarthritis, left knee: Secondary | ICD-10-CM

## 2020-01-04 DIAGNOSIS — Z1159 Encounter for screening for other viral diseases: Secondary | ICD-10-CM

## 2020-01-04 DIAGNOSIS — E559 Vitamin D deficiency, unspecified: Secondary | ICD-10-CM

## 2020-01-04 DIAGNOSIS — R7301 Impaired fasting glucose: Secondary | ICD-10-CM

## 2020-01-04 DIAGNOSIS — Z1231 Encounter for screening mammogram for malignant neoplasm of breast: Secondary | ICD-10-CM

## 2020-01-04 DIAGNOSIS — I10 Essential (primary) hypertension: Secondary | ICD-10-CM | POA: Diagnosis not present

## 2020-01-04 MED ORDER — NAPROXEN 500 MG PO TABS: ORAL_TABLET | ORAL | 3 refills | Status: DC

## 2020-01-04 MED ORDER — PHENTERMINE HCL 37.5 MG PO TABS: ORAL_TABLET | ORAL | 1 refills | Status: DC

## 2020-01-04 NOTE — Patient Instructions (Addendum)
F/U office with MD in  4 month, in office re eval weight call if you need me sooner.  Aim to lose 4 pounds per minth  Please schedule mammogram before pt leaves  Please follow through on colonscopy  Labs today , cBC, lipid, cmp and EGFR, hBA1C , TSH. Vit D, and hepatitis C screen    Thankful legs have healed  Think about what you will eat, plan ahead. Choose " clean, green, fresh or frozen" over canned, processed or packaged foods which are more sugary, salty and fatty. 70 to 75% of food eaten should be vegetables and fruit. Three meals at set times with snacks allowed between meals, but they must be fruit or vegetables. Aim to eat over a 12 hour period , example 7 am to 7 pm, and STOP after  your last meal of the day. Drink water,generally about 64 ounces per day, no other drink is as healthy. Fruit juice is best enjoyed in a healthy way, by EATING the fruit.  It is important that you exercise regularly at least 30 minutes 5 times a week. If you develop chest pain, have severe difficulty breathing, or feel very tired, stop exercising immediately and seek medical attention  Thanks for choosing [facility], we consider it a privelige to serve you.

## 2020-01-04 NOTE — Progress Notes (Signed)
° °  Suzanne Andrews     MRN: [id number]      DOB: 1965-12-15   HPI Suzanne Andrews is here for follow up and re-evaluation of chronic medical conditions, medication management and review of any available recent lab and radiology data.  Preventive health is updated, specifically  Cancer screening and Immunization.   After 1 year the ulcers on both legs are healed Plans to pursue colonoscopy and also needs left knee replacement  Has changed diet with 20 pound weight loss  ROS Denies recent fever or chills. Denies sinus pressure, nasal congestion, ear pain or sore throat. Denies chest congestion, productive cough or wheezing. Denies chest pains, palpitations and leg swelling Denies abdominal pain, nausea, vomiting,diarrhea or constipation.   Denies dysuria, frequency, hesitancy or incontinence. . Denies headaches, seizures, numbness, or tingling. Denies depression, anxiety or insomnia. Denies skin break down or rash.   PE  BP 120/76    Pulse 81    Resp 16    Ht 5\' 7"  (1.702 m)    Wt 257 lb 1.9 oz (116.6 kg)    SpO2 96%    BMI 40.27 kg/m   Patient alert and oriented and in no cardiopulmonary distress.  HEENT: No facial asymmetry, EOMI,     Neck supple .  Chest: Clear to auscultation bilaterally.  CVS: S1, S2 no murmurs, no S3.Regular rate.  ABD: Soft non tender.   Ext: No edema  MS: Adequate ROM spine, shoulders, hips and reduced in left  knees.  Skin: Intact, no ulcerations or rash noted.  Psych: Good eye contact, normal affect. Memory intact not anxious or depressed appearing.  CNS: CN 2-12 intact, power,  normal throughout.no focal deficits noted.   Assessment & Plan  Essential hypertension Controlled, no change in medication DASH diet and commitment to daily physical activity for a minimum of 30 minutes discussed and encouraged, as a part of hypertension management. The importance of attaining a healthy weight is also discussed.  BP/Weight 01/04/2020 09/19/2019  08/04/2019 03/17/2019 03/09/2019 12/29/2018 09/15/6061  Systolic BP 016 010 932 355 732 202 542  Diastolic BP 76 86 86 80 78 77 90  Wt. (Lbs) 257.12 258 258.8 268 - 280 271  BMI 40.27 40.41 40.53 41.97 - 43.85 42.44       Morbid obesity (HCC) Improved  Patient re-educated about  the importance of commitment to a  minimum of 150 minutes of exercise per week as able.  The importance of healthy food choices with portion control discussed, as well as eating regularly and within a 12 hour window most days. The need to choose "clean , green" food 50 to 75% of the time is discussed, as well as to make water the primary drink and set a goal of 64 ounces water daily.    Weight /BMI 01/04/2020 09/19/2019 08/04/2019  WEIGHT 257 lb 1.9 oz 258 lb 258 lb 12.8 oz  HEIGHT 5\' 7"  5\' 7"  5\' 7"   BMI 40.27 kg/m2 40.41 kg/m2 40.53 kg/m2    Continue half phentermine daily, target weight loss of 4 pounds/ month  Osteoarthritis, knee Left knee pain, for upcoming surgery, anti inflammatory prescribed for as needed, judicious use

## 2020-01-04 NOTE — Assessment & Plan Note (Signed)
Controlled, no change in medication DASH diet and commitment to daily physical activity for a minimum of 30 minutes discussed and encouraged, as a part of hypertension management. The importance of attaining a healthy weight is also discussed.  BP/Weight 01/04/2020 09/19/2019 08/04/2019 03/17/2019 03/09/2019 12/29/2018 08/23/1989  Systolic BP 444 584 835 075 732 256 720  Diastolic BP 76 86 86 80 78 77 90  Wt. (Lbs) 257.12 258 258.8 268 - 280 271  BMI 40.27 40.41 40.53 41.97 - 43.85 42.44

## 2020-01-04 NOTE — Assessment & Plan Note (Signed)
Patient educated about the importance of limiting  Carbohydrate intake , the need to commit to daily physical activity for a minimum of 30 minutes , and to commit weight loss. The fact that changes in all these areas will reduce or eliminate all together the development of diabetes is stressed.  Updated lab needed at/ before next visit.  Diabetic Labs Latest Ref Rng & Units 12/24/2018 04/15/2017 05/15/2015 07/12/2014 07/15/2013  HbA1c 4.8 - 5.6 % 5.8(H) 5.5 - - 5.7(H)  Chol 100 - 199 mg/dL 180 - 171 - -  HDL >39 mg/dL 58 - 63 - -  Calc LDL 0 - 99 mg/dL 110(H) - 96 - -  Triglycerides 0 - 149 mg/dL 62 - 62 - -  Creatinine 0.57 - 1.00 mg/dL 0.77 0.89 0.72 0.69 0.70   BP/Weight 01/04/2020 09/19/2019 08/04/2019 03/17/2019 03/09/2019 12/29/2018 01/25/4157  Systolic BP 309 407 680 881 103 159 458  Diastolic BP 76 86 86 80 78 77 90  Wt. (Lbs) 257.12 258 258.8 268 - 280 271  BMI 40.27 40.41 40.53 41.97 - 43.85 42.44   No flowsheet data found.

## 2020-01-04 NOTE — Assessment & Plan Note (Signed)
Left knee pain, for upcoming surgery, anti inflammatory prescribed for as needed, judicious use

## 2020-01-04 NOTE — Assessment & Plan Note (Signed)
Improved  Patient re-educated about  the importance of commitment to a  minimum of 150 minutes of exercise per week as able.  The importance of healthy food choices with portion control discussed, as well as eating regularly and within a 12 hour window most days. The need to choose "clean , green" food 50 to 75% of the time is discussed, as well as to make water the primary drink and set a goal of 64 ounces water daily.    Weight /BMI 01/04/2020 09/19/2019 08/04/2019  WEIGHT 257 lb 1.9 oz 258 lb 258 lb 12.8 oz  HEIGHT 5\' 7"  5\' 7"  5\' 7"   BMI 40.27 kg/m2 40.41 kg/m2 40.53 kg/m2    Continue half phentermine daily, target weight loss of 4 pounds/ month

## 2020-01-05 LAB — CMP14+EGFR
ALT: 11 IU/L (ref 0–32)
AST: 16 IU/L (ref 0–40)
Albumin/Globulin Ratio: 1.3 (ref 1.2–2.2)
Albumin: 4.1 g/dL (ref 3.8–4.9)
Alkaline Phosphatase: 79 IU/L (ref 48–121)
BUN/Creatinine Ratio: 11 (ref 9–23)
BUN: 9 mg/dL (ref 6–24)
Bilirubin Total: 0.3 mg/dL (ref 0.0–1.2)
CO2: 27 mmol/L (ref 20–29)
Calcium: 10.1 mg/dL (ref 8.7–10.2)
Chloride: 102 mmol/L (ref 96–106)
Creatinine, Ser: 0.8 mg/dL (ref 0.57–1.00)
GFR calc Af Amer: 97 mL/min/{1.73_m2} (ref >59)
GFR calc non Af Amer: 84 mL/min/{1.73_m2} (ref >59)
Globulin, Total: 3.2 g/dL (ref 1.5–4.5)
Glucose: 79 mg/dL (ref 65–99)
Potassium: 4.2 mmol/L (ref 3.5–5.2)
Sodium: 141 mmol/L (ref 134–144)
Total Protein: 7.3 g/dL (ref 6.0–8.5)

## 2020-01-05 LAB — CBC
Hematocrit: 39.1 % (ref 34.0–46.6)
Hemoglobin: 12.8 g/dL (ref 11.1–15.9)
MCH: 30 pg (ref 26.6–33.0)
MCHC: 32.7 g/dL (ref 31.5–35.7)
MCV: 92 fL (ref 79–97)
Platelets: 281 10*3/uL (ref 150–450)
RBC: 4.27 x10E6/uL (ref 3.77–5.28)
RDW: 12.9 % (ref 11.7–15.4)
WBC: 4.4 10*3/uL (ref 3.4–10.8)

## 2020-01-05 LAB — TSH: TSH: 1.38 u[IU]/mL (ref 0.450–4.500)

## 2020-01-05 LAB — VITAMIN D 25 HYDROXY (VIT D DEFICIENCY, FRACTURES): Vit D, 25-Hydroxy: 41.3 ng/mL (ref 30.0–100.0)

## 2020-01-05 LAB — LIPID PANEL
Chol/HDL Ratio: 3.2 ratio (ref 0.0–4.4)
Cholesterol, Total: 171 mg/dL (ref 100–199)
HDL: 53 mg/dL (ref >39)
LDL Chol Calc (NIH): 106 mg/dL — ABNORMAL HIGH (ref 0–99)
Triglycerides: 64 mg/dL (ref 0–149)
VLDL Cholesterol Cal: 12 mg/dL (ref 5–40)

## 2020-01-05 LAB — HEPATITIS C ANTIBODY: Hep C Virus Ab: <0.1 s/co ratio (ref 0.0–0.9)

## 2020-01-05 LAB — HEMOGLOBIN A1C
Est. average glucose Bld gHb Est-mCnc: 111 mg/dL
Hgb A1c MFr Bld: 5.5 % (ref 4.8–5.6)

## 2020-01-30 ENCOUNTER — Telehealth: Payer: Self-pay | Admitting: Family Medicine

## 2020-01-30 NOTE — Telephone Encounter (Signed)
Patient was last seen 8/4 and called she is having a lot of congestion and pressure in her nose and throat wanted to know if we could recommend something for her to take she is already treating it with over the counter meds. [phone]

## 2020-01-30 NOTE — Telephone Encounter (Signed)
Patient will call back after she gets a Covid test and the results.

## 2020-01-30 NOTE — Telephone Encounter (Signed)
Returned patients call. No answer, left message with direct number to call back.

## 2020-01-31 DIAGNOSIS — Z1159 Encounter for screening for other viral diseases: Secondary | ICD-10-CM | POA: Diagnosis not present

## 2020-02-02 ENCOUNTER — Telehealth: Payer: Self-pay | Admitting: Family Medicine

## 2020-02-02 NOTE — Telephone Encounter (Signed)
noted 

## 2020-02-02 NOTE — Telephone Encounter (Signed)
FYI

## 2020-02-02 NOTE — Telephone Encounter (Signed)
Patient called to make [place] aware that her Covid test came back postive

## 2020-02-14 DIAGNOSIS — Z20828 Contact with and (suspected) exposure to other viral communicable diseases: Secondary | ICD-10-CM | POA: Diagnosis not present

## 2020-04-06 ENCOUNTER — Other Ambulatory Visit: Payer: Self-pay | Admitting: Family Medicine

## 2020-05-08 ENCOUNTER — Ambulatory Visit: Payer: Medicare Other | Admitting: Family Medicine

## 2020-05-16 ENCOUNTER — Other Ambulatory Visit (HOSPITAL_COMMUNITY): Payer: Self-pay | Admitting: Family Medicine

## 2020-05-16 DIAGNOSIS — Z1231 Encounter for screening mammogram for malignant neoplasm of breast: Secondary | ICD-10-CM

## 2020-05-16 DIAGNOSIS — Z23 Encounter for immunization: Secondary | ICD-10-CM | POA: Diagnosis not present

## 2020-05-30 ENCOUNTER — Ambulatory Visit (HOSPITAL_COMMUNITY)
Admission: RE | Admit: 2020-05-30 | Discharge: 2020-05-30 | Disposition: A | Discharge status: Home / Self Care | Payer: Medicare Other | Source: Ambulatory Visit | Attending: Family Medicine | Admitting: Family Medicine

## 2020-05-30 ENCOUNTER — Ambulatory Visit: Payer: Medicare Other | Admitting: Family Medicine

## 2020-05-30 ENCOUNTER — Other Ambulatory Visit: Payer: Self-pay

## 2020-05-30 DIAGNOSIS — Z1231 Encounter for screening mammogram for malignant neoplasm of breast: Secondary | ICD-10-CM | POA: Insufficient documentation

## 2020-05-31 ENCOUNTER — Other Ambulatory Visit (HOSPITAL_COMMUNITY): Payer: Self-pay | Admitting: Family Medicine

## 2020-05-31 DIAGNOSIS — R928 Other abnormal and inconclusive findings on diagnostic imaging of breast: Secondary | ICD-10-CM

## 2020-06-05 ENCOUNTER — Ambulatory Visit (HOSPITAL_COMMUNITY): Payer: Medicare Other

## 2020-06-05 ENCOUNTER — Ambulatory Visit (HOSPITAL_COMMUNITY): Admission: RE | Admit: 2020-06-05 | Payer: Medicare Other | Source: Ambulatory Visit

## 2020-06-12 ENCOUNTER — Ambulatory Visit (HOSPITAL_COMMUNITY)
Admission: RE | Admit: 2020-06-12 | Discharge: 2020-06-12 | Disposition: A | Discharge status: Home / Self Care | Payer: Medicare Other | Source: Ambulatory Visit | Attending: Family Medicine | Admitting: Family Medicine

## 2020-06-12 ENCOUNTER — Other Ambulatory Visit: Payer: Self-pay

## 2020-06-12 DIAGNOSIS — R928 Other abnormal and inconclusive findings on diagnostic imaging of breast: Secondary | ICD-10-CM

## 2020-06-12 DIAGNOSIS — R922 Inconclusive mammogram: Secondary | ICD-10-CM | POA: Diagnosis not present

## 2020-06-14 ENCOUNTER — Other Ambulatory Visit: Payer: Self-pay

## 2020-06-14 ENCOUNTER — Encounter: Payer: Self-pay | Admitting: Nurse Practitioner

## 2020-06-14 ENCOUNTER — Ambulatory Visit (INDEPENDENT_AMBULATORY_CARE_PROVIDER_SITE_OTHER): Payer: Medicare Other | Admitting: Nurse Practitioner

## 2020-06-14 DIAGNOSIS — I10 Essential (primary) hypertension: Secondary | ICD-10-CM

## 2020-06-14 DIAGNOSIS — M1712 Unilateral primary osteoarthritis, left knee: Secondary | ICD-10-CM

## 2020-06-14 DIAGNOSIS — Z139 Encounter for screening, unspecified: Secondary | ICD-10-CM

## 2020-06-14 MED ORDER — NAPROXEN 500 MG PO TABS: ORAL_TABLET | ORAL | 0 refills | Status: DC

## 2020-06-14 MED ORDER — PHENTERMINE HCL 37.5 MG PO TABS: ORAL_TABLET | ORAL | 1 refills | Status: DC

## 2020-06-14 MED ORDER — LISINOPRIL-HYDROCHLOROTHIAZIDE 20-25 MG PO TABS: ORAL_TABLET | ORAL | 1 refills | Status: DC

## 2020-06-14 NOTE — Addendum Note (Signed)
Addended by: Demetrius Revel on: 06/14/2020 03:53 PM   Modules accepted: Orders

## 2020-06-14 NOTE — Assessment & Plan Note (Signed)
-  requesting refill on naproxen for PRN use -refilled today

## 2020-06-14 NOTE — Patient Instructions (Signed)
We administered the flu shot as well as the TDaP vaccine today.  We will see you back in 1 month for a medication check for phentermine.

## 2020-06-14 NOTE — Progress Notes (Addendum)
Acute Office Visit  Subjective:    Patient ID: Suzanne Andrews, female    DOB: [birth year], 55 y.o.   MRN: [id number]  Chief Complaint  Patient presents with  . Health Maintenance  . Follow-up  . Hypertension    HPI Patient is in today for preventative maintenance.  She wanted to make sure she was up to date on vaccinations. She also wants refills on her BP meds, naproxen, and phentermine.  Past Medical History:  Diagnosis Date  . Hypertension   . PVD (peripheral vascular disease) ([place])     Past Surgical History:  Procedure Laterality Date  . CESAREAN SECTION     x 2  . CHOLECYSTECTOMY    . KNEE ARTHROSCOPY WITH MEDIAL MENISECTOMY Left 07/18/2014   Procedure: LEFT KNEE ARTHROSCOPY WITH MEDIAL AND LATERAL MENISECTOMY;  Surgeon: Sanjuana Kava, MD;  Location: [facility];  Service: Orthopedics;  Laterality: Left;  . TUBAL LIGATION    . vascular surgery bilateral leg      Family History  Problem Relation Age of Onset  . Hypertension Mother   . Cancer Father   . Hypertension Sister   . Cancer Sister        breast  . Cancer Brother   . Hypertension Brother   . Cancer Brother     Social History   Socioeconomic History  . Marital status: Divorced    Spouse name: Not on file  . Number of children: Not on file  . Years of education: Not on file  . Highest education level: Not on file  Occupational History  . Not on file  Tobacco Use  . Smoking status: Never Smoker  . Smokeless tobacco: Never Used  Substance and Sexual Activity  . Alcohol use: No  . Drug use: No  . Sexual activity: Yes    Birth control/protection: Surgical  Other Topics Concern  . Not on file  Social History Narrative  . Not on file   Social Determinants of Health   Financial Resource Strain: Low Risk   . Difficulty of Paying Living Expenses: Not hard at all  Food Insecurity: Not on file  Transportation Needs: No Transportation Needs  . Lack of Transportation (Medical): No  . Lack of  Transportation (Non-Medical): No  Physical Activity: Insufficiently Active  . Days of Exercise per Week: 3 days  . Minutes of Exercise per Session: 20 min  Stress: Not on file  Social Connections: Moderately Isolated  . Frequency of Communication with Friends and Family: More than three times a week  . Frequency of Social Gatherings with Friends and Family: Never  . Attends Religious Services: Never  . Active Member of Clubs or Organizations: Yes  . Attends Archivist Meetings: 1 to 4 times per year  . Marital Status: Divorced  Human resources officer Violence: Not on file    Outpatient Medications Prior to Visit  Medication Sig Dispense Refill  . betamethasone dipropionate 0.05 % cream Apply sparingly two times daily to rash for 10 days, then as needed 45 g 0  . cyclobenzaprine (FLEXERIL) 10 MG tablet Take one tablet at bedtime , as needed, for muscle spasm 30 tablet 1  . loratadine (CLARITIN) 10 MG tablet Take 10 mg by mouth daily as needed for allergies.    . pentoxifylline (TRENTAL) 400 MG CR tablet Take 400 mg by mouth 3 (three) times daily.    Marland Kitchen VITAMIN D PO Take 1 capsule by mouth daily.    Marland Kitchen lisinopril-hydrochlorothiazide (ZESTORETIC)  20-25 MG tablet TAKE 1 TABLET BY MOUTH ONCE DAILY . APPOINTMENT REQUIRED FOR FUTURE REFILLS 30 tablet 5  . naproxen (NAPROSYN) 500 MG tablet TAKE 1 TABLET BY MOUTH TWICE DAILY WITH A MEAL AS NEEDED 30 tablet 0  . phentermine (ADIPEX-P) 37.5 MG tablet Take half tablet by mouth once daily with breakfast 30 tablet 1   No facility-administered medications prior to visit.    Allergies  Allergen Reactions  . Septra [Sulfamethoxazole-Trimethoprim] Rash    Review of Systems  Constitutional: Negative.   Respiratory: Negative.   Cardiovascular: Negative.   Psychiatric/Behavioral: Negative.        Objective:    Physical Exam Constitutional:      Appearance: She is obese.  Cardiovascular:     Rate and Rhythm: Normal rate and regular  rhythm.     Pulses: Normal pulses.     Heart sounds: Normal heart sounds.  Pulmonary:     Effort: Pulmonary effort is normal.     Breath sounds: Normal breath sounds.  Neurological:     Mental Status: She is alert.  Psychiatric:        Mood and Affect: Mood normal.        Behavior: Behavior normal.        Thought Content: Thought content normal.        Judgment: Judgment normal.     BP 129/75   Pulse 80   Temp 98.4 F (36.9 C)   Resp 18   Ht 5\' 7"  (1.702 m)   Wt 263 lb (119.3 kg)   SpO2 97%   BMI 41.19 kg/m  Wt Readings from Last 3 Encounters:  06/14/20 263 lb (119.3 kg)  01/04/20 257 lb 1.9 oz (116.6 kg)  09/19/19 258 lb (117 kg)    Health Maintenance Due  Topic Date Due  . INFLUENZA VACCINE  01/01/2020    There are no preventive care reminders to display for this patient.   Lab Results  Component Value Date   TSH 1.380 01/04/2020   Lab Results  Component Value Date   WBC 4.4 01/04/2020   HGB 12.8 01/04/2020   HCT 39.1 01/04/2020   MCV 92 01/04/2020   PLT 281 01/04/2020   Lab Results  Component Value Date   NA 141 01/04/2020   K 4.2 01/04/2020   CO2 27 01/04/2020   GLUCOSE 79 01/04/2020   BUN 9 01/04/2020   CREATININE 0.80 01/04/2020   BILITOT 0.3 01/04/2020   ALKPHOS 79 01/04/2020   AST 16 01/04/2020   ALT 11 01/04/2020   PROT 7.3 01/04/2020   ALBUMIN 4.1 01/04/2020   CALCIUM 10.1 01/04/2020   ANIONGAP 6 07/12/2014   Lab Results  Component Value Date   CHOL 171 01/04/2020   Lab Results  Component Value Date   HDL 53 01/04/2020   Lab Results  Component Value Date   LDLCALC 106 (H) 01/04/2020   Lab Results  Component Value Date   TRIG 64 01/04/2020   Lab Results  Component Value Date   CHOLHDL 3.2 01/04/2020   Lab Results  Component Value Date   HGBA1C 5.5 01/04/2020       Assessment & Plan:   Problem List Items Addressed This Visit      Cardiovascular and Mediastinum   Essential hypertension (Chronic)    -BP well  controlled -refilled lisinopril-HCTZ      Relevant Medications   lisinopril-hydrochlorothiazide (ZESTORETIC) 20-25 MG tablet     Musculoskeletal and Integument   Osteoarthritis, knee (  Chronic)    -requesting refill on naproxen for PRN use -refilled today      Relevant Medications   naproxen (NAPROSYN) 500 MG tablet     Other   Morbid obesity (HCC) (Chronic)    Wt Readings from Last 3 Encounters:  06/14/20 263 lb (119.3 kg)  01/04/20 257 lb 1.9 oz (116.6 kg)  09/19/19 258 lb (117 kg)    -She has gained weight since going off phentermine -Refilled phentermine -last fill was 03/18/20      Relevant Medications   phentermine (ADIPEX-P) 37.5 MG tablet   Screening due    -we discussed Cologuard vs Colonoscopy -she states her brother had colon CA -we discussed that colonoscopy would be more appropriate for her, but she does not want an invasive test -Ordered cologuard; better to have screening than to defer testing      Relevant Orders   Cologuard       Meds ordered this encounter  Medications  . lisinopril-hydrochlorothiazide (ZESTORETIC) 20-25 MG tablet    Sig: TAKE 1 TABLET BY MOUTH ONCE DAILY    Dispense:  90 tablet    Refill:  1  . naproxen (NAPROSYN) 500 MG tablet    Sig: TAKE 1 TABLET BY MOUTH TWICE DAILY WITH A MEAL AS NEEDED    Dispense:  30 tablet    Refill:  0  . phentermine (ADIPEX-P) 37.5 MG tablet    Sig: Take half tablet by mouth once daily with breakfast    Dispense:  30 tablet    Refill:  [place], NP

## 2020-06-14 NOTE — Assessment & Plan Note (Signed)
-  we discussed Cologuard vs Colonoscopy -she states her brother had colon CA -we discussed that colonoscopy would be more appropriate for her, but she does not want an invasive test -Ordered cologuard; better to have screening than to defer testing

## 2020-06-14 NOTE — Assessment & Plan Note (Addendum)
Wt Readings from Last 3 Encounters:  06/14/20 263 lb (119.3 kg)  01/04/20 257 lb 1.9 oz (116.6 kg)  09/19/19 258 lb (117 kg)    -She has gained weight since going off phentermine -Refilled phentermine -last fill was 03/18/20

## 2020-06-14 NOTE — Assessment & Plan Note (Signed)
-  BP well controlled -refilled lisinopril-HCTZ

## 2020-06-14 NOTE — Addendum Note (Signed)
Addended by: Demetrius Revel on: 06/14/2020 03:46 PM   Modules accepted: Orders

## 2020-06-21 ENCOUNTER — Encounter (INDEPENDENT_AMBULATORY_CARE_PROVIDER_SITE_OTHER): Payer: Self-pay | Admitting: *Deleted

## 2020-06-26 ENCOUNTER — Other Ambulatory Visit (HOSPITAL_COMMUNITY): Payer: Medicare Other

## 2020-06-26 ENCOUNTER — Encounter (HOSPITAL_COMMUNITY): Payer: Medicare Other

## 2020-07-17 ENCOUNTER — Ambulatory Visit: Payer: Medicare Other | Admitting: Nurse Practitioner

## 2020-07-30 DIAGNOSIS — M170 Bilateral primary osteoarthritis of knee: Secondary | ICD-10-CM | POA: Diagnosis not present

## 2020-08-08 DIAGNOSIS — M170 Bilateral primary osteoarthritis of knee: Secondary | ICD-10-CM | POA: Diagnosis not present

## 2020-08-14 DIAGNOSIS — M170 Bilateral primary osteoarthritis of knee: Secondary | ICD-10-CM | POA: Diagnosis not present

## 2020-09-13 ENCOUNTER — Telehealth: Payer: Self-pay | Admitting: Family Medicine

## 2020-09-13 NOTE — Telephone Encounter (Signed)
Left message for patient to call back and schedule Medicare Annual Wellness Visit (AWV) either virtually or in office.   Last AWVs 09/19/19  please schedule at anytime with [facility]  health coach  This should be a 40 minute visit.

## 2020-11-15 ENCOUNTER — Other Ambulatory Visit: Payer: Self-pay

## 2020-11-15 ENCOUNTER — Ambulatory Visit (INDEPENDENT_AMBULATORY_CARE_PROVIDER_SITE_OTHER): Payer: Medicare HMO

## 2020-11-15 DIAGNOSIS — Z0000 Encounter for general adult medical examination without abnormal findings: Secondary | ICD-10-CM

## 2021-01-29 ENCOUNTER — Telehealth: Payer: Self-pay | Admitting: Family Medicine

## 2021-01-29 NOTE — Telephone Encounter (Signed)
Left message for patient to call back and schedule Medicare Annual Wellness Visit (AWV) in office.   If unable to come into the office for AWV,  please offer to do virtually or by telephone.  Last AWV: 09/19/2019  Please schedule at anytime with [place].  40 minute appointment  Any questions, please contact me at [phone]

## 2021-02-16 ENCOUNTER — Other Ambulatory Visit: Payer: Self-pay | Admitting: Nurse Practitioner

## 2021-02-18 ENCOUNTER — Telehealth: Payer: Self-pay | Admitting: Family Medicine

## 2021-02-18 NOTE — Telephone Encounter (Signed)
Left message for patient to call back and schedule Medicare Annual Wellness Visit (AWV) in office.   If unable to come into the office for AWV,  please offer to do virtually or by telephone.  Last AWV: 09/19/2019  Please schedule at anytime with [place].  40 minute appointment  Any questions, please contact me at [phone]

## 2021-05-29 DIAGNOSIS — M170 Bilateral primary osteoarthritis of knee: Secondary | ICD-10-CM | POA: Diagnosis not present

## 2021-05-29 DIAGNOSIS — M1712 Unilateral primary osteoarthritis, left knee: Secondary | ICD-10-CM | POA: Diagnosis not present

## 2021-08-01 DIAGNOSIS — M170 Bilateral primary osteoarthritis of knee: Secondary | ICD-10-CM | POA: Diagnosis not present

## 2021-08-08 DIAGNOSIS — M170 Bilateral primary osteoarthritis of knee: Secondary | ICD-10-CM | POA: Diagnosis not present

## 2021-08-15 DIAGNOSIS — M13861 Other specified arthritis, right knee: Secondary | ICD-10-CM | POA: Diagnosis not present

## 2021-08-15 DIAGNOSIS — M13862 Other specified arthritis, left knee: Secondary | ICD-10-CM | POA: Diagnosis not present

## 2021-08-15 DIAGNOSIS — M170 Bilateral primary osteoarthritis of knee: Secondary | ICD-10-CM | POA: Diagnosis not present

## 2022-03-06 DIAGNOSIS — M170 Bilateral primary osteoarthritis of knee: Secondary | ICD-10-CM | POA: Diagnosis not present

## 2022-03-13 DIAGNOSIS — M170 Bilateral primary osteoarthritis of knee: Secondary | ICD-10-CM | POA: Diagnosis not present

## 2022-03-20 DIAGNOSIS — M170 Bilateral primary osteoarthritis of knee: Secondary | ICD-10-CM | POA: Diagnosis not present

## 2022-04-14 ENCOUNTER — Telehealth: Payer: Self-pay | Admitting: Family Medicine

## 2022-04-14 NOTE — Telephone Encounter (Signed)
Pt needs appt with provider. Has not been in in over a year

## 2022-04-14 NOTE — Telephone Encounter (Signed)
Called patient left voicemail to call office to schedule an appointment

## 2022-04-15 NOTE — Telephone Encounter (Signed)
Patient called back scheduled appt 04/30/2022 @ 2:20 pm

## 2022-04-15 NOTE — Telephone Encounter (Signed)
Please contact patient does patient need blood work before her apt she changed to 05/21/2022. Please return patient call.

## 2022-04-16 ENCOUNTER — Other Ambulatory Visit: Payer: Self-pay

## 2022-04-16 DIAGNOSIS — Z139 Encounter for screening, unspecified: Secondary | ICD-10-CM

## 2022-04-16 DIAGNOSIS — I10 Essential (primary) hypertension: Secondary | ICD-10-CM

## 2022-04-16 DIAGNOSIS — R7303 Prediabetes: Secondary | ICD-10-CM

## 2022-04-16 NOTE — Telephone Encounter (Signed)
Lvm letting patient know.

## 2022-04-30 ENCOUNTER — Ambulatory Visit (INDEPENDENT_AMBULATORY_CARE_PROVIDER_SITE_OTHER): Payer: Medicare HMO | Admitting: Family Medicine

## 2022-04-30 ENCOUNTER — Ambulatory Visit: Payer: Medicare HMO | Admitting: Family Medicine

## 2022-04-30 ENCOUNTER — Encounter: Payer: Self-pay | Admitting: Family Medicine

## 2022-04-30 DIAGNOSIS — J0110 Acute frontal sinusitis, unspecified: Secondary | ICD-10-CM | POA: Diagnosis not present

## 2022-04-30 MED ORDER — AMOXICILLIN-POT CLAVULANATE 875-125 MG PO TABS: 1.0000 | ORAL_TABLET | Freq: Two times a day (BID) | ORAL | 0 refills | Status: AC

## 2022-04-30 MED ORDER — AZELASTINE HCL 0.1 % NA SOLN: 2.0000 | Freq: Two times a day (BID) | NASAL | 12 refills | Status: DC

## 2022-04-30 NOTE — Progress Notes (Signed)
Virtual Visit via Telephone Note   This visit type was conducted via telephone. This format is felt to be most appropriate for this patient at this time.  The patient did not have access to video technology/had technical difficulties with video requiring transitioning to audio format only (telephone).  All issues noted in this document were discussed and addressed.  No physical exam could be performed with this format.  Evaluation Performed:  Follow-up visit  Date:  04/30/2022   ID:  Suzanne Andrews, DOB 1965/12/25, MRN [id number]  Patient Location: Home Provider Location: Clinic  Participants: Patient Location of Patient: Home Location of Provider: clinic Consent was obtain for visit to be over via telehealth. I verified that I am speaking with the correct person using two identifiers.  PCP:  Fayrene Helper, MD   Chief Complaint:    History of Present Illness:    Suzanne Andrews is a 56 y.o. female with complaints of nasal congestion, frontal sinus pain, frontal sinus pressure, cough, and postnasal drainage.  Onset of symptoms 04/24/2022.  She reports taking Mucinex, TheraFlu, Claritin, and Afrin nasal spray, with minimal relief of her symptoms.  She reports that she received her COVID and flu vaccine 2 weeks ago.   The patient does have symptoms concerning for COVID-19 infection (fever, chills, cough, or new shortness of breath).   Past Medical, Surgical, Social History, Allergies, and Medications have been Reviewed.  Past Medical History:  Diagnosis Date   Hypertension    PVD (peripheral vascular disease) ([place])    Past Surgical History:  Procedure Laterality Date   CESAREAN SECTION     x 2   CHOLECYSTECTOMY     KNEE ARTHROSCOPY WITH MEDIAL MENISECTOMY Left 07/18/2014   Procedure: LEFT KNEE ARTHROSCOPY WITH MEDIAL AND LATERAL MENISECTOMY;  Surgeon: Sanjuana Kava, MD;  Location: [facility];  Service: Orthopedics;  Laterality: Left;   TUBAL LIGATION     vascular  surgery bilateral leg       Current Meds  Medication Sig   amoxicillin-clavulanate (AUGMENTIN) 875-125 MG tablet Take 1 tablet by mouth 2 (two) times daily for 5 days.   azelastine (ASTELIN) 0.1 % nasal spray Place 2 sprays into both nostrils 2 (two) times daily. Use in each nostril as directed   betamethasone dipropionate 0.05 % cream Apply sparingly two times daily to rash for 10 days, then as needed   cyclobenzaprine (FLEXERIL) 10 MG tablet Take one tablet at bedtime , as needed, for muscle spasm   lisinopril-hydrochlorothiazide (ZESTORETIC) 20-25 MG tablet Take 1 tablet by mouth once daily   loratadine (CLARITIN) 10 MG tablet Take 10 mg by mouth daily as needed for allergies.   naproxen (NAPROSYN) 500 MG tablet TAKE 1 TABLET BY MOUTH TWICE DAILY WITH A MEAL AS NEEDED   pentoxifylline (TRENTAL) 400 MG CR tablet Take 400 mg by mouth 3 (three) times daily.   phentermine (ADIPEX-P) 37.5 MG tablet Take half tablet by mouth once daily with breakfast   VITAMIN D PO Take 1 capsule by mouth daily.     Allergies:   Septra [sulfamethoxazole-trimethoprim]   ROS:   Please see the history of present illness.     All other systems reviewed and are negative.   Labs/Other Tests and Data Reviewed:    Recent Labs: No results found for requested labs within last 365 days.   Recent Lipid Panel Lab Results  Component Value Date/Time   CHOL 171 01/04/2020 09:32 AM   TRIG 64  01/04/2020 09:32 AM   HDL 53 01/04/2020 09:32 AM   CHOLHDL 3.2 01/04/2020 09:32 AM   CHOLHDL 2.7 05/15/2015 03:16 PM   LDLCALC 106 (H) 01/04/2020 09:32 AM    Wt Readings from Last 3 Encounters:  06/14/20 263 lb (119.3 kg)  01/04/20 257 lb 1.9 oz (116.6 kg)  09/19/19 258 lb (117 kg)     Objective:    Vital Signs:  There were no vitals taken for this visit.     ASSESSMENT & PLAN:   Bacterial sinusitis We will treat with Augmentin twice daily for 5 days Azelastine nasal spray ordered Encouraged to take Tylenol  for body aches and fever Encouraged to take over-the-counter Robitussin for cough   Time:   Today, I have spent 15 minutes reviewing the chart, including problem list, medications, and with the patient with telehealth technology discussing the above problems.   Medication Adjustments/Labs and Tests Ordered: Current medicines are reviewed at length with the patient today.  Concerns regarding medicines are outlined above.   Tests Ordered: No orders of the defined types were placed in this encounter.   Medication Changes: Meds ordered this encounter  Medications   amoxicillin-clavulanate (AUGMENTIN) 875-125 MG tablet    Sig: Take 1 tablet by mouth 2 (two) times daily for 5 days.    Dispense:  10 tablet    Refill:  0   azelastine (ASTELIN) 0.1 % nasal spray    Sig: Place 2 sprays into both nostrils 2 (two) times daily. Use in each nostril as directed    Dispense:  30 mL    Refill:  12     Note: This dictation was prepared with Dragon dictation along with smaller phrase technology. Similar sounding words can be transcribed inadequately or may not be corrected upon review. Any transcriptional errors that result from this process are unintentional.      Disposition:  Follow up  Signed, Alvira Monday, FNP  04/30/2022 8:48 PM     [facility]

## 2022-05-21 ENCOUNTER — Ambulatory Visit: Payer: Medicare HMO | Admitting: Family Medicine

## 2022-06-10 ENCOUNTER — Ambulatory Visit: Payer: Medicare HMO | Admitting: Family Medicine

## 2022-06-11 ENCOUNTER — Encounter: Payer: Self-pay | Admitting: Family Medicine

## 2022-06-11 ENCOUNTER — Ambulatory Visit (INDEPENDENT_AMBULATORY_CARE_PROVIDER_SITE_OTHER): Payer: Medicare HMO | Admitting: Family Medicine

## 2022-06-11 VITALS — Ht 67.0 in | Wt 258.0 lb

## 2022-06-11 DIAGNOSIS — Z0000 Encounter for general adult medical examination without abnormal findings: Secondary | ICD-10-CM | POA: Diagnosis not present

## 2022-06-11 NOTE — Progress Notes (Addendum)
Annual Wellness Visit     Patient: Suzanne Andrews, Female    DOB: [birth year], 57 y.o.   MRN: [id number] Visit Date: 06/11/2022  Today's Provider: Juanda Chance, FNP   I,Keynan Heffern Darlina Sicilian as a scribe for Juanda Chance, FNP.,have documented all relevant documentation on the behalf of Juanda Chance, FNP,as directed by  Juanda Chance, FNP while in the presence of [place], FNP.   Chief Complaint  Patient presents with   Annual Exam   Subjective    Suzanne Andrews is a 57 y.o. female who presents today for her Annual Wellness Visit. She reports consuming a general diet. Gym/ health club routine includes cardio and light weights. She generally feels fairly well. She reports sleeping well. She does not have additional problems to discuss today.    Patient Location: At home. Provider Location: [facility] Total time spent with patient via telehealth/video 8 minutes    HPI    Medications: Outpatient Medications Prior to Visit  Medication Sig   azelastine (ASTELIN) 0.1 % nasal spray Place 2 sprays into both nostrils 2 (two) times daily. Use in each nostril as directed   betamethasone dipropionate 0.05 % cream Apply sparingly two times daily to rash for 10 days, then as needed   cyclobenzaprine (FLEXERIL) 10 MG tablet Take one tablet at bedtime , as needed, for muscle spasm   FLUARIX QUADRIVALENT 0.5 ML injection    lisinopril-hydrochlorothiazide (ZESTORETIC) 20-25 MG tablet Take 1 tablet by mouth once daily   loratadine (CLARITIN) 10 MG tablet Take 10 mg by mouth daily as needed for allergies.   naproxen (NAPROSYN) 500 MG tablet TAKE 1 TABLET BY MOUTH TWICE DAILY WITH A MEAL AS NEEDED   pentoxifylline (TRENTAL) 400 MG CR tablet Take 400 mg by mouth 3 (three) times daily.   phentermine (ADIPEX-P) 37.5 MG tablet Take half tablet by mouth once daily with breakfast   SPIKEVAX syringe    VITAMIN D PO  Take 1 capsule by mouth daily.   No facility-administered medications prior to visit.    Allergies  Allergen Reactions   Septra [Sulfamethoxazole-Trimethoprim] Rash    Patient Care Team: Fayrene Helper, MD as PCP - General  Review of Systems  Constitutional:  Negative for activity change, chills and fever.  HENT:  Negative for rhinorrhea, sinus pain and trouble swallowing.   Eyes:  Negative for visual disturbance.  Respiratory:  Negative for apnea and shortness of breath.   Cardiovascular:  Negative for chest pain.  Gastrointestinal:  Negative for abdominal distention, abdominal pain, diarrhea and nausea.  Endocrine: Negative for cold intolerance.  Genitourinary:  Negative for difficulty urinating and vaginal bleeding.  Musculoskeletal:  Negative for gait problem.  Skin:  Negative for color change.  Neurological:  Negative for dizziness.  Psychiatric/Behavioral:  Negative for agitation.         Objective    Vitals: There were no vitals taken for this visit.    Physical Exam Telehealth Visit   Most recent functional status assessment:     No data to display         Most recent fall risk assessment:    04/30/2022    8:46 AM  [place] in the past year? 0  Number falls in past yr: 0  Injury with Fall? 0  Risk for fall due to : No Fall Risks  Follow up Falls evaluation completed  Most recent depression screenings:    04/30/2022    8:46 AM 06/14/2020    3:24 PM  PHQ 2/9 Scores  PHQ - 2 Score 0 0  PHQ- 9 Score 0    Most recent cognitive screening:    09/19/2019   11:50 AM  6CIT Screen  What Year? 0 points  What month? 0 points  What time? 0 points  Count back from 20 0 points  Months in reverse 0 points  Repeat phrase 0 points  Total Score 0 points   Most recent Audit-C alcohol use screening     No data to display         A score of 3 or more in women, and 4 or more in men indicates increased risk for alcohol abuse, EXCEPT if  all of the points are from question 1   No results found for any visits on 06/11/22.  Assessment & Plan     Annual wellness visit done today including the all of the following: Reviewed patient's Family Medical History Reviewed and updated list of patient's medical providers Assessment of cognitive impairment was done Assessed patient's functional ability Established a written schedule for health screening [place] Completed and Reviewed  Exercise Activities and Dietary recommendations  Goals      DIET - INCREASE WATER INTAKE     Weight (lb) < 200 lb (90.7 kg)     Lose 20 lbs         Immunization History  Administered Date(s) Administered   Influenza Split 04/02/2011, 02/12/2012, 04/07/2018   Influenza,inj,Quad PF,6+ Mos 02/23/2013, 08/22/2014, 02/28/2015, 04/08/2017, 03/17/2019   Moderna Sars-Covid-2 Vaccination 09/15/2019, 10/13/2019, 04/14/2020   Td 11/12/2007    Health Maintenance  Topic Date Due   Zoster Vaccines- Shingrix (1 of 2) Never done   COLONOSCOPY (Pts 45-42yr Insurance coverage will need to be confirmed)  Never done   DTaP/Tdap/Td (2 - Tdap) 11/11/2017   Medicare Annual Wellness (AWV)  11/15/2021   COVID-19 Vaccine (4 - 2023-24 season) 01/31/2022   PAP SMEAR-Modifier  03/16/2022   MAMMOGRAM  05/30/2022   INFLUENZA VACCINE  08/31/2022 (Originally 12/31/2021)   Hepatitis C Screening  Completed   HIV Screening  Completed   HPV VACCINES  Aged Out     Discussed health benefits of physical activity, and encouraged her to engage in regular exercise appropriate for her age and condition.      No follow-ups on file.        [facility] [phone](phone) [phone](fax)  CAnna

## 2022-06-17 ENCOUNTER — Ambulatory Visit (INDEPENDENT_AMBULATORY_CARE_PROVIDER_SITE_OTHER): Payer: Medicare HMO | Admitting: Family Medicine

## 2022-06-17 ENCOUNTER — Encounter: Payer: Self-pay | Admitting: Family Medicine

## 2022-06-17 ENCOUNTER — Encounter: Payer: Medicare HMO | Admitting: Internal Medicine

## 2022-06-17 VITALS — BP 152/92 | HR 84 | Ht 67.0 in | Wt 285.0 lb

## 2022-06-17 DIAGNOSIS — M1712 Unilateral primary osteoarthritis, left knee: Secondary | ICD-10-CM

## 2022-06-17 DIAGNOSIS — I10 Essential (primary) hypertension: Secondary | ICD-10-CM | POA: Diagnosis not present

## 2022-06-17 DIAGNOSIS — Z1239 Encounter for other screening for malignant neoplasm of breast: Secondary | ICD-10-CM | POA: Diagnosis not present

## 2022-06-17 DIAGNOSIS — Z139 Encounter for screening, unspecified: Secondary | ICD-10-CM | POA: Diagnosis not present

## 2022-06-17 DIAGNOSIS — R7303 Prediabetes: Secondary | ICD-10-CM

## 2022-06-17 MED ORDER — LISINOPRIL-HYDROCHLOROTHIAZIDE 20-25 MG PO TABS: 1.0000 | ORAL_TABLET | Freq: Every day | ORAL | 3 refills | Status: DC

## 2022-06-17 MED ORDER — SEMAGLUTIDE-WEIGHT MANAGEMENT 0.25 MG/0.5ML ~~LOC~~ SOAJ: 0.2500 mg | SUBCUTANEOUS | 0 refills | Status: DC

## 2022-06-17 NOTE — Patient Instructions (Signed)
F/U in 4 weeks , re evaluate blood pressure, call if you need me sooner  New additional medication for BP is amlodipine 5 mg daily, continue current med  I will send a message re lab results  I have prescribed once weekly wegovy for weight loss, let me know if covered please , send a message  Please schedule mammogram which is past due , at checkout  Thanks for choosing [facility], we consider it a privelige to serve you.

## 2022-06-18 LAB — CBC
Hematocrit: 38.1 % (ref 34.0–46.6)
Hemoglobin: 12.9 g/dL (ref 11.1–15.9)
MCH: 29.1 pg (ref 26.6–33.0)
MCHC: 33.9 g/dL (ref 31.5–35.7)
MCV: 86 fL (ref 79–97)
Platelets: 391 10*3/uL (ref 150–450)
RBC: 4.43 x10E6/uL (ref 3.77–5.28)
RDW: 12.4 % (ref 11.7–15.4)
WBC: 5.2 10*3/uL (ref 3.4–10.8)

## 2022-06-18 LAB — CMP14+EGFR
ALT: 22 IU/L (ref 0–32)
AST: 33 IU/L (ref 0–40)
Albumin/Globulin Ratio: 1.3 (ref 1.2–2.2)
Albumin: 4.3 g/dL (ref 3.8–4.9)
Alkaline Phosphatase: 84 IU/L (ref 44–121)
BUN/Creatinine Ratio: 13 (ref 9–23)
BUN: 11 mg/dL (ref 6–24)
Bilirubin Total: 0.5 mg/dL (ref 0.0–1.2)
CO2: 25 mmol/L (ref 20–29)
Calcium: 10.3 mg/dL — ABNORMAL HIGH (ref 8.7–10.2)
Chloride: 101 mmol/L (ref 96–106)
Creatinine, Ser: 0.82 mg/dL (ref 0.57–1.00)
Globulin, Total: 3.2 g/dL (ref 1.5–4.5)
Glucose: 89 mg/dL (ref 70–99)
Potassium: 4.2 mmol/L (ref 3.5–5.2)
Sodium: 141 mmol/L (ref 134–144)
Total Protein: 7.5 g/dL (ref 6.0–8.5)
eGFR: 83 mL/min/{1.73_m2} (ref >59)

## 2022-06-18 LAB — LIPID PANEL
Chol/HDL Ratio: 3.4 ratio (ref 0.0–4.4)
Cholesterol, Total: 188 mg/dL (ref 100–199)
HDL: 55 mg/dL (ref >39)
LDL Chol Calc (NIH): 121 mg/dL — ABNORMAL HIGH (ref 0–99)
Triglycerides: 67 mg/dL (ref 0–149)
VLDL Cholesterol Cal: 12 mg/dL (ref 5–40)

## 2022-06-18 LAB — TSH: TSH: 1.58 u[IU]/mL (ref 0.450–4.500)

## 2022-06-18 LAB — HEMOGLOBIN A1C
Est. average glucose Bld gHb Est-mCnc: 117 mg/dL
Hgb A1c MFr Bld: 5.7 % — ABNORMAL HIGH (ref 4.8–5.6)

## 2022-06-18 LAB — VITAMIN D 25 HYDROXY (VIT D DEFICIENCY, FRACTURES): Vit D, 25-Hydroxy: 33.9 ng/mL (ref 30.0–100.0)

## 2022-06-18 IMAGING — MG MM DIGITAL DIAGNOSTIC UNILAT*R* W/ TOMO W/ CAD
4 series · 4 of 12 positions shown · non-contrast
Comparison: Previous exams.

CLINICAL DATA: Screening recall for possible right breast
asymmetry.

EXAM:
DIGITAL DIAGNOSTIC UNILATERAL RIGHT MAMMOGRAM WITH TOMO AND CAD;
ULTRASOUND RIGHT BREAST LIMITED

[R ML synth-2D]
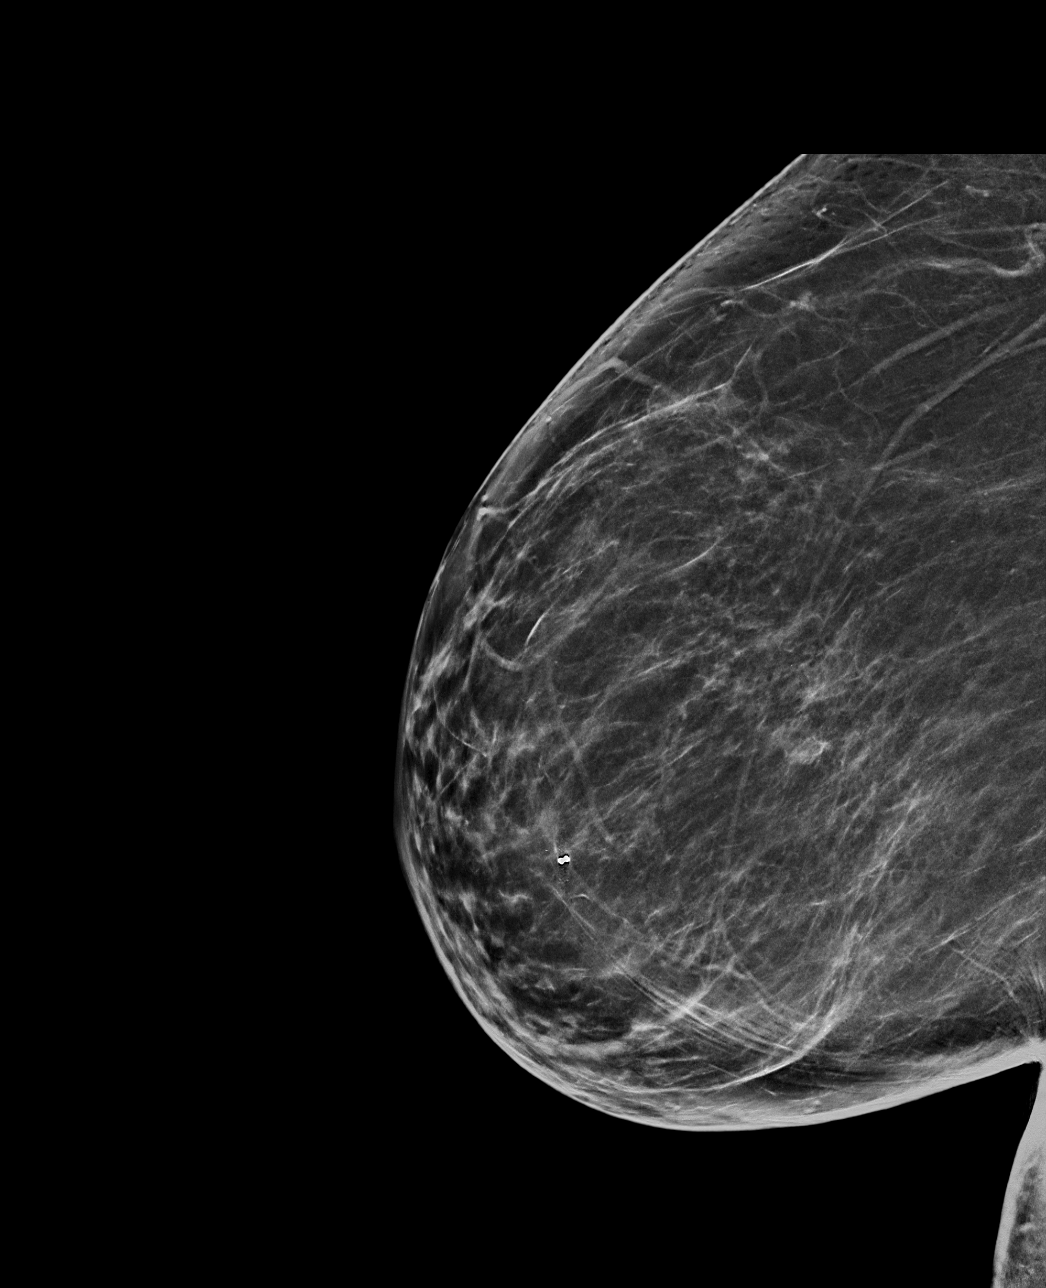

[R MLO synth-2D]
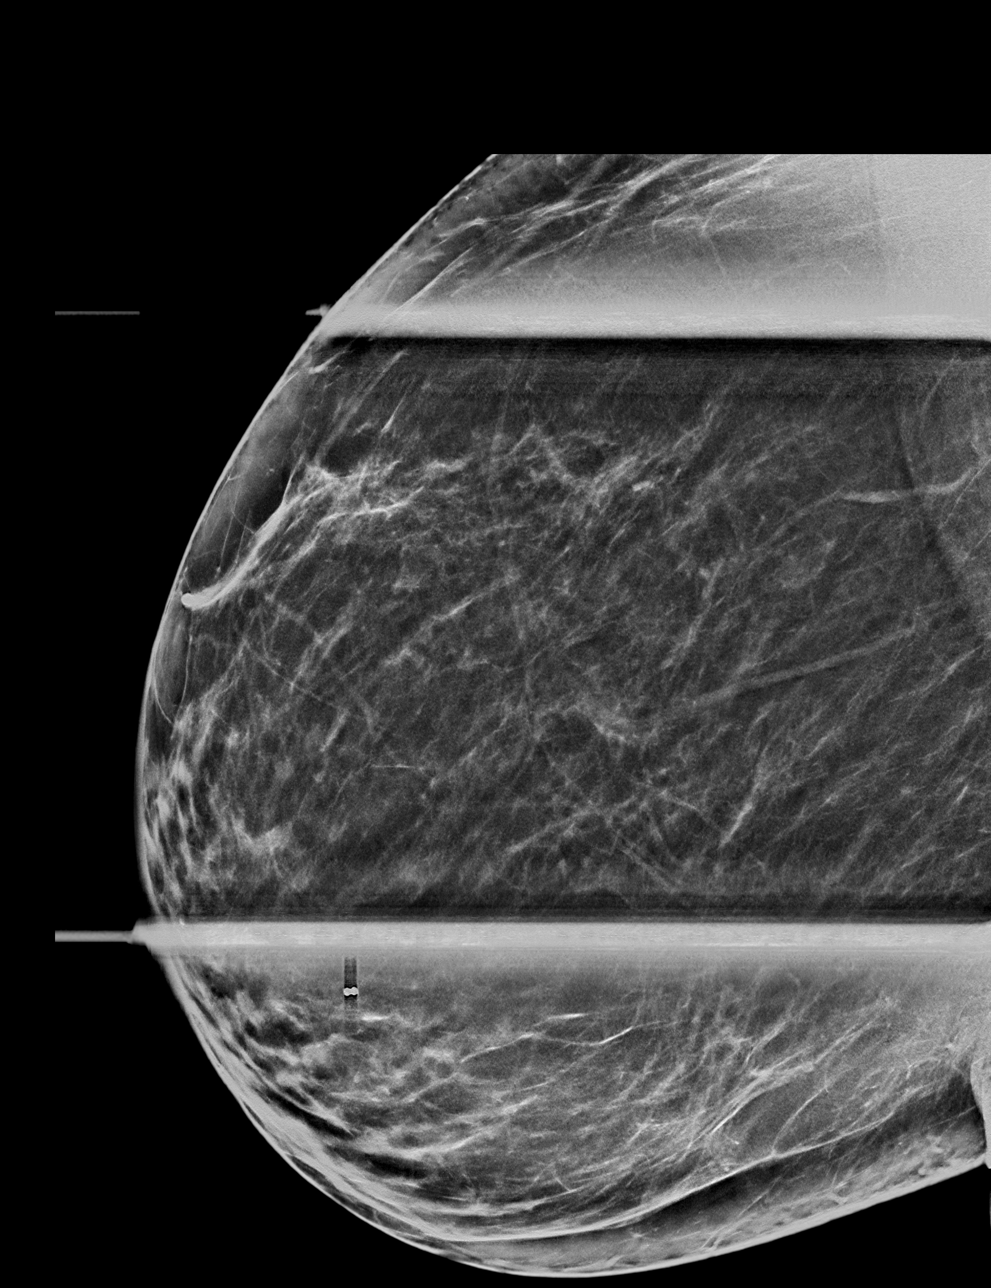

[R MLO tomo · tomo slice 33/65.0]
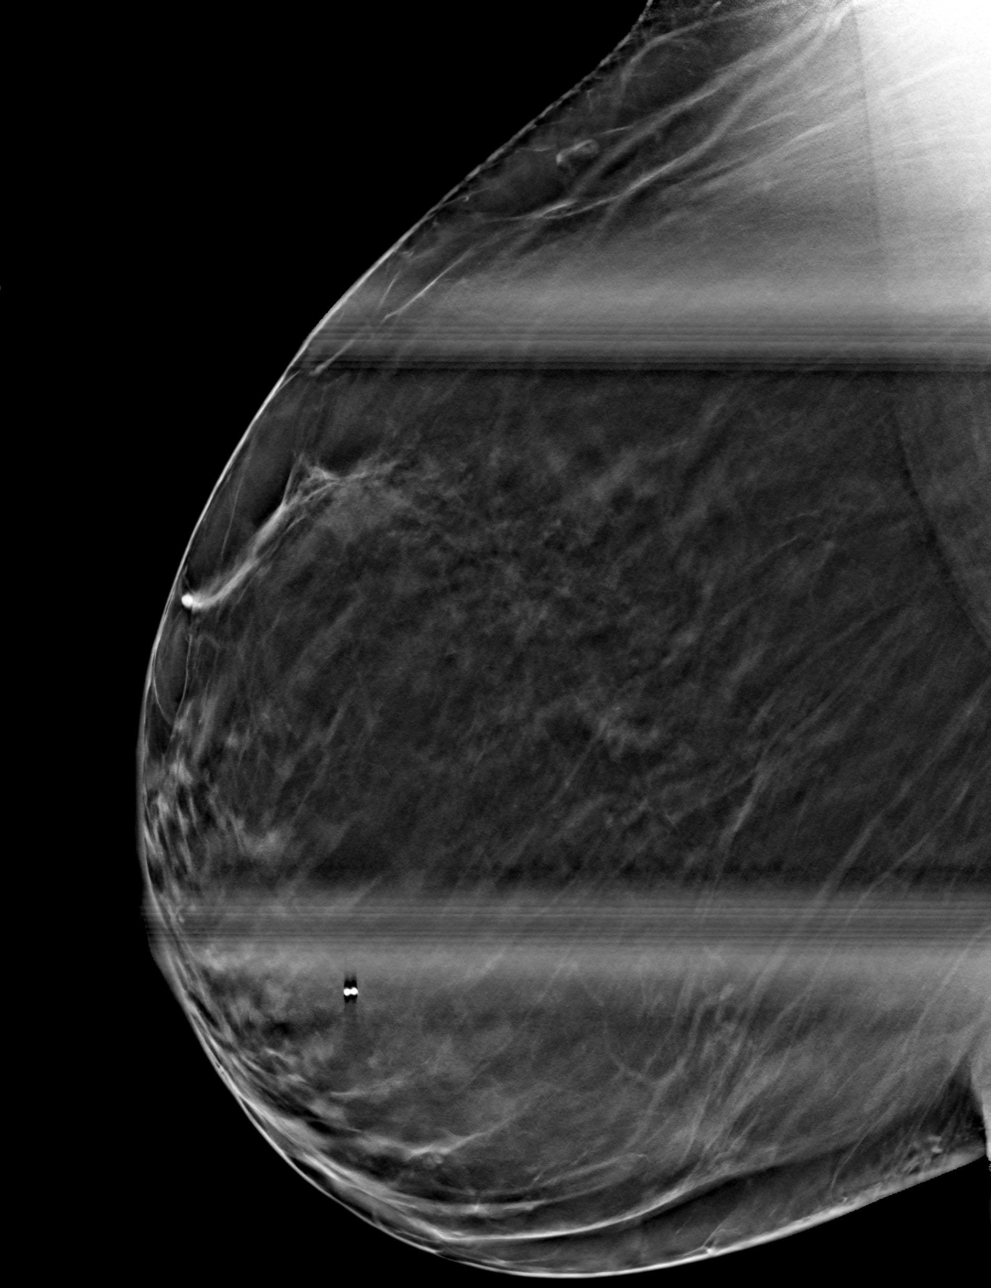

[R ML tomo · tomo slice 35/70.0]
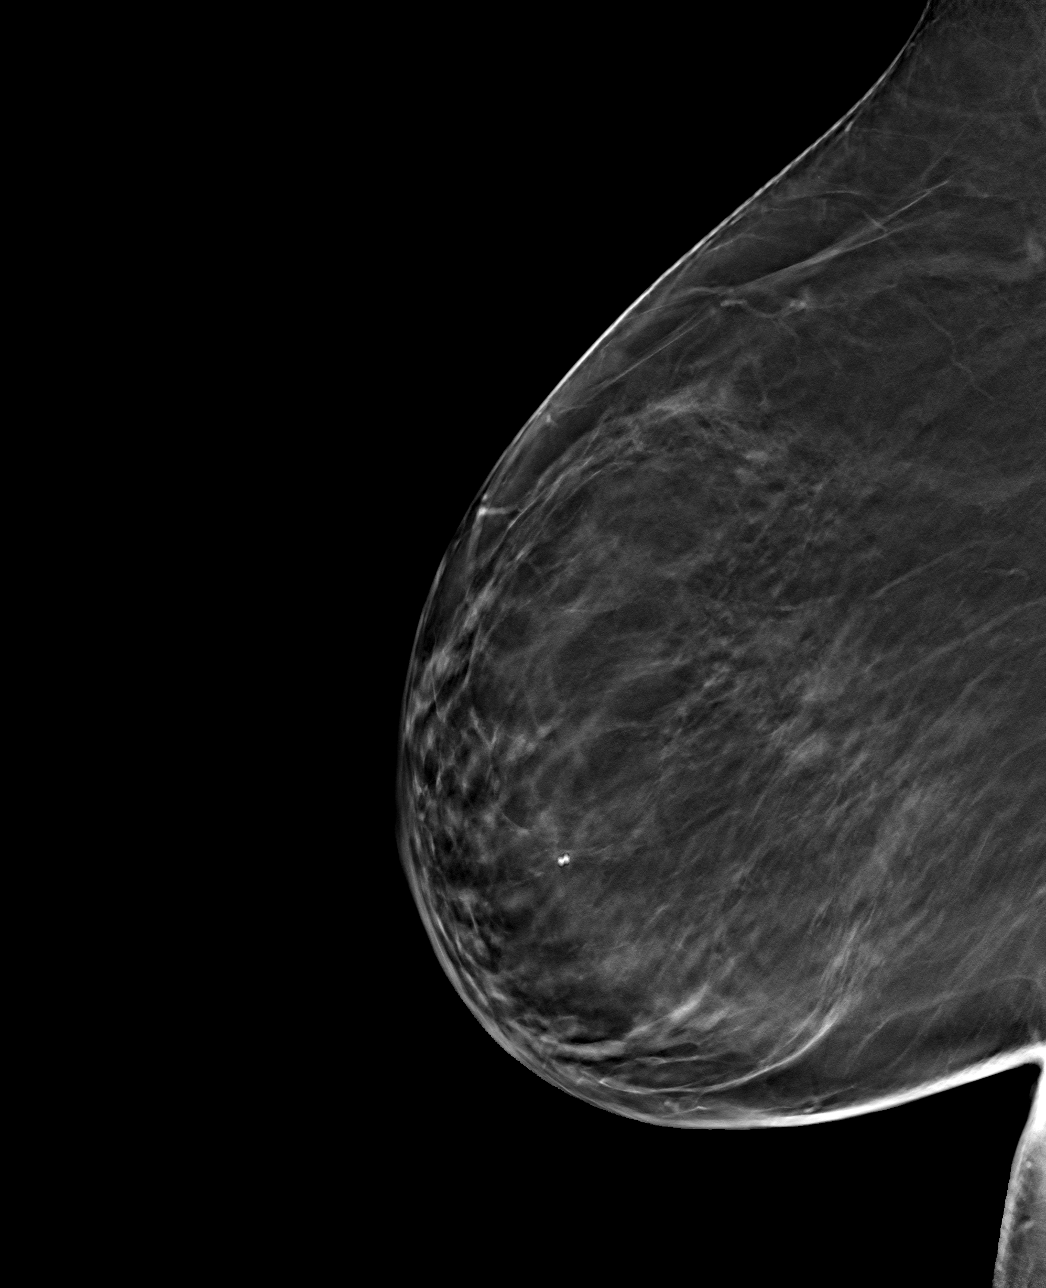

[4 of 12 positions shown; findings below may reference images not displayed]

ACR Breast Density Category b: There are scattered areas of
fibroglandular density.
FINDINGS: Additional tomograms were performed of the right breast. There is an
oval mass in the upper-outer right breast measuring 3 8 cm.

Mammographic images were processed with CAD.

Targeted ultrasound of the outer right breast was performed. There
is a cyst in the right breast at 9 o'clock 9 cm from nipple
measuring 0.8 x 0.3 x 0.6 cm. No suspicious masses or abnormality
seen in the outer right breast.
IMPRESSION: No findings of malignancy in the right breast.

RECOMMENDATION:
Screening mammogram in one year.(Code:DC-O-NQM)

I have discussed the findings and recommendations with the patient.
If applicable, a reminder letter will be sent to the patient
regarding the next appointment.

BI-RADS CATEGORY  2: Benign.

## 2022-06-18 IMAGING — US US BREAST*R* LIMITED INC AXILLA
1 series · 6 of 6 positions shown · non-contrast
Comparison: Previous exams.

CLINICAL DATA: Screening recall for possible right breast
asymmetry.

EXAM:
DIGITAL DIAGNOSTIC UNILATERAL RIGHT MAMMOGRAM WITH TOMO AND CAD;
ULTRASOUND RIGHT BREAST LIMITED

[Series 1: us breast*right* limited inc axilla · 0.07mm/px · 6 of 6 slices shown]
[im 1/6]
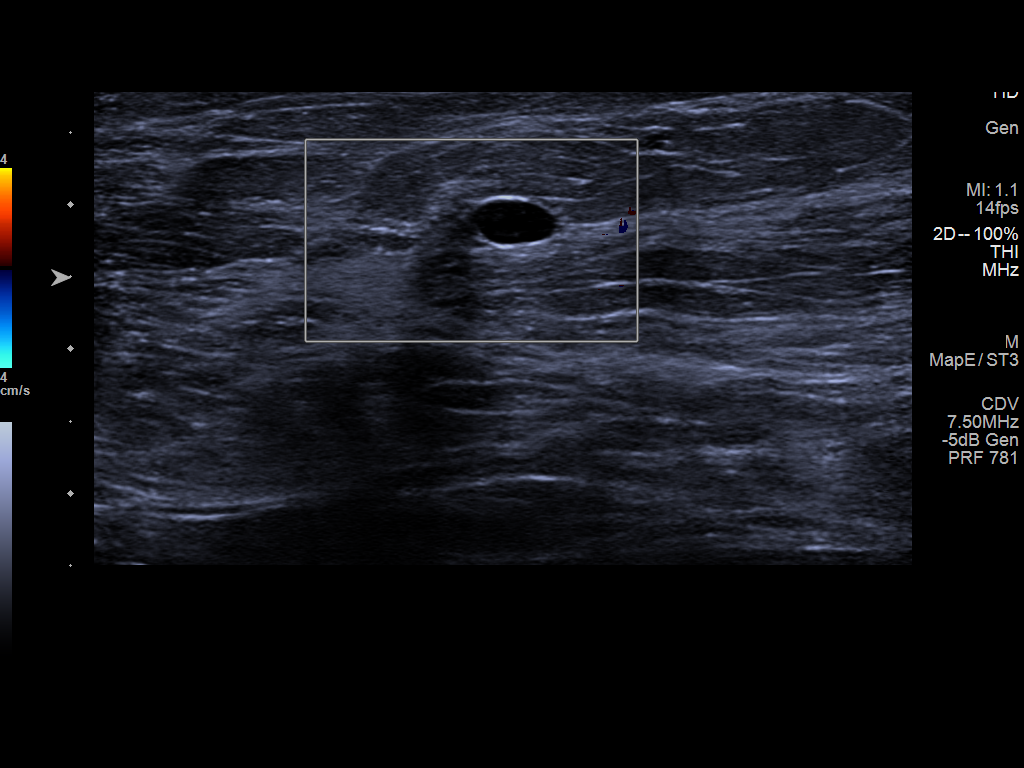
[im 2/6]
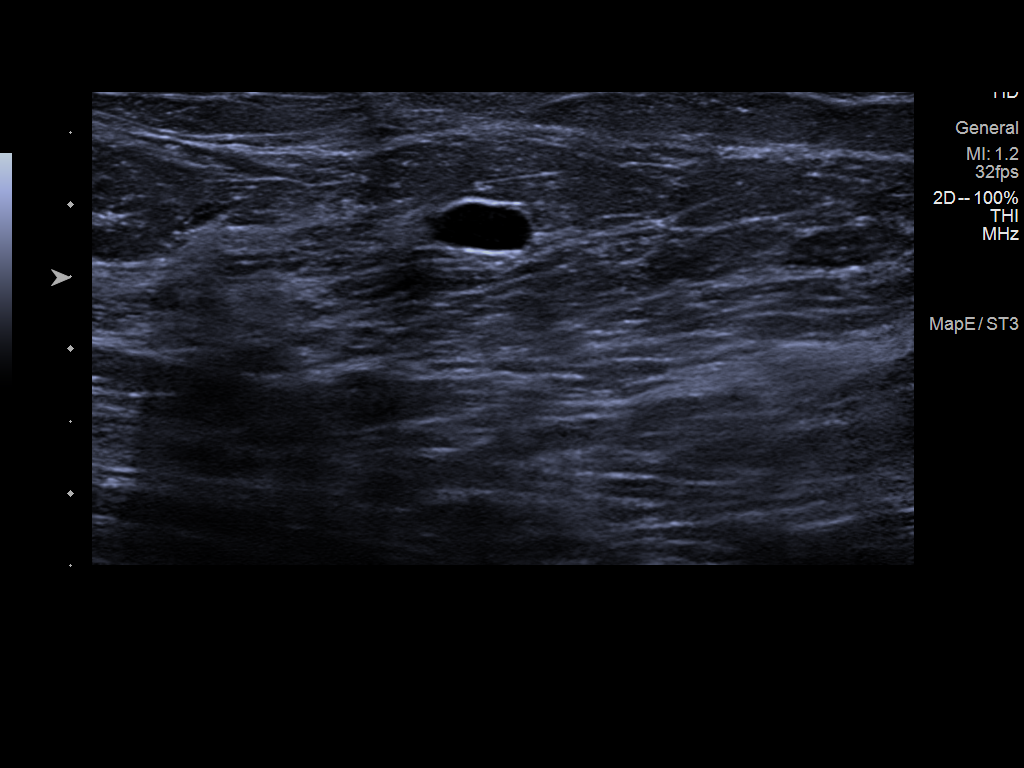
[im 3/6]
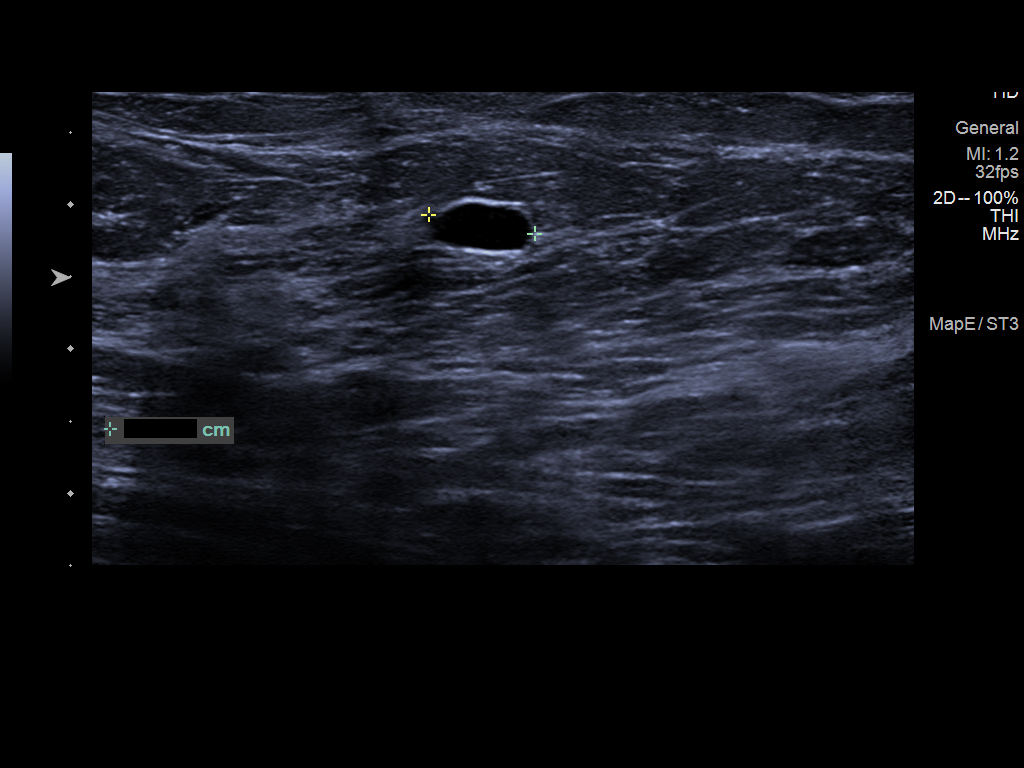
[im 4/6]
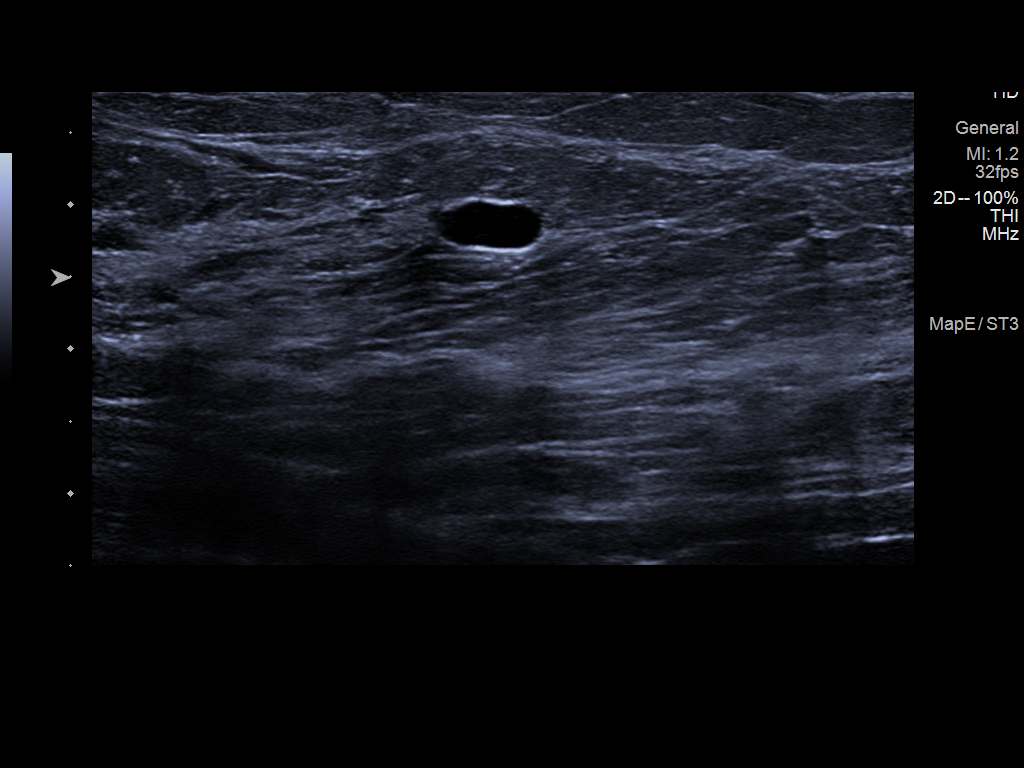
[im 5/6]
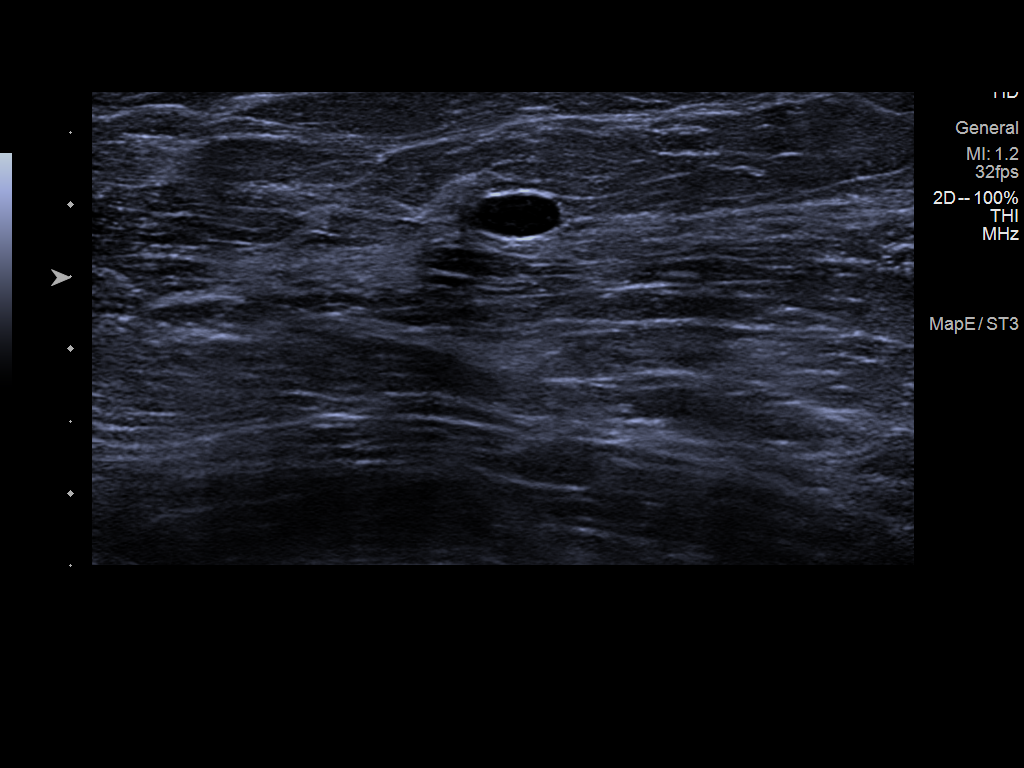
[im 6/6]
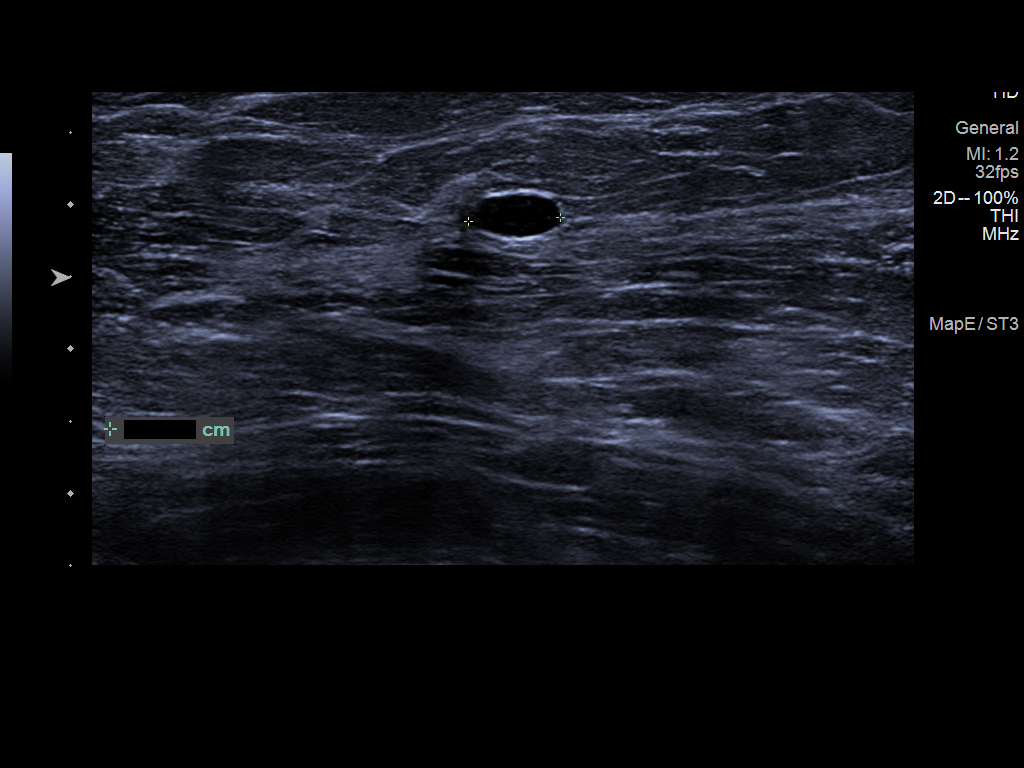

[6 of 6 positions shown; findings below may reference images not displayed]

ACR Breast Density Category b: There are scattered areas of
fibroglandular density.
FINDINGS: Additional tomograms were performed of the right breast. There is an
oval mass in the upper-outer right breast measuring 3 8 cm.

Mammographic images were processed with CAD.

Targeted ultrasound of the outer right breast was performed. There
is a cyst in the right breast at 9 o'clock 9 cm from nipple
measuring 0.8 x 0.3 x 0.6 cm. No suspicious masses or abnormality
seen in the outer right breast.
IMPRESSION: No findings of malignancy in the right breast.

RECOMMENDATION:
Screening mammogram in one year.(Code:DC-O-NQM)

I have discussed the findings and recommendations with the patient.
If applicable, a reminder letter will be sent to the patient
regarding the next appointment.

BI-RADS CATEGORY  2: Benign.

## 2022-06-22 ENCOUNTER — Encounter: Payer: Self-pay | Admitting: Family Medicine

## 2022-06-22 NOTE — Assessment & Plan Note (Signed)
Uncontrolled , amlodipine aded and re eval DASH diet and commitment to daily physical activity for a minimum of 30 minutes discussed and encouraged, as a part of hypertension management. The importance of attaining a healthy weight is also discussed.     06/17/2022    3:52 PM 06/17/2022    3:40 PM 06/17/2022    3:01 PM 06/17/2022    2:55 PM 06/11/2022    2:23 PM 06/14/2020    3:21 PM 01/04/2020    8:09 AM  BP/Weight  Systolic BP 825 003 704 888  916 945  Diastolic BP 92 90 78 79  75 76  Wt. (Lbs)    285.04 258 263 257.12  BMI    44.64 kg/m2 40.41 kg/m2 41.19 kg/m2 40.27 kg/m2     '

## 2022-06-22 NOTE — Assessment & Plan Note (Signed)
  Patient re-educated about  the importance of commitment to a  minimum of 150 minutes of exercise per week as able.  The importance of healthy food choices with portion control discussed, as well as eating regularly and within a 12 hour window most days. The need to choose "clean , green" food 50 to 75% of the time is discussed, as well as to make water the primary drink and set a goal of 64 ounces water daily.       06/17/2022    2:55 PM 06/11/2022    2:23 PM 06/14/2020    3:21 PM  Weight /BMI  Weight 285 lb 0.6 oz 258 lb 263 lb  Height '5\' 7"'$  (1.702 m) '5\' 7"'$  (1.702 m) '5\' 7"'$  (1.702 m)  BMI 44.64 kg/m2 40.41 kg/m2 41.19 kg/m2    Semaglutide prescribed

## 2022-06-22 NOTE — Assessment & Plan Note (Signed)
Worse pain and reduced function with weight gain

## 2022-06-22 NOTE — Progress Notes (Signed)
Suzanne Andrews     MRN: [id number]      DOB: 02-28-1966   HPI Suzanne Andrews is here for follow up and re-evaluation of chronic medical conditions, medication management and review of any available recent lab and radiology data.  Preventive health is updated, specifically  Cancer screening and Immunization.   ROS Denies recent fever or chills. Denies sinus pressure, nasal congestion, ear pain or sore throat. Denies chest congestion, productive cough or wheezing. Denies chest pains, palpitations and leg swelling Denies abdominal pain, nausea, vomiting,diarrhea or constipation.   Denies dysuria, frequency, hesitancy or incontinence. C/o knee pain, ready for surgery but needs to lose weight  Denies headaches, seizures, numbness, or tingling. C/o stress and anxiety I the past year had to put her health on hold caring for her daughter who had bilateral knee replacement due to rheumatoid arthritis Denies skin break down or rash.   PE  BP (!) 152/92   Pulse 84   Ht '5\' 7"'$  (1.702 m)   Wt 285 lb 0.6 oz (129.3 kg)   SpO2 96%   BMI 44.64 kg/m   Patient alert and oriented and in no cardiopulmonary distress.  HEENT: No facial asymmetry, EOMI,     Neck supple .  Chest: Clear to auscultation bilaterally.  CVS: S1, S2 no murmurs, no S3.Regular rate.  ABD: Soft non tender.   Ext: No edema  MS: Adequate ROM spine, shoulders, hips and markedly reduced in  knees.  Skin: Intact, no ulcerations or rash noted.  Psych: Good eye contact, normal affect. Memory intact not anxious or depressed appearing.  CNS: CN 2-12 intact, power,  normal throughout.no focal deficits noted.   Assessment & Plan  Essential hypertension Uncontrolled , amlodipine aded and re eval DASH diet and commitment to daily physical activity for a minimum of 30 minutes discussed and encouraged, as a part of hypertension management. The importance of attaining a healthy weight is also discussed.     06/17/2022     3:52 PM 06/17/2022    3:40 PM 06/17/2022    3:01 PM 06/17/2022    2:55 PM 06/11/2022    2:23 PM 06/14/2020    3:21 PM 01/04/2020    8:09 AM  BP/Weight  Systolic BP 353 299 242 683  419 622  Diastolic BP 92 90 78 79  75 76  Wt. (Lbs)    285.04 258 263 257.12  BMI    44.64 kg/m2 40.41 kg/m2 41.19 kg/m2 40.27 kg/m2     '  Morbid obesity [facility])  Patient re-educated about  the importance of commitment to a  minimum of 150 minutes of exercise per week as able.  The importance of healthy food choices with portion control discussed, as well as eating regularly and within a 12 hour window most days. The need to choose "clean , green" food 50 to 75% of the time is discussed, as well as to make water the primary drink and set a goal of 64 ounces water daily.       06/17/2022    2:55 PM 06/11/2022    2:23 PM 06/14/2020    3:21 PM  Weight /BMI  Weight 285 lb 0.6 oz 258 lb 263 lb  Height '5\' 7"'$  (1.702 m) '5\' 7"'$  (1.702 m) '5\' 7"'$  (1.702 m)  BMI 44.64 kg/m2 40.41 kg/m2 41.19 kg/m2    Semaglutide prescribed  Prediabetes Patient educated about the importance of limiting  Carbohydrate intake , the need to commit to daily physical  activity for a minimum of 30 minutes , and to commit weight loss. The fact that changes in all these areas will reduce or eliminate all together the development of diabetes is stressed. Deteriorated , needs to change food choice      Latest Ref Rng & Units 06/17/2022   10:26 AM 01/04/2020    9:32 AM 12/24/2018   11:19 AM 04/15/2017   11:55 AM 05/15/2015    3:16 PM  Diabetic Labs  HbA1c 4.8 - 5.6 % 5.7  5.5  5.8  5.5    Chol 100 - 199 mg/dL 188  171  180   171   HDL >39 mg/dL 55  53  58   63   Calc LDL 0 - 99 mg/dL 121  106  110   96   Triglycerides 0 - 149 mg/dL 67  64  62   62   Creatinine 0.57 - 1.00 mg/dL 0.82  0.80  0.77  0.89  0.72       06/17/2022    3:52 PM 06/17/2022    3:40 PM 06/17/2022    3:01 PM 06/17/2022    2:55 PM 06/11/2022    2:23 PM 06/14/2020    3:21  PM 01/04/2020    8:09 AM  BP/Weight  Systolic BP 710 626 948 546  270 350  Diastolic BP 92 90 78 79  75 76  Wt. (Lbs)    285.04 258 263 257.12  BMI    44.64 kg/m2 40.41 kg/m2 41.19 kg/m2 40.27 kg/m2       No data to display            Osteoarthritis, knee Worse pain and reduced function with weight gain

## 2022-06-22 NOTE — Assessment & Plan Note (Signed)
Patient educated about the importance of limiting  Carbohydrate intake , the need to commit to daily physical activity for a minimum of 30 minutes , and to commit weight loss. The fact that changes in all these areas will reduce or eliminate all together the development of diabetes is stressed. Deteriorated , needs to change food choice      Latest Ref Rng & Units 06/17/2022   10:26 AM 01/04/2020    9:32 AM 12/24/2018   11:19 AM 04/15/2017   11:55 AM 05/15/2015    3:16 PM  Diabetic Labs  HbA1c 4.8 - 5.6 % 5.7  5.5  5.8  5.5    Chol 100 - 199 mg/dL 188  171  180   171   HDL >39 mg/dL 55  53  58   63   Calc LDL 0 - 99 mg/dL 121  106  110   96   Triglycerides 0 - 149 mg/dL 67  64  62   62   Creatinine 0.57 - 1.00 mg/dL 0.82  0.80  0.77  0.89  0.72       06/17/2022    3:52 PM 06/17/2022    3:40 PM 06/17/2022    3:01 PM 06/17/2022    2:55 PM 06/11/2022    2:23 PM 06/14/2020    3:21 PM 01/04/2020    8:09 AM  BP/Weight  Systolic BP 277 412 878 676  720 947  Diastolic BP 92 90 78 79  75 76  Wt. (Lbs)    285.04 258 263 257.12  BMI    44.64 kg/m2 40.41 kg/m2 41.19 kg/m2 40.27 kg/m2       No data to display

## 2022-06-25 ENCOUNTER — Ambulatory Visit
Admission: RE | Admit: 2022-06-25 | Discharge: 2022-06-25 | Disposition: A | Discharge status: Home / Self Care | Payer: Medicare HMO | Source: Ambulatory Visit | Attending: Family Medicine | Admitting: Family Medicine

## 2022-06-25 DIAGNOSIS — Z1231 Encounter for screening mammogram for malignant neoplasm of breast: Secondary | ICD-10-CM | POA: Diagnosis not present

## 2022-06-25 DIAGNOSIS — Z1239 Encounter for other screening for malignant neoplasm of breast: Secondary | ICD-10-CM

## 2022-07-11 DIAGNOSIS — M170 Bilateral primary osteoarthritis of knee: Secondary | ICD-10-CM | POA: Diagnosis not present

## 2022-07-17 ENCOUNTER — Ambulatory Visit: Payer: Medicare HMO | Admitting: Family Medicine

## 2022-07-22 ENCOUNTER — Encounter: Payer: Self-pay | Admitting: Family Medicine

## 2022-07-22 ENCOUNTER — Ambulatory Visit (INDEPENDENT_AMBULATORY_CARE_PROVIDER_SITE_OTHER): Payer: Medicare HMO | Admitting: Family Medicine

## 2022-07-22 VITALS — BP 110/70 | HR 79 | Ht 67.0 in | Wt 284.0 lb

## 2022-07-22 DIAGNOSIS — I10 Essential (primary) hypertension: Secondary | ICD-10-CM

## 2022-07-22 DIAGNOSIS — R7303 Prediabetes: Secondary | ICD-10-CM

## 2022-07-22 DIAGNOSIS — G8929 Other chronic pain: Secondary | ICD-10-CM

## 2022-07-22 DIAGNOSIS — M25561 Pain in right knee: Secondary | ICD-10-CM

## 2022-07-22 MED ORDER — PHENTERMINE HCL 37.5 MG PO TABS: 37.5000 mg | ORAL_TABLET | Freq: Every day | ORAL | 1 refills | Status: DC

## 2022-07-22 NOTE — Assessment & Plan Note (Signed)
Patient educated about the importance of limiting  Carbohydrate intake , the need to commit to daily physical activity for a minimum of 30 minutes , and to commit weight loss. The fact that changes in all these areas will reduce or eliminate all together the development of diabetes is stressed.      Latest Ref Rng & Units 06/17/2022   10:26 AM 01/04/2020    9:32 AM 12/24/2018   11:19 AM 04/15/2017   11:55 AM 05/15/2015    3:16 PM  Diabetic Labs  HbA1c 4.8 - 5.6 % 5.7  5.5  5.8  5.5    Chol 100 - 199 mg/dL 188  171  180   171   HDL >39 mg/dL 55  53  58   63   Calc LDL 0 - 99 mg/dL 121  106  110   96   Triglycerides 0 - 149 mg/dL 67  64  62   62   Creatinine 0.57 - 1.00 mg/dL 0.82  0.80  0.77  0.89  0.72       07/22/2022    4:36 PM 07/22/2022    4:17 PM 06/17/2022    3:52 PM 06/17/2022    3:40 PM 06/17/2022    3:01 PM 06/17/2022    2:55 PM 06/11/2022    2:23 PM  BP/Weight  Systolic BP A999333 123456 0000000 123456 A999333 [id number]   Diastolic BP 70 71 92 90 78 79   Wt. (Lbs)  284.04    285.04 258  BMI  44.49 kg/m2    44.64 kg/m2 40.41 kg/m2       No data to display

## 2022-07-22 NOTE — Assessment & Plan Note (Signed)
Controlled, no change in medication DASH diet and commitment to daily physical activity for a minimum of 30 minutes discussed and encouraged, as a part of hypertension management. The importance of attaining a healthy weight is also discussed.     07/22/2022    4:36 PM 07/22/2022    4:17 PM 06/17/2022    3:52 PM 06/17/2022    3:40 PM 06/17/2022    3:01 PM 06/17/2022    2:55 PM 06/11/2022    2:23 PM  BP/Weight  Systolic BP A999333 123456 0000000 123456 A999333 [id number]   Diastolic BP 70 71 92 90 78 79   Wt. (Lbs)  284.04    285.04 258  BMI  44.49 kg/m2    44.64 kg/m2 40.41 kg/m2

## 2022-07-22 NOTE — Assessment & Plan Note (Signed)
  Patient re-educated about  the importance of commitment to a  minimum of 150 minutes of exercise per week as able.  The importance of healthy food choices with portion control discussed, as well as eating regularly and within a 12 hour window most days. The need to choose "clean , green" food 50 to 75% of the time is discussed, as well as to make water the primary drink and set a goal of 64 ounces water daily.       07/22/2022    4:17 PM 06/17/2022    2:55 PM 06/11/2022    2:23 PM  Weight /BMI  Weight 284 lb 0.6 oz 285 lb 0.6 oz 258 lb  Height 5' 7"$  (1.702 m) 5' 7"$  (1.702 m) 5' 7"$  (1.702 m)  BMI 44.49 kg/m2 44.64 kg/m2 40.41 kg/m2    Start half phentermine re asess in 10 weeks

## 2022-07-22 NOTE — Progress Notes (Signed)
Suzanne Andrews     MRN: [id number]      DOB: Apr 02, 1966   HPI Suzanne Andrews is here for follow up and re-evaluation of chronic medical conditions, medication management and review of any available recent lab and radiology data.  Preventive health is updated, specifically  Cancer screening and Immunization.   Questions or concerns regarding consultations or procedures which the PT has had in the interim are  addressed. The PT denies any adverse reactions to current medications since the last visit.  There are no new concerns.  There are no specific complaints   ROS Denies recent fever or chills. Denies sinus pressure, nasal congestion, ear pain or sore throat. Denies chest congestion, productive cough or wheezing. Denies chest pains, palpitations and leg swelling Denies abdominal pain, nausea, vomiting,diarrhea or constipation.   Denies dysuria, frequency, hesitancy or incontinence. Denies joint pain, swelling and limitation in mobility. Denies headaches, seizures, numbness, or tingling. Denies depression, anxiety or insomnia. Denies skin break down or rash.   PE  BP 110/70   Pulse 79   Ht 5' 7"$  (1.702 m)   Wt 284 lb 0.6 oz (128.8 kg)   SpO2 97%   BMI 44.49 kg/m   Patient alert and oriented and in no cardiopulmonary distress.  HEENT: No facial asymmetry, EOMI,     Neck supple .  Chest: Clear to auscultation bilaterally.  CVS: S1, S2 no murmurs, no S3.Regular rate.  ABD: Soft non tender.   Ext: No edema  MS: Adequate ROM spine, shoulders, hips and knees.  Skin: Intact, no ulcerations or rash noted.  Psych: Good eye contact, normal affect. Memory intact not anxious or depressed appearing.  CNS: CN 2-12 intact, power,  normal throughout.no focal deficits noted.   Assessment & Plan  Essential hypertension Controlled, no change in medication DASH diet and commitment to daily physical activity for a minimum of 30 minutes discussed and encouraged, as a part of  hypertension management. The importance of attaining a healthy weight is also discussed.     07/22/2022    4:36 PM 07/22/2022    4:17 PM 06/17/2022    3:52 PM 06/17/2022    3:40 PM 06/17/2022    3:01 PM 06/17/2022    2:55 PM 06/11/2022    2:23 PM  BP/Weight  Systolic BP A999333 123456 0000000 123456 A999333 [id number]   Diastolic BP 70 71 92 90 78 79   Wt. (Lbs)  284.04    285.04 258  BMI  44.49 kg/m2    44.64 kg/m2 40.41 kg/m2       Morbid obesity (HCC)  Patient re-educated about  the importance of commitment to a  minimum of 150 minutes of exercise per week as able.  The importance of healthy food choices with portion control discussed, as well as eating regularly and within a 12 hour window most days. The need to choose "clean , green" food 50 to 75% of the time is discussed, as well as to make water the primary drink and set a goal of 64 ounces water daily.       07/22/2022    4:17 PM 06/17/2022    2:55 PM 06/11/2022    2:23 PM  Weight /BMI  Weight 284 lb 0.6 oz 285 lb 0.6 oz 258 lb  Height 5' 7"$  (1.702 m) 5' 7"$  (1.702 m) 5' 7"$  (1.702 m)  BMI 44.49 kg/m2 44.64 kg/m2 40.41 kg/m2    Start half phentermine re asess in 10 weeks  Prediabetes  Patient educated about the importance of limiting  Carbohydrate intake , the need to commit to daily physical activity for a minimum of 30 minutes , and to commit weight loss. The fact that changes in all these areas will reduce or eliminate all together the development of diabetes is stressed.      Latest Ref Rng & Units 06/17/2022   10:26 AM 01/04/2020    9:32 AM 12/24/2018   11:19 AM 04/15/2017   11:55 AM 05/15/2015    3:16 PM  Diabetic Labs  HbA1c 4.8 - 5.6 % 5.7  5.5  5.8  5.5    Chol 100 - 199 mg/dL 188  171  180   171   HDL >39 mg/dL 55  53  58   63   Calc LDL 0 - 99 mg/dL 121  106  110   96   Triglycerides 0 - 149 mg/dL 67  64  62   62   Creatinine 0.57 - 1.00 mg/dL 0.82  0.80  0.77  0.89  0.72       07/22/2022    4:36 PM 07/22/2022    4:17 PM  06/17/2022    3:52 PM 06/17/2022    3:40 PM 06/17/2022    3:01 PM 06/17/2022    2:55 PM 06/11/2022    2:23 PM  BP/Weight  Systolic BP A999333 123456 0000000 123456 A999333 [id number]   Diastolic BP 70 71 92 90 78 79   Wt. (Lbs)  284.04    285.04 258  BMI  44.49 kg/m2    44.64 kg/m2 40.41 kg/m2       No data to display            Right knee pain Surgery planned, pending weight loss

## 2022-07-22 NOTE — Patient Instructions (Signed)
Annual with pap in 12 to 14 weeks, call if yopu need me sooner  Blood p-ressure is great.  Take half phentermine once every morning to help with weight loss  Commit to eating 3 times per day on schedule, mainly veegetable, fruit, nO sugar please

## 2022-07-22 NOTE — Assessment & Plan Note (Signed)
Surgery planned, pending weight loss

## 2022-10-09 ENCOUNTER — Encounter: Payer: Self-pay | Admitting: Family Medicine

## 2022-10-09 ENCOUNTER — Ambulatory Visit (INDEPENDENT_AMBULATORY_CARE_PROVIDER_SITE_OTHER): Payer: Medicare HMO | Admitting: Family Medicine

## 2022-10-09 VITALS — BP 136/89 | HR 102 | Ht 67.0 in | Wt 266.0 lb

## 2022-10-09 DIAGNOSIS — I10 Essential (primary) hypertension: Secondary | ICD-10-CM | POA: Diagnosis not present

## 2022-10-09 DIAGNOSIS — R110 Nausea: Secondary | ICD-10-CM | POA: Insufficient documentation

## 2022-10-09 DIAGNOSIS — R1030 Lower abdominal pain, unspecified: Secondary | ICD-10-CM

## 2022-10-09 MED ORDER — DICYCLOMINE HCL 10 MG PO CAPS: ORAL_CAPSULE | ORAL | 0 refills | Status: DC

## 2022-10-09 MED ORDER — ONDANSETRON HCL 4 MG/2ML IJ SOLN
4.0000 mg | Freq: Once | INTRAMUSCULAR | Status: AC
Start: 2022-10-09 — End: 2022-10-09
  Administered 2022-10-09: 4 mg via INTRAMUSCULAR

## 2022-10-09 MED ORDER — ONDANSETRON HCL 4 MG PO TABS: 4.0000 mg | ORAL_TABLET | Freq: Three times a day (TID) | ORAL | 0 refills | Status: DC | PRN

## 2022-10-09 NOTE — Patient Instructions (Addendum)
Keep appointment next week for annual with pap please  Zofran 4 mg iM today in the office for nausea, and medication is prescribed to take at home for this if needed (zofran)  Prescription sent for stomach cramps for use as needed, (bentyl)  Congrats on weight loss, commit to eating 3 times per day healthy food choice  Thanks for choosing [facility], we consider it a privelige to serve you.

## 2022-10-09 NOTE — Assessment & Plan Note (Signed)
Zofran 4 mg iM and then at home as needed, short term

## 2022-10-09 NOTE — Progress Notes (Signed)
   Suzanne Andrews     MRN: [id number]      DOB: 1965/09/25  Chief Complaint  Patient presents with   Abdominal Pain    Stomach pain nausea acid reflux x 1 week reports eating a salad last week concerned it could be from that     HPI Ms. Griffieth is herewith above complaints Denies chang in stool  caliber  or pattern ROS Denies recent fever or chills. Denies sinus pressure, nasal congestion, ear pain or sore throat. Denies chest congestion, productive cough or wheezing. Denies chest pains, palpitations and leg swelling  Denies dysuria, frequency, hesitancy or incontinence. Chronic  joint pain, swelling and limitation in mobility. Denies headaches, seizures, numbness, or tingling. Denies depression, anxiety or insomnia. Denies skin break down or rash.   PE  BP 136/89 (BP Location: Right Arm, Patient Position: Sitting, Cuff Size: Large)   Pulse (!) 102   Ht 5\' 7"  (1.702 m)   Wt 266 lb (120.7 kg)   SpO2 94%   BMI 41.66 kg/m   Patient alert and oriented and in no cardiopulmonary distress.Uncomfortable and   HEENT: No facial asymmetry, EOMI,     Neck supple .  Chest: Clear to auscultation bilaterally.  CVS: S1, S2 no murmurs, no S3.Regular rate.  ABD: Soft non tender.   Ext: No edema  MS: Adequate ROM spine, shoulders, hips and knees.  Skin: Intact, no ulcerations or rash noted.  Psych: Good eye contact, normal affect. Memory intact not anxious or depressed appearing.  CNS: CN 2-12 intact, power,  normal throughout.no focal deficits noted.   Assessment & Plan  Nausea Zofran 4 mg iM and then at home as needed, short term  Abdominal pain Bentyl as needed short term prescribed  Essential hypertension DASH diet and commitment to daily physical activity for a minimum of 30 minutes discussed and encouraged, as a part of hypertension management. The importance of attaining a healthy weight is also discussed.     10/09/2022    2:10 PM 07/22/2022    4:36 PM  07/22/2022    4:17 PM 06/17/2022    3:52 PM 06/17/2022    3:40 PM 06/17/2022    3:01 PM 06/17/2022    2:55 PM  BP/Weight  Systolic BP 136 110 119 152 148 142 150  Diastolic BP 89 70 71 92 90 78 79  Wt. (Lbs) 266  284.04    285.04  BMI 41.66 kg/m2  44.49 kg/m2    44.64 kg/m2     Elevated at visit , will reassess when not in pain, generally controlled  Morbid obesity (HCC) Improved, she is congratulated on this  Patient re-educated about  the importance of commitment to a  minimum of 150 minutes of exercise per week as able.  The importance of healthy food choices with portion control discussed, as well as eating regularly and within a 12 hour window most days. The need to choose "clean , green" food 50 to 75% of the time is discussed, as well as to make water the primary drink and set a goal of 64 ounces water daily.       10/09/2022    2:10 PM 07/22/2022    4:17 PM 06/17/2022    2:55 PM  Weight /BMI  Weight 266 lb 284 lb 0.6 oz 285 lb 0.6 oz  Height 5\' 7"  (1.702 m) 5\' 7"  (1.702 m) 5\' 7"  (1.702 m)  BMI 41.66 kg/m2 44.49 kg/m2 44.64 kg/m2

## 2022-10-13 DIAGNOSIS — R109 Unspecified abdominal pain: Secondary | ICD-10-CM | POA: Insufficient documentation

## 2022-10-13 NOTE — Assessment & Plan Note (Signed)
DASH diet and commitment to daily physical activity for a minimum of 30 minutes discussed and encouraged, as a part of hypertension management. The importance of attaining a healthy weight is also discussed.     10/09/2022    2:10 PM 07/22/2022    4:36 PM 07/22/2022    4:17 PM 06/17/2022    3:52 PM 06/17/2022    3:40 PM 06/17/2022    3:01 PM 06/17/2022    2:55 PM  BP/Weight  Systolic BP 136 110 119 152 148 142 150  Diastolic BP 89 70 71 92 90 78 79  Wt. (Lbs) 266  284.04    285.04  BMI 41.66 kg/m2  44.49 kg/m2    44.64 kg/m2     Elevated at visit , will reassess when not in pain, generally controlled

## 2022-10-13 NOTE — Assessment & Plan Note (Signed)
Improved, she is congratulated on this  Patient re-educated about  the importance of commitment to a  minimum of 150 minutes of exercise per week as able.  The importance of healthy food choices with portion control discussed, as well as eating regularly and within a 12 hour window most days. The need to choose "clean , green" food 50 to 75% of the time is discussed, as well as to make water the primary drink and set a goal of 64 ounces water daily.       10/09/2022    2:10 PM 07/22/2022    4:17 PM 06/17/2022    2:55 PM  Weight /BMI  Weight 266 lb 284 lb 0.6 oz 285 lb 0.6 oz  Height 5\' 7"  (1.702 m) 5\' 7"  (1.702 m) 5\' 7"  (1.702 m)  BMI 41.66 kg/m2 44.49 kg/m2 44.64 kg/m2

## 2022-10-13 NOTE — Assessment & Plan Note (Signed)
Bentyl as needed short term prescribed

## 2022-10-14 ENCOUNTER — Encounter: Payer: Medicare HMO | Admitting: Family Medicine

## 2022-10-16 DIAGNOSIS — M1711 Unilateral primary osteoarthritis, right knee: Secondary | ICD-10-CM | POA: Diagnosis not present

## 2022-10-16 DIAGNOSIS — M1712 Unilateral primary osteoarthritis, left knee: Secondary | ICD-10-CM | POA: Diagnosis not present

## 2022-10-20 ENCOUNTER — Telehealth: Payer: Self-pay | Admitting: Family Medicine

## 2022-10-20 NOTE — Telephone Encounter (Signed)
Patient called said she was seen last week and has questions on medicine that was sent in for patient and asked for nurse to please return call [phone] today.

## 2022-10-22 ENCOUNTER — Other Ambulatory Visit: Payer: Self-pay | Admitting: Family Medicine

## 2022-10-22 ENCOUNTER — Encounter: Payer: Self-pay | Admitting: Family Medicine

## 2022-10-22 NOTE — Telephone Encounter (Signed)
Entered as  "allergy/ contraibndicated" and removed from med list pls let her know

## 2022-10-22 NOTE — Telephone Encounter (Signed)
Lvm letting patient know. 

## 2022-11-12 ENCOUNTER — Other Ambulatory Visit: Payer: Self-pay | Admitting: Family Medicine

## 2022-11-14 ENCOUNTER — Telehealth: Payer: Self-pay | Admitting: Family Medicine

## 2022-11-14 NOTE — Telephone Encounter (Signed)
Prescription Request  11/14/2022  LOV: 10/09/2022  What is the name of the medication or equipment? phentermine (ADIPEX-P) 37.5 MG tablet   Have you contacted your pharmacy to request a refill? Yes   Which pharmacy would you like this sent to?   [facility] [address] [place] - [address] [place] [address] Phone: [phone] Fax: [phone]      Patient notified that their request is being sent to the clinical staff for review and that they should receive a response within 2 business days.   Please advise at [facility] [phone]

## 2022-11-16 NOTE — Telephone Encounter (Signed)
Refill sent.

## 2022-11-27 ENCOUNTER — Ambulatory Visit (HOSPITAL_COMMUNITY)
Admission: RE | Admit: 2022-11-27 | Discharge: 2022-11-27 | Disposition: A | Discharge status: Home / Self Care | Payer: Medicare HMO | Source: Ambulatory Visit | Attending: Family Medicine | Admitting: Family Medicine

## 2022-11-27 ENCOUNTER — Ambulatory Visit (INDEPENDENT_AMBULATORY_CARE_PROVIDER_SITE_OTHER): Payer: Medicare HMO | Admitting: Family Medicine

## 2022-11-27 ENCOUNTER — Other Ambulatory Visit (HOSPITAL_COMMUNITY)
Admission: RE | Admit: 2022-11-27 | Discharge: 2022-11-27 | Disposition: A | Discharge status: Home / Self Care | Payer: Medicare HMO | Source: Ambulatory Visit | Attending: Family Medicine | Admitting: Family Medicine

## 2022-11-27 ENCOUNTER — Encounter: Payer: Self-pay | Admitting: Family Medicine

## 2022-11-27 VITALS — BP 120/80 | HR 81 | Ht 67.0 in | Wt 258.1 lb

## 2022-11-27 DIAGNOSIS — Z124 Encounter for screening for malignant neoplasm of cervix: Secondary | ICD-10-CM

## 2022-11-27 DIAGNOSIS — Z01818 Encounter for other preprocedural examination: Secondary | ICD-10-CM | POA: Diagnosis not present

## 2022-11-27 DIAGNOSIS — L602 Onychogryphosis: Secondary | ICD-10-CM | POA: Diagnosis not present

## 2022-11-27 DIAGNOSIS — Z1151 Encounter for screening for human papillomavirus (HPV): Secondary | ICD-10-CM | POA: Insufficient documentation

## 2022-11-27 DIAGNOSIS — I10 Essential (primary) hypertension: Secondary | ICD-10-CM | POA: Diagnosis not present

## 2022-11-27 DIAGNOSIS — Z0001 Encounter for general adult medical examination with abnormal findings: Secondary | ICD-10-CM

## 2022-11-27 DIAGNOSIS — R7303 Prediabetes: Secondary | ICD-10-CM | POA: Diagnosis not present

## 2022-11-27 DIAGNOSIS — Z01419 Encounter for gynecological examination (general) (routine) without abnormal findings: Secondary | ICD-10-CM | POA: Insufficient documentation

## 2022-11-27 DIAGNOSIS — Z1211 Encounter for screening for malignant neoplasm of colon: Secondary | ICD-10-CM

## 2022-11-27 DIAGNOSIS — B351 Tinea unguium: Secondary | ICD-10-CM | POA: Diagnosis not present

## 2022-11-27 LAB — POCT URINALYSIS DIP (CLINITEK)
Bilirubin, UA: NEGATIVE
Blood, UA: NEGATIVE
Glucose, UA: NEGATIVE mg/dL
Ketones, POC UA: NEGATIVE mg/dL
Leukocytes, UA: NEGATIVE
Nitrite, UA: NEGATIVE
POC PROTEIN,UA: NEGATIVE
Spec Grav, UA: 1.025 (ref 1.010–1.025)
Urobilinogen, UA: 0.2 E.U./dL
pH, UA: 7 (ref 5.0–8.0)

## 2022-11-27 MED ORDER — TERBINAFINE HCL 250 MG PO TABS: 250.0000 mg | ORAL_TABLET | Freq: Every day | ORAL | 1 refills | Status: DC

## 2022-11-27 MED ORDER — PHENTERMINE HCL 37.5 MG PO TABS: 37.5000 mg | ORAL_TABLET | Freq: Every day | ORAL | 0 refills | Status: DC

## 2022-11-27 NOTE — Assessment & Plan Note (Signed)
Severe bilateral onychomycosis Terbinafine 250 mg daily x 12 weeks

## 2022-11-27 NOTE — Patient Instructions (Signed)
Follow-up in 4 months, call if you need me sooner.  EKG clean-catch urinalysis in the office today.  Chest x-ray today.  Labs today CBC ,HbA1c and Chem-7.  New medication for fungal nail infection terbinafine 1 tablet daily for the next 12 weeks.  Please start taking your Claritin, I believe the puffiness under your eyes with itchy eyes is related to allergy symptoms and not to the medication you are taking.  Congratulations on weight loss continue same.  Cologuard test to be ordered please send the specimen for testing for colon cancer.  You need your tetanus shot Tdap please get this at your pharmacy as soon as possible, you also need your shingles vaccines please get these at your pharmacy as soon as possible.  All the best with your surgery.  Preop clearance is being completed by me at this visit.  Thanks for choosing [facility], we consider it a privelige to serve you.

## 2022-11-27 NOTE — Assessment & Plan Note (Signed)
Exam as documented  Urinalysis, CXr and EKG are  normal  Labs today will be reviewed and pt cleaared medically for proposed surgery

## 2022-11-27 NOTE — Assessment & Plan Note (Signed)

## 2022-11-27 NOTE — Progress Notes (Signed)
    Suzanne Andrews     MRN: [id number]      DOB: [birth year]  Chief Complaint  Patient presents with   Annual Exam    CPE/PAP SX CLEARANCE    HPI: Patient is in for annual physical exam. Also needs pre op clearance for right knee replacement C/o pain and swelling of right knee  limiting mobility , she is ready for surgery whenever called C/o thick and long toenails, wants help . Recent labs,  are reviewed. Immunization is reviewed , and  needs to be updated through her pharmacy   PE: BP 120/80   Pulse 81   Ht 5\' 7"  (1.702 m)   Wt 258 lb 1.9 oz (117.1 kg)   SpO2 97%   BMI 40.43 kg/m   Pleasant  female, alert and oriented x 3, in no cardio-pulmonary distress. Afebrile. HEENT No facial trauma or asymetry. Sinuses non tender.  Extra occullar muscles intact.. External ears normal, . Neck: supple, no adenopathy,JVD or thyromegaly.No bruits.  Chest: Clear to ascultation bilaterally.No crackles or wheezes. Non tender to palpation  Breast: No asymetry,no masses or lumps. No tenderness. No nipple discharge or inversion. No axillary or supraclavicular adenopathy  Cardiovascular system; Heart sounds normal,  S1 and  S2 ,no S3.  No murmur, or thrill. Apical beat not displaced Peripheral pulses normal.  Abdomen: Soft, non tender, no organomegaly or masses. No bruits. Bowel sounds normal. No guarding, tenderness or rebound.   GU: External genitalia normal female genitalia , normal female distribution of hair. No lesions. Urethral meatus normal in size, no  Prolapse, no lesions visibly  Present. Bladder non tender. Vagina pink and moist , with no visible lesions , discharge present . Adequate pelvic support no  cystocele or rectocele noted Cervix pink and appears healthy, no lesions or ulcerations noted, no discharge noted from os Uterus normal size, no adnexal masses, no cervical motion or adnexal tenderness.   Musculoskeletal exam: Full ROM of spine, hips ,  shoulders and reduced in  knees. deformity ,swelling and  crepitus noted.of knees , right worse than left No muscle wasting or atrophy.   Neurologic: Cranial nerves 2 to 12 intact. Power, tone ,sensation and reflexes normal throughout. No disturbance in gait. No tremor.  Skin: Intact, no ulceration, erythema , scaling or rash noted. Pigmentation normal throughout Severe bilateral onychomycosis with excessively long toenails   Psych; Normal mood and affect. Judgement and concentration normal   Assessment & Plan:  Annual visit for general adult medical examination with abnormal findings Annual exam as documented. Counseling done  re healthy lifestyle involving commitment to 150 minutes exercise per week, heart healthy diet, and attaining healthy weight.The importance of adequate sleep also discussed. Regular seat belt use and home safety, is also discussed. Changes in health habits are decided on by the patient with goals and time frames  set for achieving them. Immunization and cancer screening needs are specifically addressed at this visit.   Pre-op examination Exam as documented  Urinalysis, CXr and EKG are  normal  Labs today will be reviewed and pt cleaared medically for proposed surgery  Onychomycosis Severe bilateral onychomycosis Terbinafine 250 mg daily x 12 weeks

## 2022-11-28 LAB — BMP8+EGFR
BUN/Creatinine Ratio: 16 (ref 9–23)
BUN: 15 mg/dL (ref 6–24)
CO2: 25 mmol/L (ref 20–29)
Calcium: 10.3 mg/dL — ABNORMAL HIGH (ref 8.7–10.2)
Chloride: 101 mmol/L (ref 96–106)
Creatinine, Ser: 0.92 mg/dL (ref 0.57–1.00)
Glucose: 84 mg/dL (ref 70–99)
Potassium: 3.8 mmol/L (ref 3.5–5.2)
Sodium: 141 mmol/L (ref 134–144)
eGFR: 73 mL/min/{1.73_m2} (ref >59)

## 2022-11-28 LAB — CBC
Hematocrit: 38.7 % (ref 34.0–46.6)
Hemoglobin: 12.8 g/dL (ref 11.1–15.9)
MCH: 29.4 pg (ref 26.6–33.0)
MCHC: 33.1 g/dL (ref 31.5–35.7)
MCV: 89 fL (ref 79–97)
Platelets: 351 10*3/uL (ref 150–450)
RBC: 4.35 x10E6/uL (ref 3.77–5.28)
RDW: 12.6 % (ref 11.7–15.4)
WBC: 4.7 10*3/uL (ref 3.4–10.8)

## 2022-11-28 LAB — HEMOGLOBIN A1C
Est. average glucose Bld gHb Est-mCnc: 120 mg/dL
Hgb A1c MFr Bld: 5.8 % — ABNORMAL HIGH (ref 4.8–5.6)

## 2022-12-01 LAB — CYTOLOGY - PAP
Adequacy: ABSENT
Comment: NEGATIVE
Diagnosis: NEGATIVE
High risk HPV: NEGATIVE

## 2022-12-16 ENCOUNTER — Ambulatory Visit: Payer: Medicare HMO | Admitting: Podiatry

## 2022-12-16 DIAGNOSIS — Z1211 Encounter for screening for malignant neoplasm of colon: Secondary | ICD-10-CM | POA: Diagnosis not present

## 2022-12-24 LAB — COLOGUARD: COLOGUARD: NEGATIVE

## 2023-01-05 DIAGNOSIS — I739 Peripheral vascular disease, unspecified: Secondary | ICD-10-CM | POA: Diagnosis not present

## 2023-01-05 DIAGNOSIS — M79675 Pain in left toe(s): Secondary | ICD-10-CM | POA: Diagnosis not present

## 2023-01-05 DIAGNOSIS — M79672 Pain in left foot: Secondary | ICD-10-CM | POA: Diagnosis not present

## 2023-01-05 DIAGNOSIS — M79671 Pain in right foot: Secondary | ICD-10-CM | POA: Diagnosis not present

## 2023-01-05 DIAGNOSIS — L609 Nail disorder, unspecified: Secondary | ICD-10-CM | POA: Diagnosis not present

## 2023-01-05 DIAGNOSIS — M79674 Pain in right toe(s): Secondary | ICD-10-CM | POA: Diagnosis not present

## 2023-01-12 DIAGNOSIS — M1711 Unilateral primary osteoarthritis, right knee: Secondary | ICD-10-CM | POA: Diagnosis not present

## 2023-01-12 NOTE — H&P (Cosign Needed Addendum)
TOTAL KNEE ADMISSION H&P  Patient is being admitted for left total knee arthroplasty.  Subjective:  Chief Complaint: Left knee pain.  HPI: Suzanne Andrews, 57 y.o. female has a history of pain and functional disability in the left knee due to arthritis and has failed non-surgical conservative treatments for greater than 12 weeks to include NSAID's and/or analgesics and activity modification. Onset of symptoms was gradual, starting  several  years ago with gradually worsening course since that time. The patient noted no past surgery on the left knee.  Patient currently rates pain in the left knee at 8 out of 10 with activity. Patient has night pain, worsening of pain with activity and weight bearing, pain with passive range of motion, and crepitus. Patient has evidence of  advanced OA of bilateral knees left worse than right  by imaging studies. There is no active infection.  Patient Active Problem List   Diagnosis Date Noted   Annual visit for general adult medical examination with abnormal findings 11/27/2022   Pre-op examination 11/27/2022   Onychomycosis 11/27/2022   Nausea 10/09/2022   Screening due 06/14/2020   Back pain 08/06/2019   Venous insufficiency of both lower extremities 12/17/2016   Right knee pain 08/02/2015   Pain in joint, ankle and foot 05/15/2015   Ovarian cyst, left 04/04/2011   Prediabetes 04/04/2011   NEVI, MULTIPLE 08/02/2009   CAPILLARY HEMANGIOMA 08/02/2009   Osteoarthritis, knee 08/02/2009   ALLERGIC RHINITIS 11/21/2008   Morbid obesity (HCC) 02/01/2008   Essential hypertension 02/01/2008    Past Medical History:  Diagnosis Date   Hypertension    PVD (peripheral vascular disease) (HCC)     Past Surgical History:  Procedure Laterality Date   CESAREAN SECTION     x 2   CHOLECYSTECTOMY     KNEE ARTHROSCOPY WITH MEDIAL MENISECTOMY Left 07/18/2014   Procedure: LEFT KNEE ARTHROSCOPY WITH MEDIAL AND LATERAL MENISECTOMY;  Surgeon: Darreld Mclean, MD;   Location: [facility];  Service: Orthopedics;  Laterality: Left;   TUBAL LIGATION     vascular surgery bilateral leg      Prior to Admission medications   Medication Sig Start Date End Date Taking? Authorizing Provider  lisinopril-hydrochlorothiazide (ZESTORETIC) 20-25 MG tablet Take 1 tablet by mouth daily. 06/17/22   Kerri Perches, MD  loratadine (CLARITIN) 10 MG tablet Take 10 mg by mouth daily as needed for allergies.    [provider]  naproxen (NAPROSYN) 500 MG tablet TAKE 1 TABLET BY MOUTH TWICE DAILY WITH A MEAL AS NEEDED 06/14/20   Heather Roberts, NP  phentermine (ADIPEX-P) 37.5 MG tablet Take 1 tablet (37.5 mg total) by mouth daily before breakfast. 01/11/23   Kerri Perches, MD  terbinafine (LAMISIL) 250 MG tablet Take 1 tablet (250 mg total) by mouth daily. 11/27/22   Kerri Perches, MD    Allergies  Allergen Reactions   Septra [Sulfamethoxazole-Trimethoprim] Rash   Bentyl [Dicyclomine] Other (See Comments)    Reports caused floaters and constipation   Zofran [Ondansetron] Other (See Comments)    Reports caused floaters and constipation    Social History   Socioeconomic History   Marital status: Divorced    Spouse name: Not on file   Number of children: Not on file   Years of education: Not on file   Highest education level: Not on file  Occupational History   Not on file  Tobacco Use   Smoking status: Never   Smokeless tobacco: Never  Substance and  Sexual Activity   Alcohol use: No   Drug use: No   Sexual activity: Yes    Birth control/protection: Surgical  Other Topics Concern   Not on file  Social History Narrative   Not on file   Social Determinants of Health   Financial Resource Strain: Low Risk  (06/11/2022)   Overall Financial Resource Strain (CARDIA)    Difficulty of Paying Living Expenses: Not hard at all  Food Insecurity: No Food Insecurity (06/11/2022)   Hunger Vital Sign    Worried About Running Out of Food in the Last Year:  Never true    Ran Out of Food in the Last Year: Never true  Transportation Needs: No Transportation Needs (06/11/2022)   PRAPARE - Administrator, Civil Service (Medical): No    Lack of Transportation (Non-Medical): No  Physical Activity: Sufficiently Active (06/11/2022)   Exercise Vital Sign    Days of Exercise per Week: 3 days    Minutes of Exercise per Session: 50 min  Stress: No Stress Concern Present (06/11/2022)   [employer] of Occupational Health - Occupational Stress Questionnaire    Feeling of Stress : Not at all  Social Connections: Moderately Integrated (06/11/2022)   Social Connection and Isolation Panel [NHANES]    Frequency of Communication with Friends and Family: More than three times a week    Frequency of Social Gatherings with Friends and Family: Three times a week    Attends Religious Services: 1 to 4 times per year    Active Member of Clubs or Organizations: Yes    Attends Banker Meetings: 1 to 4 times per year    Marital Status: Divorced  Intimate Partner Violence: Not At Risk (06/11/2022)   Humiliation, Afraid, Rape, and Kick questionnaire    Fear of Current or Ex-Partner: No    Emotionally Abused: No    Physically Abused: No    Sexually Abused: No    Tobacco Use: Low Risk  (11/27/2022)   Patient History    Smoking Tobacco Use: Never    Smokeless Tobacco Use: Never    Passive Exposure: Not on file   Social History   Substance and Sexual Activity  Alcohol Use No    Family History  Problem Relation Age of Onset   Hypertension Mother    Cancer Father    Breast cancer Sister    Hypertension Sister    Cancer Sister        breast   Cancer Brother    Hypertension Brother    Cancer Brother     Review of Systems  Constitutional:  Negative for chills and fever.  HENT:  Negative for congestion, sore throat and tinnitus.   Eyes:  Negative for double vision, photophobia and pain.  Respiratory:  Negative for cough, shortness  of breath and wheezing.   Cardiovascular:  Negative for chest pain, palpitations and orthopnea.  Gastrointestinal:  Negative for heartburn, nausea and vomiting.  Genitourinary:  Negative for dysuria, frequency and urgency.  Musculoskeletal:  Positive for joint pain.  Neurological:  Negative for dizziness, weakness and headaches.    Objective:  Physical Exam: Well nourished and well developed.  General: Alert and oriented x3, cooperative and pleasant, no acute distress.  Head: normocephalic, atraumatic, neck supple.  Eyes: EOMI.  Musculoskeletal:  Left Knee Exam:  No effusion present. No swelling present.  The Range of motion is: 5 to 125 degrees.  Moderate crepitus on range of motion of the knee  Positive medial greater than lateral joint line tenderness.  The knee is stable.  Calves soft and nontender. Motor function intact in LE. Strength 5/5 LE bilaterally. Neuro: Distal pulses 2+. Sensation to light touch intact in LE.   Imaging Review Plain radiographs demonstrate severe degenerative joint disease of the left knee. The overall alignment is neutral. The bone quality appears to be adequate for age and reported activity level.  Assessment/Plan:  End stage arthritis, left knee   The patient history, physical examination, clinical judgment of the provider and imaging studies are consistent with end stage degenerative joint disease of the left knee and total knee arthroplasty is deemed medically necessary. The treatment options including medical management, injection therapy arthroscopy and arthroplasty were discussed at length. The risks and benefits of total knee arthroplasty were presented and reviewed. The risks due to aseptic loosening, infection, stiffness, patella tracking problems, thromboembolic complications and other imponderables were discussed. The patient acknowledged the explanation, agreed to proceed with the plan and consent was signed. Patient is being admitted for  inpatient treatment for surgery, pain control, PT, OT, prophylactic antibiotics, VTE prophylaxis, progressive ambulation and ADLs and discharge planning. The patient is planning to be discharged  home .   Patient's anticipated LOS is less than 2 midnights, meeting these requirements: - Younger than 65 - Lives within 1 hour of care - Has a competent adult at home to recover with post-op recover - NO history of  - Chronic pain requiring opiods  - Diabetes  - Coronary Artery Disease  - Heart failure  - Heart attack  - Stroke  - DVT/VTE  - Cardiac arrhythmia  - Respiratory Failure/COPD  - Renal failure  - Anemia  - Advanced Liver disease  Therapy Plans: Outpatient therapy at [facility]) Disposition: Home with daughters Planned DVT Prophylaxis: Aspirin 81 mg BID DME Needed: Dan Humphreys PCP: Syliva Overman, MD (clearance received) TXA: IV Allergies: Sulfa, tramadol Metal Allergy: None Anesthesia Concerns: None BMI: 39.7 Last HgbA1c: 5.8% (11/2022) Pain Regimen: Oxycodone, no tramadol* [facility]: [facility])  Other: - Wants SDD - Tresa Endo notified  - Patient was instructed on what medications to stop prior to surgery. - Follow-up visit in 2 weeks with Dr. Lequita Halt - Begin physical therapy following surgery - Pre-operative lab work as pre-surgical testing - Prescriptions will be provided in hospital at time of discharge  Arther Abbott, PA-C Orthopedic Surgery [facility] [place]

## 2023-01-19 ENCOUNTER — Other Ambulatory Visit: Payer: Self-pay | Admitting: Family Medicine

## 2023-01-23 NOTE — Progress Notes (Signed)
Anesthesia Review:  PCP: Syliva Overman- 6/27/24LOV clearance 11/28/22 on chart  Cardiologist : Chest x-ray : 11/27/22  EKG : 11/27/22  Echo : Stress test: Cardiac Cath :  Activity level:  Sleep Study/ CPAP : Fasting Blood Sugar :      / Checks Blood Sugar -- times a day:   Blood Thinner/ Instructions /Last Dose: ASA / Instructions/ Last Dose :

## 2023-01-26 NOTE — Patient Instructions (Signed)
SURGICAL WAITING ROOM VISITATION  Patients having surgery or a procedure may have no more than 2 support people in the waiting area - these visitors may rotate.    Children under the age of 86 must have an adult with them who is not the patient.  Due to an increase in RSV and influenza rates and associated hospitalizations, children ages 20 and under may not visit patients in [facility] hospitals.  If the patient needs to stay at the hospital during part of their recovery, the visitor guidelines for inpatient rooms apply. Pre-op nurse will coordinate an appropriate time for 1 support person to accompany patient in pre-op.  This support person may not rotate.    Please refer to the [facility] website for the visitor guidelines for Inpatients (after your surgery is over and you are in a regular room).       Your procedure is scheduled on: 02/09/23    Report to [facility] Main Entrance    Report to admitting at   0600AM   Call this number if you have problems the morning of surgery [phone]   Do not eat food :After Midnight.   After Midnight you may have the following liquids until __ 0515____ AM DAY OF SURGERY  Water Non-Citrus Juices (without pulp, NO RED-Apple, White grape, White cranberry) Black Coffee (NO MILK/CREAM OR CREAMERS, sugar ok)  Clear Tea (NO MILK/CREAM OR CREAMERS, sugar ok) regular and decaf                             Plain Jell-O (NO RED)                                           Fruit ices (not with fruit pulp, NO RED)                                     Popsicles (NO RED)                                                               Sports drinks like Gatorade (NO RED)                    The day of surgery:  Drink ONE (1) Pre-Surgery Clear Ensure or G2 at   0515AM( have completed by )  the morning of surgery. Drink in one sitting. Do not sip.  This drink was given to you during your hospital  pre-op appointment visit. Nothing else to drink  after completing the  Pre-Surgery Clear Ensure or G2.          If you have questions, please contact your surgeon's office.       Oral Hygiene is also important to reduce your risk of infection.                                    Remember - BRUSH YOUR TEETH THE MORNING OF SURGERY WITH YOUR REGULAR  TOOTHPASTE  DENTURES WILL BE REMOVED PRIOR TO SURGERY PLEASE DO NOT APPLY "Poly grip" OR ADHESIVES!!!   Do NOT smoke after Midnight   Stop all vitamins and herbal supplements 7 days before surgery.   Take these medicines the morning of surgery with A SIP OF WATER:  none   DO NOT TAKE ANY ORAL DIABETIC MEDICATIONS DAY OF YOUR SURGERY  Bring CPAP mask and tubing day of surgery.                              You may not have any metal on your body including hair pins, jewelry, and body piercing             Do not wear make-up, lotions, powders, perfumes/cologne, or deodorant  Do not wear nail polish including gel and S&S, artificial/acrylic nails, or any other type of covering on natural nails including finger and toenails. If you have artificial nails, gel coating, etc. that needs to be removed by a nail salon please have this removed prior to surgery or surgery may need to be canceled/ delayed if the surgeon/ anesthesia feels like they are unable to be safely monitored.   Do not shave  48 hours prior to surgery.               Men may shave face and neck.   Do not bring valuables to the hospital. [facility] IS NOT             RESPONSIBLE   FOR VALUABLES.   Contacts, glasses, dentures or bridgework may not be worn into surgery.   Bring small overnight bag day of surgery.   DO NOT BRING YOUR HOME MEDICATIONS TO THE HOSPITAL. PHARMACY WILL DISPENSE MEDICATIONS LISTED ON YOUR MEDICATION LIST TO YOU DURING YOUR ADMISSION IN THE HOSPITAL!    Patients discharged on the day of surgery will not be allowed to drive home.  Someone NEEDS to stay with you for the first 24 hours after  anesthesia.   Special Instructions: Bring a copy of your healthcare power of attorney and living will documents the day of surgery if you haven't scanned them before.              Please read over the following fact sheets you were given: IF YOU HAVE QUESTIONS ABOUT YOUR PRE-OP INSTRUCTIONS PLEASE CALL [phone]   If you received a COVID test during your pre-op visit  it is requested that you wear a mask when out in public, stay away from anyone that may not be feeling well and notify your surgeon if you develop symptoms. If you test positive for Covid or have been in contact with anyone that has tested positive in the last 10 days please notify you surgeon.      Pre-operative 5 CHG Bath Instructions   You can play a key role in reducing the risk of infection after surgery. Your skin needs to be as free of germs as possible. You can reduce the number of germs on your skin by washing with CHG (chlorhexidine gluconate) soap before surgery. CHG is an antiseptic soap that kills germs and continues to kill germs even after washing.   DO NOT use if you have an allergy to chlorhexidine/CHG or antibacterial soaps. If your skin becomes reddened or irritated, stop using the CHG and notify one of our RNs at [phone].   Please shower with the CHG soap starting 4 days  before surgery using the following schedule:     Please keep in mind the following:  DO NOT shave, including legs and underarms, starting the day of your first shower.   You may shave your face at any point before/day of surgery.  Place clean sheets on your bed the day you start using CHG soap. Use a clean washcloth (not used since being washed) for each shower. DO NOT sleep with pets once you start using the CHG.   CHG Shower Instructions:  If you choose to wash your hair and private area, wash first with your normal shampoo/soap.  After you use shampoo/soap, rinse your hair and body thoroughly to remove shampoo/soap residue.   Turn the water OFF and apply about 3 tablespoons (45 ml) of CHG soap to a CLEAN washcloth.  Apply CHG soap ONLY FROM YOUR NECK DOWN TO YOUR TOES (washing for 3-5 minutes)  DO NOT use CHG soap on face, private areas, open wounds, or sores.  Pay special attention to the area where your surgery is being performed.  If you are having back surgery, having someone wash your back for you may be helpful. Wait 2 minutes after CHG soap is applied, then you may rinse off the CHG soap.  Pat dry with a clean towel  Put on clean clothes/pajamas   If you choose to wear lotion, please use ONLY the CHG-compatible lotions on the back of this paper.     Additional instructions for the day of surgery: DO NOT APPLY any lotions, deodorants, cologne, or perfumes.   Put on clean/comfortable clothes.  Brush your teeth.  Ask your nurse before applying any prescription medications to the skin.      CHG Compatible Lotions   Aveeno Moisturizing lotion  Cetaphil Moisturizing Cream  Cetaphil Moisturizing Lotion  Clairol Herbal Essence Moisturizing Lotion, Dry Skin  Clairol Herbal Essence Moisturizing Lotion, Extra Dry Skin  Clairol Herbal Essence Moisturizing Lotion, Normal Skin  Curel Age Defying Therapeutic Moisturizing Lotion with Alpha Hydroxy  Curel Extreme Care Body Lotion  Curel Soothing Hands Moisturizing Hand Lotion  Curel Therapeutic Moisturizing Cream, Fragrance-Free  Curel Therapeutic Moisturizing Lotion, Fragrance-Free  Curel Therapeutic Moisturizing Lotion, Original Formula  Eucerin Daily Replenishing Lotion  Eucerin Dry Skin Therapy Plus Alpha Hydroxy Crme  Eucerin Dry Skin Therapy Plus Alpha Hydroxy Lotion  Eucerin Original Crme  Eucerin Original Lotion  Eucerin Plus Crme Eucerin Plus Lotion  Eucerin TriLipid Replenishing Lotion  Keri Anti-Bacterial Hand Lotion  Keri Deep Conditioning Original Lotion Dry Skin Formula Softly Scented  Keri Deep Conditioning Original Lotion, Fragrance  Free Sensitive Skin Formula  Keri Lotion Fast Absorbing Fragrance Free Sensitive Skin Formula  Keri Lotion Fast Absorbing Softly Scented Dry Skin Formula  Keri Original Lotion  Keri Skin Renewal Lotion Keri Silky Smooth Lotion  Keri Silky Smooth Sensitive Skin Lotion  Nivea Body Creamy Conditioning Oil  Nivea Body Extra Enriched Teacher, adult education Moisturizing Lotion Nivea Crme  Nivea Skin Firming Lotion  NutraDerm 30 Skin Lotion  NutraDerm Skin Lotion  NutraDerm Therapeutic Skin Cream  NutraDerm Therapeutic Skin Lotion  ProShield Protective Hand Cream  Provon moisturizing lotion

## 2023-01-27 ENCOUNTER — Encounter (HOSPITAL_COMMUNITY): Payer: Self-pay

## 2023-01-27 ENCOUNTER — Other Ambulatory Visit: Payer: Self-pay

## 2023-01-27 ENCOUNTER — Encounter (HOSPITAL_COMMUNITY)
Admission: RE | Admit: 2023-01-27 | Discharge: 2023-01-27 | Disposition: A | Discharge status: Home / Self Care | Payer: Medicare HMO | Source: Ambulatory Visit | Attending: Orthopedic Surgery | Admitting: Orthopedic Surgery

## 2023-01-27 VITALS — BP 137/84 | HR 72 | Temp 97.8°F | Resp 16 | Ht 67.0 in | Wt 243.0 lb

## 2023-01-27 DIAGNOSIS — Z01818 Encounter for other preprocedural examination: Secondary | ICD-10-CM | POA: Diagnosis not present

## 2023-01-27 DIAGNOSIS — Z01812 Encounter for preprocedural laboratory examination: Secondary | ICD-10-CM | POA: Diagnosis not present

## 2023-01-27 HISTORY — DX: Unspecified osteoarthritis, unspecified site: M19.90

## 2023-01-27 HISTORY — DX: Prediabetes: R73.03

## 2023-01-27 LAB — CBC
HCT: 39 % (ref 36.0–46.0)
Hemoglobin: 12.5 g/dL (ref 12.0–15.0)
MCH: 30.6 pg (ref 26.0–34.0)
MCHC: 32.1 g/dL (ref 30.0–36.0)
MCV: 95.6 fL (ref 80.0–100.0)
Platelets: 301 10*3/uL (ref 150–400)
RBC: 4.08 MIL/uL (ref 3.87–5.11)
RDW: 13.1 % (ref 11.5–15.5)
WBC: 5.6 10*3/uL (ref 4.0–10.5)
nRBC: 0 % (ref 0.0–0.2)

## 2023-01-27 LAB — BASIC METABOLIC PANEL
Anion gap: 11 (ref 5–15)
BUN: 16 mg/dL (ref 6–20)
CO2: 26 mmol/L (ref 22–32)
Calcium: 9.6 mg/dL (ref 8.9–10.3)
Chloride: 102 mmol/L (ref 98–111)
Creatinine, Ser: 0.85 mg/dL (ref 0.44–1.00)
GFR, Estimated: >60 mL/min (ref >60)
Glucose, Bld: 92 mg/dL (ref 70–99)
Potassium: 3.5 mmol/L (ref 3.5–5.1)
Sodium: 139 mmol/L (ref 135–145)

## 2023-01-27 LAB — SURGICAL PCR SCREEN
MRSA, PCR: NEGATIVE
Staphylococcus aureus: NEGATIVE

## 2023-02-09 ENCOUNTER — Encounter (HOSPITAL_COMMUNITY): Payer: Self-pay | Admitting: Orthopedic Surgery

## 2023-02-09 ENCOUNTER — Other Ambulatory Visit: Payer: Self-pay

## 2023-02-09 ENCOUNTER — Ambulatory Visit (HOSPITAL_COMMUNITY)
Admission: RE | Admit: 2023-02-09 | Discharge: 2023-02-09 | Disposition: A | Discharge status: Home / Self Care | Payer: Medicare HMO | Attending: Orthopedic Surgery | Admitting: Orthopedic Surgery

## 2023-02-09 ENCOUNTER — Ambulatory Visit (HOSPITAL_BASED_OUTPATIENT_CLINIC_OR_DEPARTMENT_OTHER): Payer: Medicare HMO | Admitting: Anesthesiology

## 2023-02-09 ENCOUNTER — Encounter (HOSPITAL_COMMUNITY)
Admission: RE | Disposition: A | Discharge status: Home / Self Care | Payer: Self-pay | Source: Home / Self Care | Attending: Orthopedic Surgery

## 2023-02-09 ENCOUNTER — Ambulatory Visit (HOSPITAL_COMMUNITY): Payer: Medicare HMO | Admitting: Physician Assistant

## 2023-02-09 DIAGNOSIS — M179 Osteoarthritis of knee, unspecified: Secondary | ICD-10-CM | POA: Diagnosis present

## 2023-02-09 DIAGNOSIS — I10 Essential (primary) hypertension: Secondary | ICD-10-CM

## 2023-02-09 DIAGNOSIS — Z01818 Encounter for other preprocedural examination: Secondary | ICD-10-CM

## 2023-02-09 DIAGNOSIS — M170 Bilateral primary osteoarthritis of knee: Secondary | ICD-10-CM | POA: Diagnosis not present

## 2023-02-09 DIAGNOSIS — M1712 Unilateral primary osteoarthritis, left knee: Secondary | ICD-10-CM

## 2023-02-09 DIAGNOSIS — Z96652 Presence of left artificial knee joint: Secondary | ICD-10-CM | POA: Diagnosis not present

## 2023-02-09 DIAGNOSIS — I739 Peripheral vascular disease, unspecified: Secondary | ICD-10-CM | POA: Diagnosis not present

## 2023-02-09 DIAGNOSIS — G8918 Other acute postprocedural pain: Secondary | ICD-10-CM | POA: Diagnosis not present

## 2023-02-09 HISTORY — PX: TOTAL KNEE ARTHROPLASTY: SHX125

## 2023-02-09 SURGERY — ARTHROPLASTY, KNEE, TOTAL
Anesthesia: Spinal | Site: Knee | Laterality: Left

## 2023-02-09 MED ORDER — PROMETHAZINE HCL 12.5 MG PO TABS: 12.5000 mg | ORAL_TABLET | Freq: Four times a day (QID) | ORAL | 0 refills | Status: DC | PRN

## 2023-02-09 MED ORDER — DEXAMETHASONE SODIUM PHOSPHATE 10 MG/ML IJ SOLN
INTRAMUSCULAR | Status: AC
  Filled 2023-02-09: qty 1

## 2023-02-09 MED ORDER — LACTATED RINGERS IV SOLN: INTRAVENOUS | Status: DC

## 2023-02-09 MED ORDER — CHLORHEXIDINE GLUCONATE 0.12 % MT SOLN
15.0000 mL | Freq: Once | OROMUCOSAL | Status: AC
  Administered 2023-02-09: 15 mL via OROMUCOSAL

## 2023-02-09 MED ORDER — OXYCODONE HCL 5 MG PO TABS: 5.0000 mg | ORAL_TABLET | Freq: Once | ORAL | Status: DC | PRN

## 2023-02-09 MED ORDER — BUPIVACAINE LIPOSOME 1.3 % IJ SUSP
20.0000 mL | Freq: Once | INTRAMUSCULAR | Status: AC
  Administered 2023-02-09: 10 mL

## 2023-02-09 MED ORDER — METHOCARBAMOL 500 MG PO TABS: 500.0000 mg | ORAL_TABLET | Freq: Four times a day (QID) | ORAL | Status: DC | PRN

## 2023-02-09 MED ORDER — SODIUM CHLORIDE 0.9 % IR SOLN
Status: DC | PRN
  Administered 2023-02-09: 1000 mL

## 2023-02-09 MED ORDER — PROPOFOL 10 MG/ML IV BOLUS
INTRAVENOUS | Status: AC
  Filled 2023-02-09: qty 20

## 2023-02-09 MED ORDER — DEXAMETHASONE SODIUM PHOSPHATE 10 MG/ML IJ SOLN
8.0000 mg | Freq: Once | INTRAMUSCULAR | Status: AC
  Administered 2023-02-09: 8 mg via INTRAVENOUS

## 2023-02-09 MED ORDER — BUPIVACAINE LIPOSOME 1.3 % IJ SUSP
INTRAMUSCULAR | Status: AC
  Filled 2023-02-09: qty 10

## 2023-02-09 MED ORDER — STERILE WATER FOR IRRIGATION IR SOLN
Status: DC | PRN
  Administered 2023-02-09: 2000 mL

## 2023-02-09 MED ORDER — BUPIVACAINE IN DEXTROSE 0.75-8.25 % IT SOLN
INTRATHECAL | Status: DC | PRN
  Administered 2023-02-09: 1.8 mL via INTRATHECAL

## 2023-02-09 MED ORDER — EPHEDRINE 5 MG/ML INJ
INTRAVENOUS | Status: AC
  Filled 2023-02-09: qty 5

## 2023-02-09 MED ORDER — POVIDONE-IODINE 10 % EX SWAB: 2.0000 | Freq: Once | CUTANEOUS | Status: DC

## 2023-02-09 MED ORDER — MIDAZOLAM HCL 2 MG/2ML IJ SOLN
1.0000 mg | INTRAMUSCULAR | Status: DC
  Administered 2023-02-09: 2 mg via INTRAVENOUS
  Filled 2023-02-09: qty 2

## 2023-02-09 MED ORDER — 0.9 % SODIUM CHLORIDE (POUR BTL) OPTIME
TOPICAL | Status: DC | PRN
  Administered 2023-02-09: 1000 mL

## 2023-02-09 MED ORDER — OXYCODONE HCL 5 MG PO TABS: 10.0000 mg | ORAL_TABLET | ORAL | Status: DC | PRN

## 2023-02-09 MED ORDER — OXYCODONE HCL 5 MG/5ML PO SOLN: 5.0000 mg | Freq: Once | ORAL | Status: DC | PRN

## 2023-02-09 MED ORDER — GABAPENTIN 300 MG PO CAPS: ORAL_CAPSULE | ORAL | 0 refills | Status: DC

## 2023-02-09 MED ORDER — SODIUM CHLORIDE 0.9 % IV SOLN
INTRAVENOUS | Status: DC | PRN
  Administered 2023-02-09: 40 mL

## 2023-02-09 MED ORDER — PHENYLEPHRINE 80 MCG/ML (10ML) SYRINGE FOR IV PUSH (FOR BLOOD PRESSURE SUPPORT)
PREFILLED_SYRINGE | INTRAVENOUS | Status: DC | PRN
  Administered 2023-02-09: 80 ug via INTRAVENOUS
  Administered 2023-02-09: 160 ug via INTRAVENOUS
  Administered 2023-02-09: 80 ug via INTRAVENOUS
  Administered 2023-02-09: 160 ug via INTRAVENOUS
  Administered 2023-02-09 (×5): 80 ug via INTRAVENOUS

## 2023-02-09 MED ORDER — PROPOFOL 10 MG/ML IV BOLUS
INTRAVENOUS | Status: DC | PRN
Start: 2023-02-09 — End: 2023-02-09
  Administered 2023-02-09: 30 mg via INTRAVENOUS

## 2023-02-09 MED ORDER — OXYCODONE HCL 5 MG PO TABS
5.0000 mg | ORAL_TABLET | ORAL | Status: DC | PRN
  Administered 2023-02-09: 5 mg via ORAL

## 2023-02-09 MED ORDER — CEFAZOLIN SODIUM-DEXTROSE 2-4 GM/100ML-% IV SOLN
2.0000 g | INTRAVENOUS | Status: AC
  Administered 2023-02-09: 2 g via INTRAVENOUS
  Filled 2023-02-09: qty 100

## 2023-02-09 MED ORDER — ASPIRIN 81 MG PO TBEC: DELAYED_RELEASE_TABLET | ORAL | 0 refills | Status: DC

## 2023-02-09 MED ORDER — FENTANYL CITRATE PF 50 MCG/ML IJ SOSY: 25.0000 ug | PREFILLED_SYRINGE | INTRAMUSCULAR | Status: DC | PRN

## 2023-02-09 MED ORDER — TRANEXAMIC ACID-NACL 1000-0.7 MG/100ML-% IV SOLN
1000.0000 mg | INTRAVENOUS | Status: AC
  Administered 2023-02-09: 1000 mg via INTRAVENOUS
  Filled 2023-02-09: qty 100

## 2023-02-09 MED ORDER — OXYCODONE HCL 5 MG PO TABS: 5.0000 mg | ORAL_TABLET | ORAL | 0 refills | Status: DC | PRN

## 2023-02-09 MED ORDER — METHOCARBAMOL 500 MG PO TABS: 500.0000 mg | ORAL_TABLET | Freq: Four times a day (QID) | ORAL | 0 refills | Status: DC | PRN

## 2023-02-09 MED ORDER — METHOCARBAMOL 500 MG IVPB - SIMPLE MED: 500.0000 mg | Freq: Four times a day (QID) | INTRAVENOUS | Status: DC | PRN

## 2023-02-09 MED ORDER — EPHEDRINE SULFATE-NACL 50-0.9 MG/10ML-% IV SOSY
PREFILLED_SYRINGE | INTRAVENOUS | Status: DC | PRN
Start: 2023-02-09 — End: 2023-02-09
  Administered 2023-02-09 (×3): 5 mg via INTRAVENOUS

## 2023-02-09 MED ORDER — ORAL CARE MOUTH RINSE: 15.0000 mL | Freq: Once | OROMUCOSAL | Status: AC

## 2023-02-09 MED ORDER — PROPOFOL 1000 MG/100ML IV EMUL
INTRAVENOUS | Status: AC
  Filled 2023-02-09: qty 100

## 2023-02-09 MED ORDER — OXYCODONE HCL 5 MG PO TABS
ORAL_TABLET | ORAL | Status: AC
  Filled 2023-02-09: qty 1

## 2023-02-09 MED ORDER — SODIUM CHLORIDE (PF) 0.9 % IJ SOLN
INTRAMUSCULAR | Status: AC
  Filled 2023-02-09: qty 50

## 2023-02-09 MED ORDER — PROPOFOL 500 MG/50ML IV EMUL
INTRAVENOUS | Status: DC | PRN
  Administered 2023-02-09: 125 ug/kg/min via INTRAVENOUS

## 2023-02-09 MED ORDER — ACETAMINOPHEN 10 MG/ML IV SOLN
1000.0000 mg | Freq: Four times a day (QID) | INTRAVENOUS | Status: DC
  Administered 2023-02-09: 1000 mg via INTRAVENOUS
  Filled 2023-02-09: qty 100

## 2023-02-09 MED ORDER — DROPERIDOL 2.5 MG/ML IJ SOLN: 0.6250 mg | Freq: Once | INTRAMUSCULAR | Status: DC | PRN

## 2023-02-09 MED ORDER — SODIUM CHLORIDE 0.9 % IV SOLN: INTRAVENOUS | Status: DC | PRN

## 2023-02-09 MED ORDER — ACETAMINOPHEN 10 MG/ML IV SOLN: 1000.0000 mg | Freq: Once | INTRAVENOUS | Status: DC | PRN

## 2023-02-09 MED ORDER — PHENYLEPHRINE 80 MCG/ML (10ML) SYRINGE FOR IV PUSH (FOR BLOOD PRESSURE SUPPORT)
PREFILLED_SYRINGE | INTRAVENOUS | Status: AC
  Filled 2023-02-09: qty 10

## 2023-02-09 MED ORDER — FENTANYL CITRATE PF 50 MCG/ML IJ SOSY
50.0000 ug | PREFILLED_SYRINGE | INTRAMUSCULAR | Status: DC
  Administered 2023-02-09: 50 ug via INTRAVENOUS
  Filled 2023-02-09: qty 2

## 2023-02-09 MED ORDER — PHENYLEPHRINE HCL-NACL 20-0.9 MG/250ML-% IV SOLN
INTRAVENOUS | Status: AC
  Filled 2023-02-09: qty 250

## 2023-02-09 MED ORDER — CEFAZOLIN SODIUM-DEXTROSE 2-4 GM/100ML-% IV SOLN: 2.0000 g | Freq: Four times a day (QID) | INTRAVENOUS | Status: DC

## 2023-02-09 MED ORDER — BUPIVACAINE HCL (PF) 0.5 % IJ SOLN
INTRAMUSCULAR | Status: DC | PRN
Start: 2023-02-09 — End: 2023-02-09
  Administered 2023-02-09: 10 mL

## 2023-02-09 SURGICAL SUPPLY — 55 items
ADH SKN CLS APL DERMABOND .7 (GAUZE/BANDAGES/DRESSINGS) ×1
ATTUNE PS FEM LT SZ 6 CEM KNEE (Femur) IMPLANT
BAG COUNTER SPONGE SURGICOUNT (BAG) IMPLANT
BAG SPEC THK2 15X12 ZIP CLS (MISCELLANEOUS) ×1
BAG SPNG CNTER NS LX DISP (BAG)
BAG ZIPLOCK 12X15 (MISCELLANEOUS) ×1 IMPLANT
BASE TIBIA ATTUNE KNEE SYS SZ6 (Knees) IMPLANT
BLADE SAG 18X100X1.27 (BLADE) ×1 IMPLANT
BLADE SAW SGTL 11.0X1.19X90.0M (BLADE) ×1 IMPLANT
BNDG CMPR 6 X 5 YARDS HK CLSR (GAUZE/BANDAGES/DRESSINGS) ×1
BNDG ELASTIC 6INX 5YD STR LF (GAUZE/BANDAGES/DRESSINGS) ×1 IMPLANT
BOWL SMART MIX CTS (DISPOSABLE) ×1 IMPLANT
BSPLAT TIB 6 CMNT ROT PLAT STR (Knees) ×1 IMPLANT
CEMENT HV SMART SET (Cement) ×2 IMPLANT
COVER SURGICAL LIGHT HANDLE (MISCELLANEOUS) ×1 IMPLANT
CUFF TOURN SGL QUICK 34 (TOURNIQUET CUFF)
CUFF TOURN SGL QUICK 42 (TOURNIQUET CUFF) IMPLANT
CUFF TRNQT CYL 34X4.125X (TOURNIQUET CUFF) ×1 IMPLANT
DERMABOND ADVANCED .7 DNX12 (GAUZE/BANDAGES/DRESSINGS) ×1 IMPLANT
DRAPE U-SHAPE 47X51 STRL (DRAPES) ×1 IMPLANT
DRSG AQUACEL AG ADV 3.5X10 (GAUZE/BANDAGES/DRESSINGS) ×1 IMPLANT
DURAPREP 26ML APPLICATOR (WOUND CARE) ×1 IMPLANT
ELECT REM PT RETURN 15FT ADLT (MISCELLANEOUS) ×1 IMPLANT
GLOVE BIO SURGEON STRL SZ 6.5 (GLOVE) IMPLANT
GLOVE BIO SURGEON STRL SZ8 (GLOVE) ×1 IMPLANT
GLOVE BIOGEL PI IND STRL 6.5 (GLOVE) IMPLANT
GLOVE BIOGEL PI IND STRL 7.0 (GLOVE) IMPLANT
GLOVE BIOGEL PI IND STRL 8 (GLOVE) ×1 IMPLANT
GOWN STRL REUS W/ TWL LRG LVL3 (GOWN DISPOSABLE) ×1 IMPLANT
GOWN STRL REUS W/TWL LRG LVL3 (GOWN DISPOSABLE) ×1
HANDPIECE INTERPULSE COAX TIP (DISPOSABLE) ×1
HOLDER FOLEY CATH W/STRAP (MISCELLANEOUS) IMPLANT
IMMOBILIZER KNEE 20 (SOFTGOODS) ×1
IMMOBILIZER KNEE 20 THIGH 36 (SOFTGOODS) ×1 IMPLANT
INSERT TIB ATTUNE RP SZ6X16 (Insert) IMPLANT
KIT TURNOVER KIT A (KITS) IMPLANT
MANIFOLD NEPTUNE II (INSTRUMENTS) ×1 IMPLANT
NS IRRIG 1000ML POUR BTL (IV SOLUTION) ×1 IMPLANT
PACK TOTAL KNEE CUSTOM (KITS) ×1 IMPLANT
PADDING CAST COTTON 6X4 STRL (CAST SUPPLIES) ×2 IMPLANT
PATELLA MEDIAL ATTUN 35MM KNEE (Knees) IMPLANT
PIN STEINMAN FIXATION KNEE (PIN) IMPLANT
PROTECTOR NERVE ULNAR (MISCELLANEOUS) ×1 IMPLANT
SET HNDPC FAN SPRY TIP SCT (DISPOSABLE) ×1 IMPLANT
SPIKE FLUID TRANSFER (MISCELLANEOUS) ×1 IMPLANT
SUT MNCRL AB 4-0 PS2 18 (SUTURE) ×1 IMPLANT
SUT STRATAFIX 0 PDS 27 VIOLET (SUTURE) ×1
SUT VIC AB 2-0 CT1 27 (SUTURE) ×3
SUT VIC AB 2-0 CT1 TAPERPNT 27 (SUTURE) ×3 IMPLANT
SUTURE STRATFX 0 PDS 27 VIOLET (SUTURE) ×1 IMPLANT
TIBIA ATTUNE KNEE SYS BASE SZ6 (Knees) ×1 IMPLANT
TRAY FOLEY MTR SLVR 16FR STAT (SET/KITS/TRAYS/PACK) ×1 IMPLANT
TUBE SUCTION HIGH CAP CLEAR NV (SUCTIONS) ×1 IMPLANT
WATER STERILE IRR 1000ML POUR (IV SOLUTION) ×2 IMPLANT
WRAP KNEE MAXI GEL POST OP (GAUZE/BANDAGES/DRESSINGS) ×1 IMPLANT

## 2023-02-09 NOTE — Interval H&P Note (Signed)
History and Physical Interval Note:  02/09/2023 6:36 AM  Suzanne Andrews  has presented today for surgery, with the diagnosis of left knee osteoarthritis.  The various methods of treatment have been discussed with the patient and family. After consideration of risks, benefits and other options for treatment, the patient has consented to  Procedure(s): TOTAL KNEE ARTHROPLASTY (Left) as a surgical intervention.  The patient's history has been reviewed, patient examined, no change in status, stable for surgery.  I have reviewed the patient's chart and labs.  Questions were answered to the patient's satisfaction.     Homero Fellers Emily Forse

## 2023-02-09 NOTE — Anesthesia Procedure Notes (Signed)
Anesthesia Regional Block: Adductor canal block   Pre-Anesthetic Checklist: , timeout performed,  Correct Patient, Correct Site, Correct Laterality,  Correct Procedure, Correct Position, site marked,  Risks and benefits discussed,  Surgical consent,  Pre-op evaluation,  At surgeon's request and post-op pain management  Laterality: Left  Prep: Maximum Sterile Barrier Precautions used, chloraprep       Needles:  Injection technique: Single-shot  Needle Type: Echogenic Needle      Needle Gauge: 20     Additional Needles:   Procedures:,,,, ultrasound used (permanent image in chart),,    Narrative:  Start time: 02/09/2023 7:50 AM End time: 02/09/2023 7:55 AM Injection made incrementally with aspirations every 5 mL.  Performed by: Personally  Anesthesiologist: Mariann Barter, MD

## 2023-02-09 NOTE — Anesthesia Postprocedure Evaluation (Signed)
Anesthesia Post Note  Patient: Suzanne Andrews  Procedure(s) Performed: TOTAL KNEE ARTHROPLASTY (Left: Knee)     Patient location during evaluation: PACU Anesthesia Type: Spinal Level of consciousness: oriented and awake and alert Pain management: pain level controlled Vital Signs Assessment: post-procedure vital signs reviewed and stable Respiratory status: spontaneous breathing, respiratory function stable and patient connected to nasal cannula oxygen Cardiovascular status: blood pressure returned to baseline and stable Postop Assessment: no headache, no backache and no apparent nausea or vomiting Anesthetic complications: no   No notable events documented.  Last Vitals:  Vitals:   02/09/23 1200 02/09/23 1228  BP: (!) 134/90 128/88  Pulse: 78 77  Resp: 20 14  Temp: (!) 36 C 36.6 C  SpO2: 100% 100%    Last Pain:  Vitals:   02/09/23 1228  TempSrc: Temporal  PainSc: 0-No pain                 Mariann Barter

## 2023-02-09 NOTE — Anesthesia Procedure Notes (Signed)
Procedure Name: MAC Date/Time: 02/09/2023 8:19 AM  Performed by: Laurita Quint, CRNAPre-anesthesia Checklist: Patient identified, Emergency Drugs available, Suction available and Patient being monitored Oxygen Delivery Method: Simple face mask

## 2023-02-09 NOTE — Discharge Instructions (Signed)
 Frank Aluisio, MD Total Joint Specialist [facility] [place] [address] [place], [place] [address] [phone]  TOTAL KNEE REPLACEMENT POSTOPERATIVE DIRECTIONS    Knee Rehabilitation, Guidelines Following Surgery  Results after knee surgery are often greatly improved when you follow the exercise, range of motion and muscle strengthening exercises prescribed by your doctor. Safety measures are also important to protect the knee from further injury. If any of these exercises cause you to have increased pain or swelling in your knee joint, decrease the amount until you are comfortable again and slowly increase them. If you have problems or questions, call your caregiver or physical therapist for advice.   BLOOD CLOT PREVENTION Take 81 mg Aspirin two times a day for three weeks following surgery. Then take an 81 mg Aspirin once a day for three weeks. Then discontinue Aspirin. You may resume your vitamins/supplements upon discharge from the hospital. Do not take any NSAIDs (Advil, Aleve, Ibuprofen, Meloxicam, etc.) for 3 weeks, while taking 81mg Aspirin twice a day.   HOME CARE INSTRUCTIONS  Remove items at home which could result in a fall. This includes throw rugs or furniture in walking pathways.  ICE to the affected knee as much as tolerated. Icing helps control swelling. If the swelling is well controlled you will be more comfortable and rehab easier. Continue to use ice on the knee for pain and swelling from surgery. You may notice swelling that will progress down to the foot and ankle. This is normal after surgery. Elevate the leg when you are not up walking on it.    Continue to use the breathing machine which will help keep your temperature down. It is common for your temperature to cycle up and down following surgery, especially at night when you are not up moving around and exerting yourself. The breathing machine keeps your lungs expanded and your temperature  down. Do not place pillow under the operative knee, focus on keeping the knee straight while resting  DIET You may resume your previous home diet once you are discharged from the hospital.  DRESSING / WOUND CARE / SHOWERING Keep your bulky bandage on for 2 days. On the third post-operative day you may remove the Ace bandage and gauze. There is a waterproof adhesive bandage on your skin which will stay in place until your first follow-up appointment. Once you remove this you will not need to place another bandage You may begin showering 3 days following surgery, but do not submerge the incision under water.  ACTIVITY For the first 5 days, the key is rest and control of pain and swelling Do your home exercises twice a day starting on post-operative day 3. On the days you go to physical therapy, just do the home exercises once that day. You should rest, ice and elevate the leg for 50 minutes out of every hour. Get up and walk/stretch for 10 minutes per hour. After 5 days you can increase your activity slowly as tolerated. Walk with your walker as instructed. Use the walker until you are comfortable transitioning to a cane. Walk with the cane in the opposite hand of the operative leg. You may discontinue the cane once you are comfortable and walking steadily. Avoid periods of inactivity such as sitting longer than an hour when not asleep. This helps prevent blood clots.  You may discontinue the knee immobilizer once you are able to perform a straight leg raise while lying down. You may resume a sexual relationship in   one month or when given the OK by your doctor.  You may return to work once you are cleared by your doctor.  Do not drive a car for 6 weeks or until released by your surgeon.  Do not drive while taking narcotics.  TED HOSE STOCKINGS Wear the elastic stockings on both legs for three weeks following surgery during the day. You may remove them at night for sleeping.  WEIGHT  BEARING Weight bearing as tolerated with assist device (walker, cane, etc) as directed, use it as long as suggested by your surgeon or therapist, typically at least 4-6 weeks.  POSTOPERATIVE CONSTIPATION PROTOCOL Constipation - defined medically as fewer than three stools per week and severe constipation as less than one stool per week.  One of the most common issues patients have following surgery is constipation.  Even if you have a regular bowel pattern at home, your normal regimen is likely to be disrupted due to multiple reasons following surgery.  Combination of anesthesia, postoperative narcotics, change in appetite and fluid intake all can affect your bowels.  In order to avoid complications following surgery, here are some recommendations in order to help you during your recovery period.  Colace (docusate) - Pick up an over-the-counter form of Colace or another stool softener and take twice a day as long as you are requiring postoperative pain medications.  Take with a full glass of water daily.  If you experience loose stools or diarrhea, hold the colace until you stool forms back up. If your symptoms do not get better within 1 week or if they get worse, check with your doctor. Dulcolax (bisacodyl) - Pick up over-the-counter and take as directed by the product packaging as needed to assist with the movement of your bowels.  Take with a full glass of water.  Use this product as needed if not relieved by Colace only.  MiraLax (polyethylene glycol) - Pick up over-the-counter to have on hand. MiraLax is a solution that will increase the amount of water in your bowels to assist with bowel movements.  Take as directed and can mix with a glass of water, juice, soda, coffee, or tea. Take if you go more than two days without a movement. Do not use MiraLax more than once per day. Call your doctor if you are still constipated or irregular after using this medication for 7 days in a row.  If you continue  to have problems with postoperative constipation, please contact the office for further assistance and recommendations.  If you experience "the worst abdominal pain ever" or develop nausea or vomiting, please contact the office immediatly for further recommendations for treatment.  ITCHING If you experience itching with your medications, try taking only a single pain pill, or even half a pain pill at a time.  You can also use Benadryl over the counter for itching or also to help with sleep.   MEDICATIONS See your medication summary on the "After Visit Summary" that the nursing staff will review with you prior to discharge.  You may have some home medications which will be placed on hold until you complete the course of blood thinner medication.  It is important for you to complete the blood thinner medication as prescribed by your surgeon.  Continue your approved medications as instructed at time of discharge.  PRECAUTIONS If you experience chest pain or shortness of breath - call 911 immediately for transfer to the hospital emergency department.  If you develop a fever greater that   101 F, purulent drainage from wound, increased redness or drainage from wound, foul odor from the wound/dressing, or calf pain - CONTACT YOUR SURGEON.                                                   FOLLOW-UP APPOINTMENTS Make sure you keep all of your appointments after your operation with your surgeon and caregivers. You should call the office at the above phone number and make an appointment for approximately two weeks after the date of your surgery or on the date instructed by your surgeon outlined in the "After Visit Summary".  RANGE OF MOTION AND STRENGTHENING EXERCISES  Rehabilitation of the knee is important following a knee injury or an operation. After just a few days of immobilization, the muscles of the thigh which control the knee become weakened and shrink (atrophy). Knee exercises are designed to build up  the tone and strength of the thigh muscles and to improve knee motion. Often times heat used for twenty to thirty minutes before working out will loosen up your tissues and help with improving the range of motion but do not use heat for the first two weeks following surgery. These exercises can be done on a training (exercise) mat, on the floor, on a table or on a bed. Use what ever works the best and is most comfortable for you Knee exercises include:  Leg Lifts - While your knee is still immobilized in a splint or cast, you can do straight leg raises. Lift the leg to 60 degrees, hold for 3 sec, and slowly lower the leg. Repeat 10-20 times 2-3 times daily. Perform this exercise against resistance later as your knee gets better.  Quad and Hamstring Sets - Tighten up the muscle on the front of the thigh (Quad) and hold for 5-10 sec. Repeat this 10-20 times hourly. Hamstring sets are done by pushing the foot backward against an object and holding for 5-10 sec. Repeat as with quad sets.  Leg Slides: Lying on your back, slowly slide your foot toward your buttocks, bending your knee up off the floor (only go as far as is comfortable). Then slowly slide your foot back down until your leg is flat on the floor again. Angel Wings: Lying on your back spread your legs to the side as far apart as you can without causing discomfort.  A rehabilitation program following serious knee injuries can speed recovery and prevent re-injury in the future due to weakened muscles. Contact your doctor or a physical therapist for more information on knee rehabilitation.   POST-OPERATIVE OPIOID TAPER INSTRUCTIONS: It is important to wean off of your opioid medication as soon as possible. If you do not need pain medication after your surgery it is ok to stop day one. Opioids include: Codeine, Hydrocodone(Norco, Vicodin), Oxycodone(Percocet, oxycontin) and hydromorphone amongst others.  Long term and even short term use of opiods can  cause: Increased pain response Dependence Constipation Depression Respiratory depression And more.  Withdrawal symptoms can include Flu like symptoms Nausea, vomiting And more Techniques to manage these symptoms Hydrate well Eat regular healthy meals Stay active Use relaxation techniques(deep breathing, meditating, yoga) Do Not substitute Alcohol to help with tapering If you have been on opioids for less than two weeks and do not have pain than it is ok to stop all together.    Plan to wean off of opioids This plan should start within one week post op of your joint replacement. Maintain the same interval or time between taking each dose and first decrease the dose.  Cut the total daily intake of opioids by one tablet each day Next start to increase the time between doses. The last dose that should be eliminated is the evening dose.   IF YOU ARE TRANSFERRED TO A SKILLED REHAB FACILITY If the patient is transferred to a skilled rehab facility following release from the hospital, a list of the current medications will be sent to the facility for the patient to continue.  When discharged from the skilled rehab facility, please have the facility set up the patient's Home Health Physical Therapy prior to being released. Also, the skilled facility will be responsible for providing the patient with their medications at time of release from the facility to include their pain medication, the muscle relaxants, and their blood thinner medication. If the patient is still at the rehab facility at time of the two week follow up appointment, the skilled rehab facility will also need to assist the patient in arranging follow up appointment in our office and any transportation needs.  MAKE SURE YOU:  Understand these instructions.  Get help right away if you are not doing well or get worse.   DENTAL ANTIBIOTICS:  In most cases prophylactic antibiotics for Dental procdeures after total joint surgery are  not necessary.  Exceptions are as follows:  1. History of prior total joint infection  2. Severely immunocompromised (Organ Transplant, cancer chemotherapy, Rheumatoid biologic meds such as Humera)  3. Poorly controlled diabetes (A1C &gt; 8.0, blood glucose over 200)  If you have one of these conditions, contact your surgeon for an antibiotic prescription, prior to your dental procedure.    Pick up stool softner and laxative for home use following surgery while on pain medications. Do not submerge incision under water. Please use good hand washing techniques while changing dressing each day. May shower starting three days after surgery. Please use a clean towel to pat the incision dry following showers. Continue to use ice for pain and swelling after surgery. Do not use any lotions or creams on the incision until instructed by your surgeon.  

## 2023-02-09 NOTE — Op Note (Signed)
OPERATIVE REPORT-TOTAL KNEE ARTHROPLASTY   Pre-operative diagnosis- Osteoarthritis  Left knee(s)  Post-operative diagnosis- Osteoarthritis Left knee(s)  Procedure-  Left  Total Knee Arthroplasty  Surgeon- Gus Rankin. Loucinda Croy, MD  Assistant- Weston Brass, PA-C   Anesthesia-   Adductor canal block and spinal  EBL- 25 ml   Drains None  Tourniquet time-  Total Tourniquet Time Documented: Thigh (Left) - 47 minutes Total: Thigh (Left) - 47 minutes     Complications- None  Condition-PACU - hemodynamically stable.   Brief Clinical Note  Suzanne Andrews is a 57 y.o. year old female with end stage OA of her left knee with progressively worsening pain and dysfunction. She has constant pain, with activity and at rest and significant functional deficits with difficulties even with ADLs. She has had extensive non-op management including analgesics, injections of cortisone and viscosupplements, and home exercise program, but remains in significant pain with significant dysfunction. Radiographs show bone on bone arthritis all 3 compartments. She presents now for left Total Knee Arthroplasty.     Procedure in detail---   The patient is brought into the operating room and positioned supine on the operating table. After successful administration of  Adductor canal block and spinal,   a tourniquet is placed high on the  Left thigh(s) and the lower extremity is prepped and draped in the usual sterile fashion. Time out is performed by the operating team and then the  Left lower extremity is wrapped in Esmarch, knee flexed and the tourniquet inflated to 300 mmHg.       A midline incision is made with a ten blade through the subcutaneous tissue to the level of the extensor mechanism. A fresh blade is used to make a medial parapatellar arthrotomy. Soft tissue over the proximal medial tibia is subperiosteally elevated to the joint line with a knife and into the semimembranosus bursa with a Cobb  elevator. Soft tissue over the proximal lateral tibia is elevated with attention being paid to avoiding the patellar tendon on the tibial tubercle. The patella is everted, knee flexed 90 degrees and the ACL and PCL are removed. Findings are bone on bone all 3 compartments with massive global osteophytes.        The drill is used to create a starting hole in the distal femur and the canal is thoroughly irrigated with sterile saline to remove the fatty contents. The 5 degree Left  valgus alignment guide is placed into the femoral canal and the distal femoral cutting block is pinned to remove 9 mm off the distal femur. Resection is made with an oscillating saw.      The tibia is subluxed forward and the menisci are removed. The extramedullary alignment guide is placed referencing proximally at the medial aspect of the tibial tubercle and distally along the second metatarsal axis and tibial crest. The block is pinned to remove 2mm off the more deficient medial  side. Resection is made with an oscillating saw. Size 6is the most appropriate size for the tibia and the proximal tibia is prepared with the modular drill and keel punch for that size.      The femoral sizing guide is placed and size 6 is most appropriate. Rotation is marked off the epicondylar axis and confirmed by creating a rectangular flexion gap at 90 degrees. The size 6 cutting block is pinned in this rotation and the anterior, posterior and chamfer cuts are made with the oscillating saw. The intercondylar block is then placed and that cut  is made.      Trial size 6 tibial component, trial size 6 posterior stabilized femur and a 16  mm posterior stabilized rotating platform insert trial is placed. Full extension is achieved with excellent varus/valgus and anterior/posterior balance throughout full range of motion. The patella is everted and thickness measured to be 22  mm. Free hand resection is taken to 12 mm, a 35 template is placed, lug holes are  drilled, trial patella is placed, and it tracks normally. Osteophytes are removed off the posterior femur with the trial in place. All trials are removed and the cut bone surfaces prepared with pulsatile lavage. Cement is mixed and once ready for implantation, the size 6 tibial implant, size  6 posterior stabilized femoral component, and the size 35 patella are cemented in place and the patella is held with the clamp. The trial insert is placed and the knee held in full extension. The Exparel (20 ml mixed with 60 ml saline) is injected into the extensor mechanism, posterior capsule, medial and lateral gutters and subcutaneous tissues.  All extruded cement is removed and once the cement is hard the permanent 16 mm posterior stabilized rotating platform insert is placed into the tibial tray.      The wound is copiously irrigated with saline solution and the extensor mechanism closed with # 0 Stratofix suture. The tourniquet is released for a total tourniquet time of 47  minutes. Flexion against gravity is 130 degrees and the patella tracks normally. Subcutaneous tissue is closed with 2.0 vicryl and subcuticular with running 4.0 Monocryl. The incision is cleaned and dried and steri-strips and a bulky sterile dressing are applied. The limb is placed into a knee immobilizer and the patient is awakened and transported to recovery in stable condition.      Please note that a surgical assistant was a medical necessity for this procedure in order to perform it in a safe and expeditious manner. Surgical assistant was necessary to retract the ligaments and vital neurovascular structures to prevent injury to them and also necessary for proper positioning of the limb to allow for anatomic placement of the prosthesis.   Gus Rankin Suzanne Asselin, MD    02/09/2023, 9:40 AM

## 2023-02-09 NOTE — Evaluation (Signed)
Physical Therapy Evaluation Patient Details Name: Suzanne Andrews MRN: [id number] DOB: 1965/09/30 Today's Date: 02/09/2023  History of Present Illness  57 yo female presents to therapy s/p L TKA on 02/09/2023 due to failure of conservative measures. Pt PMH includes but is not limited to: onychomycosis, back pain , venous insufficiency, capillary hemangioma, HTN, and PVD.  Clinical Impression     Suzanne Andrews is a 57 y.o. female POD 0 s/p L TKA. Patient reports IND with mobility at baseline. Patient is now limited by functional impairments (see PT problem list below) and requires CGA and cues for transfers and gait with RW. Patient was able to ambulate 50 and 45 feet with RW and CGA and cues for safe walker management. Patient educated on safe sequencing for functional mobility tasks, fall risk prevention, pain management and goal, use of CP/ice, and car transfers pt and  daughter verbalized safe guarding position for people assisting with mobility. Patient instructed in exercises to facilitate ROM and circulation reviewed and HO provided. Patient will benefit from continued skilled PT interventions to address impairments and progress towards PLOF. Patient has met mobility goals at adequate level for discharge home with family assist and OPPT services scheduled for 9/12; will continue to follow if pt continues acute stay to progress towards Mod I goals.       If plan is discharge home, recommend the following: A little help with walking and/or transfers;A little help with bathing/dressing/bathroom;Assistance with cooking/housework;Assist for transportation;Help with stairs or ramp for entrance   Can travel by private vehicle        Equipment Recommendations Rolling walker (2 wheels) (provided and adjusted at eval)  Recommendations for Other Services       Functional Status Assessment Patient has had a recent decline in their functional status and demonstrates the ability to make  significant improvements in function in a reasonable and predictable amount of time.     Precautions / Restrictions Precautions Precautions: Knee;Fall Restrictions Weight Bearing Restrictions: No      Mobility  Bed Mobility Overal bed mobility: Needs Assistance Bed Mobility: Supine to Sit     Supine to sit: Contact guard     General bed mobility comments: min cues    Transfers Overall transfer level: Needs assistance Equipment used: Rolling walker (2 wheels) Transfers: Sit to/from Stand Sit to Stand: Contact guard assist           General transfer comment: min cues for safety and proper UE placement for bed, recliner and commdode transfers    Ambulation/Gait Ambulation/Gait assistance: Contact guard assist Gait Distance (Feet): 50 Feet Assistive device: Rolling walker (2 wheels) Gait Pattern/deviations: Step-to pattern, Antalgic Gait velocity: decreased     General Gait Details: step almost through pattern with slight lateral sway and min cues for sequencing  Stairs            Wheelchair Mobility     Tilt Bed    Modified Rankin (Stroke Patients Only)       Balance Overall balance assessment: Needs assistance Sitting-balance support: Feet supported Sitting balance-Leahy Scale: Good     Standing balance support: Bilateral upper extremity supported, During functional activity, Reliant on assistive device for balance Standing balance-Leahy Scale: Fair Standing balance comment: static standing no UE support                             Pertinent Vitals/Pain Pain Assessment Pain Assessment: 0-10 Pain Score: 2  Pain Location: L knee Pain Descriptors / Indicators: Operative site guarding (stinging) Pain Intervention(s): Limited activity within patient's tolerance, Monitored during session, Premedicated before session, Repositioned, Ice applied    Home Living Family/patient expects to be discharged to:: Private residence Living  Arrangements: Children Available Help at Discharge: Family Type of Home: House Home Access: Ramped entrance       Home Layout: One level Home Equipment: Patent examiner (4 wheels)      Prior Function Prior Level of Function : Independent/Modified Independent             Mobility Comments: furinture walking and occational use of rollator mod I ADLs, self care tasks, wokring part time       Extremity/Trunk Assessment        Lower Extremity Assessment Lower Extremity Assessment: LLE deficits/detail LLE Deficits / Details: ankle DF/PF 5/5, SLR << 10 degree lag LLE Sensation: decreased light touch (LLE/foot)    Cervical / Trunk Assessment Cervical / Trunk Assessment: Normal  Communication   Communication Communication: No apparent difficulties  Cognition Arousal: Alert Behavior During Therapy: WFL for tasks assessed/performed Overall Cognitive Status: Within Functional Limits for tasks assessed                                          General Comments      Exercises Total Joint Exercises Ankle Circles/Pumps: AROM, Both, 20 reps Quad Sets: AROM, Left, 5 reps Short Arc Quad: AROM, 5 reps, Left Heel Slides: AROM, 5 reps, Left Hip ABduction/ADduction: AROM, 5 reps, Left Straight Leg Raises: AROM, Left, 5 reps Knee Flexion: AROM, Left, 5 reps, Seated   Assessment/Plan    PT Assessment Patient needs continued PT services  PT Problem List Decreased strength;Decreased range of motion;Decreased activity tolerance;Decreased balance;Decreased mobility;Decreased coordination;Pain       PT Treatment Interventions DME instruction;Gait training;Functional mobility training;Therapeutic activities;Therapeutic exercise;Balance training;Neuromuscular re-education;Patient/family education;Modalities    PT Goals (Current goals can be found in the Care Plan section)  Acute Rehab PT Goals Patient Stated Goal: get back to normal lift, get in the gym,  reciprocal pattern up and down steps and have the R TKA done in Nov PT Goal Formulation: With patient Time For Goal Achievement: 02/23/23 Potential to Achieve Goals: Good    Frequency 7X/week     Co-evaluation               AM-PAC PT "6 Clicks" Mobility  Outcome Measure Help needed turning from your back to your side while in a flat bed without using bedrails?: A Little Help needed moving from lying on your back to sitting on the side of a flat bed without using bedrails?: A Little Help needed moving to and from a bed to a chair (including a wheelchair)?: A Little Help needed standing up from a chair using your arms (e.g., wheelchair or bedside chair)?: A Little Help needed to walk in hospital room?: A Little Help needed climbing 3-5 steps with a railing? : A Lot 6 Click Score: 17    End of Session Equipment Utilized During Treatment: Gait belt Activity Tolerance: Patient tolerated treatment well Patient left: in chair;with call bell/phone within reach;with family/visitor present Nurse Communication: Mobility status (pt readiness for d/c from PT standpoint) PT Visit Diagnosis: Unsteadiness on feet (R26.81);Other abnormalities of gait and mobility (R26.89);Muscle weakness (generalized) (M62.81);Difficulty in walking, not elsewhere classified (R26.2);Pain Pain - Right/Left: Left  Pain - part of body: Knee;Leg    Time: 6962-9528 PT Time Calculation (min) (ACUTE ONLY): 43 min   Charges:   PT Evaluation $PT Eval Low Complexity: 1 Low PT Treatments $Gait Training: 8-22 mins $Therapeutic Exercise: 8-22 mins PT General Charges $$ ACUTE PT VISIT: 1 Visit         Johnny Bridge, PT Acute Rehab   Jacqualyn Posey 02/09/2023, 5:22 PM

## 2023-02-09 NOTE — Anesthesia Preprocedure Evaluation (Signed)
Anesthesia Evaluation  Patient identified by MRN, date of birth, ID band Patient awake    Reviewed: Allergy & Precautions, NPO status , Patient's Chart, lab work & pertinent test results, reviewed documented beta blocker date and time   History of Anesthesia Complications Negative for: history of anesthetic complications  Airway Mallampati: III  TM Distance: >3 FB     Dental no notable dental hx.    Pulmonary neg pulmonary ROS, neg COPD   breath sounds clear to auscultation       Cardiovascular hypertension, (-) angina + Peripheral Vascular Disease  (-) CAD, (-) Past MI, (-) Cardiac Stents, (-) CABG, (-) CHF and (-) DVT (-) Cardiac Defibrillator (-) Valvular Problems/Murmurs Rhythm:Regular Rate:Normal     Neuro/Psych neg Seizures negative neurological ROS     GI/Hepatic ,neg GERD  ,,(+) neg Cirrhosis        Endo/Other  neg diabetes    Renal/GU Renal disease     Musculoskeletal  (+) Arthritis ,    Abdominal   Peds  Hematology   Anesthesia Other Findings   Reproductive/Obstetrics                              Anesthesia Physical Anesthesia Plan  ASA: 2  Anesthesia Plan: Spinal   Post-op Pain Management: Regional block*   Induction: Intravenous  PONV Risk Score and Plan: 2 and Propofol infusion and Ondansetron  Airway Management Planned:   Additional Equipment:   Intra-op Plan:   Post-operative Plan:   Informed Consent: I have reviewed the patients History and Physical, chart, labs and discussed the procedure including the risks, benefits and alternatives for the proposed anesthesia with the patient or authorized representative who has indicated his/her understanding and acceptance.     Dental advisory given  Plan Discussed with: CRNA  Anesthesia Plan Comments:          Anesthesia Quick Evaluation

## 2023-02-09 NOTE — Transfer of Care (Signed)
Immediate Anesthesia Transfer of Care Note  Patient: Suzanne Andrews  Procedure(s) Performed: TOTAL KNEE ARTHROPLASTY (Left: Knee)  Patient Location: PACU  Anesthesia Type:Spinal  Level of Consciousness: awake, alert , and oriented  Airway & Oxygen Therapy: Patient Spontanous Breathing and Patient connected to face mask oxygen  Post-op Assessment: Report given to RN and Post -op Vital signs reviewed and stable  Post vital signs: Reviewed and stable  Last Vitals:  Vitals Value Taken Time  BP 114/62 02/09/23 1008  Temp    Pulse 73 02/09/23 1010  Resp 18 02/09/23 1010  SpO2 100 % 02/09/23 1010  Vitals shown include unfiled device data.  Last Pain:  Vitals:   02/09/23 0628  PainSc: 6       Patients Stated Pain Goal: 4 (02/09/23 1610)  Complications: No notable events documented.

## 2023-02-09 NOTE — Care Plan (Signed)
Ortho Bundle Case Management Note  Patient Details  Name: MADDYX BEAMES MRN: [id number] Date of Birth: 01/06/1966  L TKA on 02-09-23 DCP:  Home with dtrs DME:  RW ordered through [facility] PT:  Monico Hoar on 02-12-23                   DME Arranged:  Dan Humphreys rolling DME Agency:  [employer]  HH Arranged:  NA HH Agency:  NA  Additional Comments: Please contact me with any questions of if this plan should need to change.  Ennis Forts, RN,CCM [facility]  [phone] 02/09/2023, 9:53 AM

## 2023-02-09 NOTE — Anesthesia Procedure Notes (Signed)
Spinal  Patient location during procedure: OR Start time: 02/09/2023 8:20 AM End time: 02/09/2023 8:24 AM Reason for block: surgical anesthesia Staffing Performed: anesthesiologist  Anesthesiologist: Mariann Barter, MD Performed by: Mariann Barter, MD Authorized by: Mariann Barter, MD   Preanesthetic Checklist Completed: patient identified, IV checked, site marked, risks and benefits discussed, surgical consent, monitors and equipment checked, pre-op evaluation and timeout performed Spinal Block Patient position: sitting Prep: DuraPrep Patient monitoring: heart rate, cardiac monitor, continuous pulse ox and blood pressure Approach: midline Location: L3-4 Injection technique: single-shot Needle Needle type: Sprotte  Needle gauge: 24 G Needle length: 9 cm Assessment Sensory level: T4 Events: CSF return

## 2023-02-10 ENCOUNTER — Encounter (HOSPITAL_COMMUNITY): Payer: Self-pay | Admitting: Orthopedic Surgery

## 2023-02-12 DIAGNOSIS — M25561 Pain in right knee: Secondary | ICD-10-CM | POA: Diagnosis not present

## 2023-02-12 DIAGNOSIS — M25661 Stiffness of right knee, not elsewhere classified: Secondary | ICD-10-CM | POA: Diagnosis not present

## 2023-02-16 DIAGNOSIS — M25561 Pain in right knee: Secondary | ICD-10-CM | POA: Diagnosis not present

## 2023-02-16 DIAGNOSIS — M25661 Stiffness of right knee, not elsewhere classified: Secondary | ICD-10-CM | POA: Diagnosis not present

## 2023-02-18 DIAGNOSIS — M25562 Pain in left knee: Secondary | ICD-10-CM | POA: Diagnosis not present

## 2023-02-18 DIAGNOSIS — M25662 Stiffness of left knee, not elsewhere classified: Secondary | ICD-10-CM | POA: Diagnosis not present

## 2023-02-20 DIAGNOSIS — M25662 Stiffness of left knee, not elsewhere classified: Secondary | ICD-10-CM | POA: Diagnosis not present

## 2023-02-20 DIAGNOSIS — M25562 Pain in left knee: Secondary | ICD-10-CM | POA: Diagnosis not present

## 2023-02-23 DIAGNOSIS — M25662 Stiffness of left knee, not elsewhere classified: Secondary | ICD-10-CM | POA: Diagnosis not present

## 2023-02-23 DIAGNOSIS — M25562 Pain in left knee: Secondary | ICD-10-CM | POA: Diagnosis not present

## 2023-02-24 ENCOUNTER — Telehealth: Payer: Self-pay | Admitting: Family Medicine

## 2023-02-24 NOTE — Telephone Encounter (Signed)
Patient is calling wishing to speak with a nurse regarding her BP medication. Please advise Thank you

## 2023-03-02 NOTE — Telephone Encounter (Signed)
LMTRC-KG 

## 2023-03-05 DIAGNOSIS — M25662 Stiffness of left knee, not elsewhere classified: Secondary | ICD-10-CM | POA: Diagnosis not present

## 2023-03-05 DIAGNOSIS — M25562 Pain in left knee: Secondary | ICD-10-CM | POA: Diagnosis not present

## 2023-03-06 DIAGNOSIS — M25562 Pain in left knee: Secondary | ICD-10-CM | POA: Diagnosis not present

## 2023-03-06 DIAGNOSIS — M25662 Stiffness of left knee, not elsewhere classified: Secondary | ICD-10-CM | POA: Diagnosis not present

## 2023-03-09 DIAGNOSIS — M25662 Stiffness of left knee, not elsewhere classified: Secondary | ICD-10-CM | POA: Diagnosis not present

## 2023-03-09 DIAGNOSIS — M25562 Pain in left knee: Secondary | ICD-10-CM | POA: Diagnosis not present

## 2023-03-13 DIAGNOSIS — M25662 Stiffness of left knee, not elsewhere classified: Secondary | ICD-10-CM | POA: Diagnosis not present

## 2023-03-13 DIAGNOSIS — M25562 Pain in left knee: Secondary | ICD-10-CM | POA: Diagnosis not present

## 2023-03-16 DIAGNOSIS — M25562 Pain in left knee: Secondary | ICD-10-CM | POA: Diagnosis not present

## 2023-03-16 DIAGNOSIS — M25662 Stiffness of left knee, not elsewhere classified: Secondary | ICD-10-CM | POA: Diagnosis not present

## 2023-03-17 DIAGNOSIS — Z5189 Encounter for other specified aftercare: Secondary | ICD-10-CM | POA: Diagnosis not present

## 2023-03-31 ENCOUNTER — Ambulatory Visit: Payer: Medicare HMO | Admitting: Family Medicine

## 2023-05-06 NOTE — H&P (Signed)
TOTAL KNEE ADMISSION H&P  Patient is being admitted for right total knee arthroplasty.  Subjective:  Chief Complaint: Right knee pain.  HPI: Suzanne Andrews, 57 y.o. female has a history of pain and functional disability in the right knee due to arthritis and has failed non-surgical conservative treatments for greater than 12 weeks to include corticosteriod injections, viscosupplementation injections, and activity modification. Onset of symptoms was gradual, starting 5 years ago with gradually worsening course since that time. The patient noted no past surgery on the right knee.  Patient currently rates pain in the right knee at 8 out of 10 with activity. Patient has worsening of pain with activity and weight bearing and pain that interferes with activities of daily living. Patient has evidence of periarticular osteophytes and joint space narrowing by imaging studies. There is no active infection.  Patient Active Problem List   Diagnosis Date Noted   OA (osteoarthritis) of knee 02/09/2023   Annual visit for general adult medical examination with abnormal findings 11/27/2022   Pre-op examination 11/27/2022   Onychomycosis 11/27/2022   Nausea 10/09/2022   Screening due 06/14/2020   Back pain 08/06/2019   Venous insufficiency of both lower extremities 12/17/2016   Right knee pain 08/02/2015   Pain in joint, ankle and foot 05/15/2015   Ovarian cyst, left 04/04/2011   Prediabetes 04/04/2011   Benign neoplasm of skin 08/02/2009   Hemangioma 08/02/2009   Osteoarthritis, knee 08/02/2009   Allergic rhinitis 11/21/2008   Morbid obesity (HCC) 02/01/2008   Essential hypertension 02/01/2008    Past Medical History:  Diagnosis Date   Arthritis    Hypertension    Pre-diabetes    PVD (peripheral vascular disease) (HCC)     Past Surgical History:  Procedure Laterality Date   CESAREAN SECTION     x 2   CHOLECYSTECTOMY     KNEE ARTHROSCOPY WITH MEDIAL MENISECTOMY Left 07/18/2014    Procedure: LEFT KNEE ARTHROSCOPY WITH MEDIAL AND LATERAL MENISECTOMY;  Surgeon: Darreld Mclean, MD;  Location: [facility];  Service: Orthopedics;  Laterality: Left;   TOTAL KNEE ARTHROPLASTY Left 02/09/2023   Procedure: TOTAL KNEE ARTHROPLASTY;  Surgeon: Ollen Gross, MD;  Location: [facility];  Service: Orthopedics;  Laterality: Left;   TUBAL LIGATION     vascular surgery bilateral leg      Prior to Admission medications   Medication Sig Start Date End Date Taking? Authorizing Provider  acetaminophen (TYLENOL) 500 MG tablet Take 1,000 mg by mouth every 6 (six) hours as needed for moderate pain.    [provider]  aspirin EC 81 MG tablet Take 81 mg aspirin twice daily for three weeks after surgery. Then take one 81 mg aspirin once a day for three weeks. Then discontinue aspirin. Swallow whole. 02/10/23   Cory Munch, PA-C  gabapentin (NEURONTIN) 300 MG capsule Take a 300 mg capsule three times a day for two weeks following surgery.Then take a 300 mg capsule two times a day for two weeks. Then take a 300 mg capsule once a day for two weeks. Then discontinue. 02/10/23   Cory Munch, PA-C  lisinopril-hydrochlorothiazide (ZESTORETIC) 20-25 MG tablet Take 1 tablet by mouth daily. 06/17/22   Kerri Perches, MD  loratadine (CLARITIN) 10 MG tablet Take 10 mg by mouth daily as needed for allergies.    [provider]  methocarbamol (ROBAXIN) 500 MG tablet Take 1 tablet (500 mg total) by mouth every 6 (six) hours as needed for muscle spasms. 02/09/23  Cory Munch, PA-C  Multiple Vitamin (MULTIVITAMIN WITH MINERALS) TABS tablet Take 1 tablet by mouth daily.    [provider]  oxyCODONE (ROXICODONE) 5 MG immediate release tablet Take 1-2 tablets (5-10 mg total) by mouth every 4 (four) hours as needed for severe pain. 02/09/23   Cory Munch, PA-C  phentermine (ADIPEX-P) 37.5 MG tablet Take 1 tablet (37.5 mg total) by mouth daily before  breakfast. Patient taking differently: Take 18.75 mg by mouth daily before breakfast. 01/11/23   Kerri Perches, MD  promethazine (PHENERGAN) 12.5 MG tablet Take 1 tablet (12.5 mg total) by mouth every 6 (six) hours as needed for nausea or vomiting. 02/09/23   Cory Munch, PA-C  terbinafine (LAMISIL) 250 MG tablet Take 1 tablet (250 mg total) by mouth daily. Patient not taking: Reported on 01/16/2023 11/27/22   Kerri Perches, MD    Allergies  Allergen Reactions   Septra [Sulfamethoxazole-Trimethoprim] Rash   Bentyl [Dicyclomine] Other (See Comments)    Reports caused floaters and constipation   Zofran [Ondansetron] Other (See Comments)    Reports caused floaters and constipation    Social History   Socioeconomic History   Marital status: Divorced    Spouse name: Not on file   Number of children: Not on file   Years of education: Not on file   Highest education level: Not on file  Occupational History   Not on file  Tobacco Use   Smoking status: Never   Smokeless tobacco: Never  Vaping Use   Vaping status: Never Used  Substance and Sexual Activity   Alcohol use: No   Drug use: No   Sexual activity: Yes    Birth control/protection: Surgical  Other Topics Concern   Not on file  Social History Narrative   Not on file   Social Determinants of Health   Financial Resource Strain: Low Risk  (06/11/2022)   Overall Financial Resource Strain (CARDIA)    Difficulty of Paying Living Expenses: Not hard at all  Food Insecurity: No Food Insecurity (06/11/2022)   Hunger Vital Sign    Worried About Running Out of Food in the Last Year: Never true    Ran Out of Food in the Last Year: Never true  Transportation Needs: No Transportation Needs (06/11/2022)   PRAPARE - Administrator, Civil Service (Medical): No    Lack of Transportation (Non-Medical): No  Physical Activity: Sufficiently Active (06/11/2022)   Exercise Vital Sign    Days of Exercise per Week:  3 days    Minutes of Exercise per Session: 50 min  Stress: No Stress Concern Present (06/11/2022)   [employer] of Occupational Health - Occupational Stress Questionnaire    Feeling of Stress : Not at all  Social Connections: Moderately Integrated (06/11/2022)   Social Connection and Isolation Panel [NHANES]    Frequency of Communication with Friends and Family: More than three times a week    Frequency of Social Gatherings with Friends and Family: Three times a week    Attends Religious Services: 1 to 4 times per year    Active Member of Clubs or Organizations: Yes    Attends Banker Meetings: 1 to 4 times per year    Marital Status: Divorced  Intimate Partner Violence: Not At Risk (06/11/2022)   Humiliation, Afraid, Rape, and Kick questionnaire    Fear of Current or Ex-Partner: No    Emotionally Abused: No    Physically Abused: No  Sexually Abused: No    Tobacco Use: Low Risk  (02/09/2023)   Patient History    Smoking Tobacco Use: Never    Smokeless Tobacco Use: Never    Passive Exposure: Not on file   Social History   Substance and Sexual Activity  Alcohol Use No    Family History  Problem Relation Age of Onset   Hypertension Mother    Cancer Father    Breast cancer Sister    Hypertension Sister    Cancer Sister        breast   Cancer Brother    Hypertension Brother    Cancer Brother     ROS  Objective:  Physical Exam: The patient is a pleasant, well-developed female alert and oriented in no apparent distress.    Right Knee Exam:  No effusion present. No swelling present.  The range of motion is: 5 to 125 degrees.  Moderate crepitus on range of motion of the knee.  Positive medial greater than lateral joint line tenderness.  The knee is stable. The patient's sensation and motor function are intact in their lower extremities. Their distal pulses are 2+. The bilateral calves are soft and non-tender.  Radiographs: Ap and lateral bilateral  knees from today show advanced OA of the right knee. Left TKA in good alignment with no periprosthetic abnormalities.  Assessment/Plan:  End stage arthritis, right knee   The patient history, physical examination, clinical judgment of the provider and imaging studies are consistent with end stage degenerative joint disease of the right knee and total knee arthroplasty is deemed medically necessary. The treatment options including medical management, injection therapy arthroscopy and arthroplasty were discussed at length. The risks and benefits of total knee arthroplasty were presented and reviewed. The risks due to aseptic loosening, infection, stiffness, patella tracking problems, thromboembolic complications and other imponderables were discussed. The patient acknowledged the explanation, agreed to proceed with the plan and consent was signed. Patient is being admitted for inpatient treatment for surgery, pain control, PT, OT, prophylactic antibiotics, VTE prophylaxis, progressive ambulation and ADLs and discharge planning. The patient is planning to be discharged home.   Patient's anticipated LOS is less than 2 midnights, meeting these requirements: - Younger than 67 - Lives within 1 hour of care - Has a competent adult at home to recover with post-op recover - NO history of  - Chronic pain requiring opiods  - Diabetes  - Coronary Artery Disease  - Heart failure  - Heart attack  - Stroke  - DVT/VTE  - Cardiac arrhythmia  - Respiratory Failure/COPD  - Renal failure  - Anemia  - Advanced Liver disease  Therapy Plans: Outpatient therapy at [facility]) Disposition: Home with daughters Planned DVT Prophylaxis: Aspirin 81mg  BID  DME Needed: None PCP: Syliva Overman, MD (clearance received) TXA: IV Allergies: Sulfa, adhesive, tramadol Anesthesia Concerns: None BMI: 39.4 Last HgbA1c: Not diabetic Pharmacy: [facility] in [place]  Other: -Oxycodone, no tramadol - Wants SDD -  Kelly notified -Reaction to aquacel after R TKA  - Patient was instructed on what medications to stop prior to surgery. - Follow-up visit in 2 weeks with Dr. Lequita Halt - Begin physical therapy following surgery - Pre-operative lab work as pre-surgical testing - Prescriptions will be provided in hospital at time of discharge  Weston Brass, PA-C Orthopedic Surgery [facility] [place]

## 2023-05-18 DIAGNOSIS — Z6839 Body mass index (BMI) 39.0-39.9, adult: Secondary | ICD-10-CM | POA: Diagnosis not present

## 2023-05-18 DIAGNOSIS — Z809 Family history of malignant neoplasm, unspecified: Secondary | ICD-10-CM | POA: Diagnosis not present

## 2023-05-18 DIAGNOSIS — N182 Chronic kidney disease, stage 2 (mild): Secondary | ICD-10-CM | POA: Diagnosis not present

## 2023-05-18 DIAGNOSIS — I129 Hypertensive chronic kidney disease with stage 1 through stage 4 chronic kidney disease, or unspecified chronic kidney disease: Secondary | ICD-10-CM | POA: Diagnosis not present

## 2023-05-18 DIAGNOSIS — M1990 Unspecified osteoarthritis, unspecified site: Secondary | ICD-10-CM | POA: Diagnosis not present

## 2023-05-18 DIAGNOSIS — Z882 Allergy status to sulfonamides status: Secondary | ICD-10-CM | POA: Diagnosis not present

## 2023-05-18 NOTE — Patient Instructions (Signed)
SURGICAL WAITING ROOM VISITATION  Patients having surgery or a procedure may have no more than 2 support people in the waiting area - these visitors may rotate.    Children under the age of 45 must have an adult with them who is not the patient.  Due to an increase in RSV and influenza rates and associated hospitalizations, children ages 53 and under may not visit patients in [facility] hospitals.  If the patient needs to stay at the hospital during part of their recovery, the visitor guidelines for inpatient rooms apply. Pre-op nurse will coordinate an appropriate time for 1 support person to accompany patient in pre-op.  This support person may not rotate.    Please refer to the [facility] website for the visitor guidelines for Inpatients (after your surgery is over and you are in a regular room).       Your procedure is scheduled on:  05/25/23    Report to [facility] Main Entrance    Report to admitting at  0600 AM   Call this number if you have problems the morning of surgery [phone]   Do not eat food :After Midnight.   After Midnight you may have the following liquids until _ 0515_____ AM  DAY OF SURGERY  Water Non-Citrus Juices (without pulp, NO RED-Apple, White grape, White cranberry) Black Coffee (NO MILK/CREAM OR CREAMERS, sugar ok)  Clear Tea (NO MILK/CREAM OR CREAMERS, sugar ok) regular and decaf                             Plain Jell-O (NO RED)                                           Fruit ices (not with fruit pulp, NO RED)                                     Popsicles (NO RED)                                                               Sports drinks like Gatorade (NO RED)                   The day of surgery:  Drink ONE (1) Pre-Surgery Clear Ensure or G2 at   0515AM ( have completed by )  the morning of surgery. Drink in one sitting. Do not sip.  This drink was given to you during your hospital  pre-op appointment visit. Nothing else to  drink after completing the  Pre-Surgery Clear Ensure or G2.          If you have questions, please contact your surgeon's office.       Oral Hygiene is also important to reduce your risk of infection.                                    Remember - BRUSH YOUR TEETH THE MORNING OF SURGERY WITH  YOUR REGULAR TOOTHPASTE  DENTURES WILL BE REMOVED PRIOR TO SURGERY PLEASE DO NOT APPLY "Poly grip" OR ADHESIVES!!!   Do NOT smoke after Midnight   Stop all vitamins and herbal supplements 7 days before surgery.   Take these medicines the morning of surgery with A SIP OF WATER:  claritin if needed   DO NOT TAKE ANY ORAL DIABETIC MEDICATIONS DAY OF YOUR SURGERY  Bring CPAP mask and tubing day of surgery.                              You may not have any metal on your body including hair pins, jewelry, and body piercing             Do not wear make-up, lotions, powders, perfumes/cologne, or deodorant  Do not wear nail polish including gel and S&S, artificial/acrylic nails, or any other type of covering on natural nails including finger and toenails. If you have artificial nails, gel coating, etc. that needs to be removed by a nail salon please have this removed prior to surgery or surgery may need to be canceled/ delayed if the surgeon/ anesthesia feels like they are unable to be safely monitored.   Do not shave  48 hours prior to surgery.               Men may shave face and neck.   Do not bring valuables to the hospital. [facility] IS NOT             RESPONSIBLE   FOR VALUABLES.   Contacts, glasses, dentures or bridgework may not be worn into surgery.   Bring small overnight bag day of surgery.   DO NOT BRING YOUR HOME MEDICATIONS TO THE HOSPITAL. PHARMACY WILL DISPENSE MEDICATIONS LISTED ON YOUR MEDICATION LIST TO YOU DURING YOUR ADMISSION IN THE HOSPITAL!    Patients discharged on the day of surgery will not be allowed to drive home.  Someone NEEDS to stay with you for the first 24  hours after anesthesia.   Special Instructions: Bring a copy of your healthcare power of attorney and living will documents the day of surgery if you haven't scanned them before.              Please read over the following fact sheets you were given: IF YOU HAVE QUESTIONS ABOUT YOUR PRE-OP INSTRUCTIONS PLEASE CALL [phone]   If you received a COVID test during your pre-op visit  it is requested that you wear a mask when out in public, stay away from anyone that may not be feeling well and notify your surgeon if you develop symptoms. If you test positive for Covid or have been in contact with anyone that has tested positive in the last 10 days please notify you surgeon.      Pre-operative 5 CHG Bath Instructions   You can play a key role in reducing the risk of infection after surgery. Your skin needs to be as free of germs as possible. You can reduce the number of germs on your skin by washing with CHG (chlorhexidine gluconate) soap before surgery. CHG is an antiseptic soap that kills germs and continues to kill germs even after washing.   DO NOT use if you have an allergy to chlorhexidine/CHG or antibacterial soaps. If your skin becomes reddened or irritated, stop using the CHG and notify one of our RNs at [phone].   Please shower with the CHG  soap starting 4 days before surgery using the following schedule:     Please keep in mind the following:  DO NOT shave, including legs and underarms, starting the day of your first shower.   You may shave your face at any point before/day of surgery.  Place clean sheets on your bed the day you start using CHG soap. Use a clean washcloth (not used since being washed) for each shower. DO NOT sleep with pets once you start using the CHG.   CHG Shower Instructions:  If you choose to wash your hair and private area, wash first with your normal shampoo/soap.  After you use shampoo/soap, rinse your hair and body thoroughly to remove  shampoo/soap residue.  Turn the water OFF and apply about 3 tablespoons (45 ml) of CHG soap to a CLEAN washcloth.  Apply CHG soap ONLY FROM YOUR NECK DOWN TO YOUR TOES (washing for 3-5 minutes)  DO NOT use CHG soap on face, private areas, open wounds, or sores.  Pay special attention to the area where your surgery is being performed.  If you are having back surgery, having someone wash your back for you may be helpful. Wait 2 minutes after CHG soap is applied, then you may rinse off the CHG soap.  Pat dry with a clean towel  Put on clean clothes/pajamas   If you choose to wear lotion, please use ONLY the CHG-compatible lotions on the back of this paper.     Additional instructions for the day of surgery: DO NOT APPLY any lotions, deodorants, cologne, or perfumes.   Put on clean/comfortable clothes.  Brush your teeth.  Ask your nurse before applying any prescription medications to the skin.      CHG Compatible Lotions   Aveeno Moisturizing lotion  Cetaphil Moisturizing Cream  Cetaphil Moisturizing Lotion  Clairol Herbal Essence Moisturizing Lotion, Dry Skin  Clairol Herbal Essence Moisturizing Lotion, Extra Dry Skin  Clairol Herbal Essence Moisturizing Lotion, Normal Skin  Curel Age Defying Therapeutic Moisturizing Lotion with Alpha Hydroxy  Curel Extreme Care Body Lotion  Curel Soothing Hands Moisturizing Hand Lotion  Curel Therapeutic Moisturizing Cream, Fragrance-Free  Curel Therapeutic Moisturizing Lotion, Fragrance-Free  Curel Therapeutic Moisturizing Lotion, Original Formula  Eucerin Daily Replenishing Lotion  Eucerin Dry Skin Therapy Plus Alpha Hydroxy Crme  Eucerin Dry Skin Therapy Plus Alpha Hydroxy Lotion  Eucerin Original Crme  Eucerin Original Lotion  Eucerin Plus Crme Eucerin Plus Lotion  Eucerin TriLipid Replenishing Lotion  Keri Anti-Bacterial Hand Lotion  Keri Deep Conditioning Original Lotion Dry Skin Formula Softly Scented  Keri Deep Conditioning  Original Lotion, Fragrance Free Sensitive Skin Formula  Keri Lotion Fast Absorbing Fragrance Free Sensitive Skin Formula  Keri Lotion Fast Absorbing Softly Scented Dry Skin Formula  Keri Original Lotion  Keri Skin Renewal Lotion Keri Silky Smooth Lotion  Keri Silky Smooth Sensitive Skin Lotion  Nivea Body Creamy Conditioning Oil  Nivea Body Extra Enriched Teacher, adult education Moisturizing Lotion Nivea Crme  Nivea Skin Firming Lotion  NutraDerm 30 Skin Lotion  NutraDerm Skin Lotion  NutraDerm Therapeutic Skin Cream  NutraDerm Therapeutic Skin Lotion  ProShield Protective Hand Cream  Provon moisturizing lotion

## 2023-05-18 NOTE — Progress Notes (Signed)
Anesthesia Review:  PCP: Suzanne Andrews LVO 11/27/22  Cardiologist : Chest x-ray : 11/27/22- 2 view  EKG : 11/27/22  Echo : Stress test: Cardiac Cath :  Activity level:  Sleep Study/ CPAP : Fasting Blood Sugar :      / Checks Blood Sugar -- times a day:   Blood Thinner/ Instructions /Last Dose: ASA / Instructions/ Last Dose :    Prediabetes

## 2023-05-20 ENCOUNTER — Encounter (HOSPITAL_COMMUNITY): Payer: Self-pay

## 2023-05-20 ENCOUNTER — Encounter (HOSPITAL_COMMUNITY)
Admission: RE | Admit: 2023-05-20 | Discharge: 2023-05-20 | Disposition: A | Discharge status: Home / Self Care | Payer: Medicare HMO | Source: Ambulatory Visit | Attending: Orthopedic Surgery | Admitting: Orthopedic Surgery

## 2023-05-20 ENCOUNTER — Other Ambulatory Visit: Payer: Self-pay

## 2023-05-20 VITALS — BP 165/92 | HR 71 | Temp 98.4°F | Resp 16 | Ht 67.0 in | Wt 240.0 lb

## 2023-05-20 DIAGNOSIS — Z01818 Encounter for other preprocedural examination: Secondary | ICD-10-CM

## 2023-05-20 DIAGNOSIS — Z01812 Encounter for preprocedural laboratory examination: Secondary | ICD-10-CM | POA: Diagnosis present

## 2023-05-20 LAB — BASIC METABOLIC PANEL
Anion gap: 8 (ref 5–15)
BUN: 13 mg/dL (ref 6–20)
CO2: 27 mmol/L (ref 22–32)
Calcium: 9.7 mg/dL (ref 8.9–10.3)
Chloride: 103 mmol/L (ref 98–111)
Creatinine, Ser: 0.79 mg/dL (ref 0.44–1.00)
GFR, Estimated: >60 mL/min (ref >60)
Glucose, Bld: 91 mg/dL (ref 70–99)
Potassium: 3.7 mmol/L (ref 3.5–5.1)
Sodium: 138 mmol/L (ref 135–145)

## 2023-05-20 LAB — CBC
HCT: 39.5 % (ref 36.0–46.0)
Hemoglobin: 12.4 g/dL (ref 12.0–15.0)
MCH: 28.6 pg (ref 26.0–34.0)
MCHC: 31.4 g/dL (ref 30.0–36.0)
MCV: 91 fL (ref 80.0–100.0)
Platelets: 348 10*3/uL (ref 150–400)
RBC: 4.34 MIL/uL (ref 3.87–5.11)
RDW: 13.9 % (ref 11.5–15.5)
WBC: 4.8 10*3/uL (ref 4.0–10.5)
nRBC: 0 % (ref 0.0–0.2)

## 2023-05-20 LAB — SURGICAL PCR SCREEN
MRSA, PCR: NEGATIVE
Staphylococcus aureus: NEGATIVE

## 2023-05-25 ENCOUNTER — Other Ambulatory Visit: Payer: Self-pay

## 2023-05-25 ENCOUNTER — Encounter (HOSPITAL_COMMUNITY): Payer: Self-pay | Admitting: Orthopedic Surgery

## 2023-05-25 ENCOUNTER — Encounter (HOSPITAL_COMMUNITY)
Admission: RE | Disposition: A | Discharge status: Home / Self Care | Payer: Self-pay | Source: Home / Self Care | Attending: Orthopedic Surgery

## 2023-05-25 ENCOUNTER — Ambulatory Visit (HOSPITAL_COMMUNITY): Payer: Medicare HMO | Admitting: Medical

## 2023-05-25 ENCOUNTER — Ambulatory Visit (HOSPITAL_COMMUNITY): Payer: Medicare HMO | Admitting: Anesthesiology

## 2023-05-25 ENCOUNTER — Ambulatory Visit (HOSPITAL_COMMUNITY)
Admission: RE | Admit: 2023-05-25 | Discharge: 2023-05-25 | Disposition: A | Discharge status: Home / Self Care | Payer: Medicare HMO | Attending: Orthopedic Surgery | Admitting: Orthopedic Surgery

## 2023-05-25 DIAGNOSIS — M1711 Unilateral primary osteoarthritis, right knee: Secondary | ICD-10-CM | POA: Diagnosis not present

## 2023-05-25 DIAGNOSIS — G8918 Other acute postprocedural pain: Secondary | ICD-10-CM | POA: Diagnosis not present

## 2023-05-25 DIAGNOSIS — Z01818 Encounter for other preprocedural examination: Secondary | ICD-10-CM

## 2023-05-25 DIAGNOSIS — E1151 Type 2 diabetes mellitus with diabetic peripheral angiopathy without gangrene: Secondary | ICD-10-CM | POA: Insufficient documentation

## 2023-05-25 DIAGNOSIS — I10 Essential (primary) hypertension: Secondary | ICD-10-CM | POA: Diagnosis not present

## 2023-05-25 DIAGNOSIS — M179 Osteoarthritis of knee, unspecified: Secondary | ICD-10-CM | POA: Diagnosis present

## 2023-05-25 HISTORY — PX: TOTAL KNEE ARTHROPLASTY: SHX125

## 2023-05-25 SURGERY — ARTHROPLASTY, KNEE, TOTAL
Anesthesia: Regional | Site: Knee | Laterality: Right

## 2023-05-25 MED ORDER — ORAL CARE MOUTH RINSE: 15.0000 mL | Freq: Once | OROMUCOSAL | Status: AC

## 2023-05-25 MED ORDER — GABAPENTIN 300 MG PO CAPS: ORAL_CAPSULE | ORAL | 0 refills | Status: DC

## 2023-05-25 MED ORDER — METHOCARBAMOL 500 MG PO TABS
500.0000 mg | ORAL_TABLET | Freq: Four times a day (QID) | ORAL | 0 refills | Status: DC | PRN
Start: 2023-05-25 — End: 2023-07-15

## 2023-05-25 MED ORDER — CHLORHEXIDINE GLUCONATE 0.12 % MT SOLN
15.0000 mL | Freq: Once | OROMUCOSAL | Status: AC
  Administered 2023-05-25: 15 mL via OROMUCOSAL

## 2023-05-25 MED ORDER — BUPIVACAINE LIPOSOME 1.3 % IJ SUSP
INTRAMUSCULAR | Status: AC
  Filled 2023-05-25: qty 20

## 2023-05-25 MED ORDER — TRANEXAMIC ACID-NACL 1000-0.7 MG/100ML-% IV SOLN
1000.0000 mg | INTRAVENOUS | Status: AC
  Administered 2023-05-25: 1000 mg via INTRAVENOUS
  Filled 2023-05-25: qty 100

## 2023-05-25 MED ORDER — PROPOFOL 1000 MG/100ML IV EMUL
INTRAVENOUS | Status: AC
Start: 2023-05-25 — End: ?
  Filled 2023-05-25: qty 100

## 2023-05-25 MED ORDER — PROPOFOL 500 MG/50ML IV EMUL
INTRAVENOUS | Status: DC | PRN
  Administered 2023-05-25: 30 mg via INTRAVENOUS
  Administered 2023-05-25: 100 ug/kg/min via INTRAVENOUS

## 2023-05-25 MED ORDER — METHOCARBAMOL 500 MG PO TABS: 500.0000 mg | ORAL_TABLET | Freq: Four times a day (QID) | ORAL | Status: DC | PRN

## 2023-05-25 MED ORDER — MIDAZOLAM HCL 5 MG/5ML IJ SOLN
INTRAMUSCULAR | Status: DC | PRN
  Administered 2023-05-25 (×2): 1 mg via INTRAVENOUS

## 2023-05-25 MED ORDER — FENTANYL CITRATE PF 50 MCG/ML IJ SOSY
100.0000 ug | PREFILLED_SYRINGE | INTRAMUSCULAR | Status: DC
  Filled 2023-05-25: qty 2

## 2023-05-25 MED ORDER — CEFAZOLIN SODIUM-DEXTROSE 2-4 GM/100ML-% IV SOLN: 2.0000 g | Freq: Four times a day (QID) | INTRAVENOUS | Status: DC

## 2023-05-25 MED ORDER — BUPIVACAINE LIPOSOME 1.3 % IJ SUSP
INTRAMUSCULAR | Status: DC | PRN
  Administered 2023-05-25: 20 mL

## 2023-05-25 MED ORDER — BUPIVACAINE LIPOSOME 1.3 % IJ SUSP: 20.0000 mL | Freq: Once | INTRAMUSCULAR | Status: DC

## 2023-05-25 MED ORDER — FENTANYL CITRATE (PF) 100 MCG/2ML IJ SOLN
INTRAMUSCULAR | Status: AC
  Filled 2023-05-25: qty 2

## 2023-05-25 MED ORDER — PROPOFOL 500 MG/50ML IV EMUL
INTRAVENOUS | Status: AC
  Filled 2023-05-25: qty 50

## 2023-05-25 MED ORDER — PHENYLEPHRINE HCL (PRESSORS) 10 MG/ML IV SOLN
INTRAVENOUS | Status: DC | PRN
  Administered 2023-05-25: 160 ug via INTRAVENOUS
  Administered 2023-05-25: 80 ug via INTRAVENOUS
  Administered 2023-05-25 (×2): 160 ug via INTRAVENOUS
  Administered 2023-05-25: 80 ug via INTRAVENOUS
  Administered 2023-05-25: 160 ug via INTRAVENOUS
  Administered 2023-05-25 (×3): 80 ug via INTRAVENOUS
  Administered 2023-05-25 (×2): 160 ug via INTRAVENOUS

## 2023-05-25 MED ORDER — PHENYLEPHRINE 80 MCG/ML (10ML) SYRINGE FOR IV PUSH (FOR BLOOD PRESSURE SUPPORT)
PREFILLED_SYRINGE | INTRAVENOUS | Status: AC
  Filled 2023-05-25: qty 10

## 2023-05-25 MED ORDER — 0.9 % SODIUM CHLORIDE (POUR BTL) OPTIME
TOPICAL | Status: DC | PRN
  Administered 2023-05-25: 1000 mL

## 2023-05-25 MED ORDER — OXYCODONE HCL 5 MG PO TABS: 5.0000 mg | ORAL_TABLET | Freq: Three times a day (TID) | ORAL | 0 refills | Status: DC | PRN

## 2023-05-25 MED ORDER — POVIDONE-IODINE 10 % EX SWAB: 2.0000 | Freq: Once | CUTANEOUS | Status: DC

## 2023-05-25 MED ORDER — STERILE WATER FOR IRRIGATION IR SOLN
Status: DC | PRN
  Administered 2023-05-25: 1000 mL

## 2023-05-25 MED ORDER — MIDAZOLAM HCL 2 MG/2ML IJ SOLN
2.0000 mg | INTRAMUSCULAR | Status: DC
  Administered 2023-05-25: 1 mg via INTRAVENOUS
  Filled 2023-05-25: qty 2

## 2023-05-25 MED ORDER — SODIUM CHLORIDE 0.9 % IR SOLN
Status: DC | PRN
  Administered 2023-05-25: 1000 mL

## 2023-05-25 MED ORDER — AMISULPRIDE (ANTIEMETIC) 5 MG/2ML IV SOLN: 10.0000 mg | Freq: Once | INTRAVENOUS | Status: DC | PRN

## 2023-05-25 MED ORDER — LACTATED RINGERS IV SOLN: INTRAVENOUS | Status: DC

## 2023-05-25 MED ORDER — ACETAMINOPHEN 10 MG/ML IV SOLN
1000.0000 mg | Freq: Four times a day (QID) | INTRAVENOUS | Status: DC
  Administered 2023-05-25: 1000 mg via INTRAVENOUS
  Filled 2023-05-25 (×2): qty 100

## 2023-05-25 MED ORDER — DEXAMETHASONE SODIUM PHOSPHATE 10 MG/ML IJ SOLN
INTRAMUSCULAR | Status: AC
  Filled 2023-05-25: qty 1

## 2023-05-25 MED ORDER — SODIUM CHLORIDE (PF) 0.9 % IJ SOLN
INTRAMUSCULAR | Status: DC | PRN
  Administered 2023-05-25: 60 mL

## 2023-05-25 MED ORDER — FENTANYL CITRATE PF 50 MCG/ML IJ SOSY: 25.0000 ug | PREFILLED_SYRINGE | INTRAMUSCULAR | Status: DC | PRN

## 2023-05-25 MED ORDER — BUPIVACAINE-EPINEPHRINE (PF) 0.5% -1:200000 IJ SOLN
INTRAMUSCULAR | Status: DC | PRN
Start: 2023-05-25 — End: 2023-05-25
  Administered 2023-05-25: 30 mL via PERINEURAL

## 2023-05-25 MED ORDER — METHOCARBAMOL 1000 MG/10ML IJ SOLN
500.0000 mg | Freq: Four times a day (QID) | INTRAMUSCULAR | Status: DC | PRN
Start: 2023-05-25 — End: 2023-05-25

## 2023-05-25 MED ORDER — MIDAZOLAM HCL 2 MG/2ML IJ SOLN
INTRAMUSCULAR | Status: AC
Start: 2023-05-25 — End: ?
  Filled 2023-05-25: qty 2

## 2023-05-25 MED ORDER — OXYCODONE HCL 5 MG PO TABS: 5.0000 mg | ORAL_TABLET | Freq: Once | ORAL | Status: DC | PRN

## 2023-05-25 MED ORDER — BUPIVACAINE IN DEXTROSE 0.75-8.25 % IT SOLN
INTRATHECAL | Status: DC | PRN
  Administered 2023-05-25: 1.6 mL via INTRATHECAL

## 2023-05-25 MED ORDER — ASPIRIN 81 MG PO TBEC: DELAYED_RELEASE_TABLET | ORAL | 0 refills | Status: DC

## 2023-05-25 MED ORDER — SODIUM CHLORIDE (PF) 0.9 % IJ SOLN
INTRAMUSCULAR | Status: AC
  Filled 2023-05-25: qty 10

## 2023-05-25 MED ORDER — OXYCODONE HCL 5 MG/5ML PO SOLN: 5.0000 mg | Freq: Once | ORAL | Status: DC | PRN

## 2023-05-25 MED ORDER — SODIUM CHLORIDE (PF) 0.9 % IJ SOLN
INTRAMUSCULAR | Status: AC
  Filled 2023-05-25: qty 50

## 2023-05-25 MED ORDER — CEFAZOLIN SODIUM-DEXTROSE 2-4 GM/100ML-% IV SOLN
2.0000 g | INTRAVENOUS | Status: AC
  Administered 2023-05-25: 2 g via INTRAVENOUS
  Filled 2023-05-25: qty 100

## 2023-05-25 MED ORDER — DEXAMETHASONE SODIUM PHOSPHATE 10 MG/ML IJ SOLN
8.0000 mg | Freq: Once | INTRAMUSCULAR | Status: AC
  Administered 2023-05-25: 8 mg via INTRAVENOUS

## 2023-05-25 SURGICAL SUPPLY — 46 items
ATTUNE MED DOME PAT 38 KNEE (Knees) IMPLANT
ATTUNE PS FEM RT SZ 6 CEM KNEE (Femur) IMPLANT
BAG COUNTER SPONGE SURGICOUNT (BAG) IMPLANT
BAG ZIPLOCK 12X15 (MISCELLANEOUS) ×1 IMPLANT
BASE TIBIA ATTUNE KNEE SYS SZ6 (Knees) IMPLANT
BLADE SAG 18X100X1.27 (BLADE) ×1 IMPLANT
BLADE SAW SGTL 11.0X1.19X90.0M (BLADE) ×1 IMPLANT
BNDG ELASTIC 6INX 5YD STR LF (GAUZE/BANDAGES/DRESSINGS) ×1 IMPLANT
BOWL SMART MIX CTS (DISPOSABLE) ×1 IMPLANT
CEMENT HV SMART SET (Cement) ×2 IMPLANT
COVER SURGICAL LIGHT HANDLE (MISCELLANEOUS) ×1 IMPLANT
CUFF TRNQT CYL 34X4.125X (TOURNIQUET CUFF) ×1 IMPLANT
DERMABOND ADVANCED .7 DNX12 (GAUZE/BANDAGES/DRESSINGS) ×1 IMPLANT
DRAPE U-SHAPE 47X51 STRL (DRAPES) ×1 IMPLANT
DRSG AQUACEL AG ADV 3.5X10 (GAUZE/BANDAGES/DRESSINGS) ×1 IMPLANT
DURAPREP 26ML APPLICATOR (WOUND CARE) ×1 IMPLANT
ELECT REM PT RETURN 15FT ADLT (MISCELLANEOUS) ×1 IMPLANT
GAUZE PAD ABD 8X10 STRL (GAUZE/BANDAGES/DRESSINGS) IMPLANT
GAUZE SPONGE 4X4 12PLY STRL (GAUZE/BANDAGES/DRESSINGS) IMPLANT
GLOVE BIO SURGEON STRL SZ 6.5 (GLOVE) IMPLANT
GLOVE BIO SURGEON STRL SZ8 (GLOVE) ×1 IMPLANT
GLOVE BIOGEL PI IND STRL 6.5 (GLOVE) IMPLANT
GLOVE BIOGEL PI IND STRL 7.0 (GLOVE) IMPLANT
GLOVE BIOGEL PI IND STRL 8 (GLOVE) ×1 IMPLANT
GOWN STRL REUS W/ TWL LRG LVL3 (GOWN DISPOSABLE) ×1 IMPLANT
HOLDER FOLEY CATH W/STRAP (MISCELLANEOUS) IMPLANT
IMMOBILIZER KNEE 20 (SOFTGOODS) ×1 IMPLANT
IMMOBILIZER KNEE 20 THIGH 36 (SOFTGOODS) ×1 IMPLANT
INSERT TIB ATTUNE RP SZ6X16 (Insert) IMPLANT
KIT TURNOVER KIT A (KITS) IMPLANT
MANIFOLD NEPTUNE II (INSTRUMENTS) ×1 IMPLANT
NS IRRIG 1000ML POUR BTL (IV SOLUTION) ×1 IMPLANT
PACK TOTAL KNEE CUSTOM (KITS) ×1 IMPLANT
PADDING CAST COTTON 6X4 STRL (CAST SUPPLIES) ×2 IMPLANT
PIN STEINMAN FIXATION KNEE (PIN) IMPLANT
PROTECTOR NERVE ULNAR (MISCELLANEOUS) ×1 IMPLANT
SET HNDPC FAN SPRY TIP SCT (DISPOSABLE) ×1 IMPLANT
SUT MNCRL AB 4-0 PS2 18 (SUTURE) ×1 IMPLANT
SUT STRATAFIX 0 PDS 27 VIOLET (SUTURE) ×1 IMPLANT
SUT VIC AB 2-0 CT1 TAPERPNT 27 (SUTURE) ×3 IMPLANT
SUTURE STRATFX 0 PDS 27 VIOLET (SUTURE) ×1 IMPLANT
TIBIA ATTUNE KNEE SYS BASE SZ6 (Knees) ×1 IMPLANT
TRAY FOLEY MTR SLVR 16FR STAT (SET/KITS/TRAYS/PACK) IMPLANT
TUBE SUCTION HIGH CAP CLEAR NV (SUCTIONS) ×1 IMPLANT
WATER STERILE IRR 1000ML POUR (IV SOLUTION) ×2 IMPLANT
WRAP KNEE MAXI GEL POST OP (GAUZE/BANDAGES/DRESSINGS) ×1 IMPLANT

## 2023-05-25 NOTE — Anesthesia Postprocedure Evaluation (Signed)
Anesthesia Post Note  Patient: Suzanne Andrews  Procedure(s) Performed: TOTAL KNEE ARTHROPLASTY (Right: Knee)     Patient location during evaluation: PACU Anesthesia Type: Regional and Spinal Level of consciousness: awake Pain management: pain level controlled Vital Signs Assessment: post-procedure vital signs reviewed and stable Respiratory status: spontaneous breathing, nonlabored ventilation and respiratory function stable Cardiovascular status: blood pressure returned to baseline and stable Postop Assessment: no apparent nausea or vomiting Anesthetic complications: no   No notable events documented.  Last Vitals:  Vitals:   05/25/23 1300 05/25/23 1345  BP: 137/86   Pulse: 77 76  Resp: 19 16  Temp:  36.7 C  SpO2: 99% 99%    Last Pain:  Vitals:   05/25/23 1345  TempSrc:   PainSc: 0-No pain                 Meghna Hagmann P Benancio Osmundson

## 2023-05-25 NOTE — Care Plan (Signed)
Ortho Bundle Case Management Note  Patient Details  Name: Suzanne Andrews MRN: [id number] Date of Birth: [birth year]  R TKA on 05-25-23 DCP:  Home with dtrs DME:  No needs, has a RW PT:  Monico Hoar on 05-29-23                   DME Arranged:  N/A DME Agency:  NA  HH Arranged:  NA HH Agency:  NA  Additional Comments: Please contact me with any questions of if this plan should need to change.  Ennis Forts, RN,CCM [facility]  [phone] 05/25/2023, 2:04 PM

## 2023-05-25 NOTE — Transfer of Care (Signed)
Immediate Anesthesia Transfer of Care Note  Patient: Suzanne Andrews  Procedure(s) Performed: TOTAL KNEE ARTHROPLASTY (Right: Knee)  Patient Location: PACU  Anesthesia Type:MAC and Spinal  Level of Consciousness: awake, alert , and oriented  Airway & Oxygen Therapy: Patient Spontanous Breathing and Patient connected to nasal cannula oxygen  Post-op Assessment: Report given to RN and Post -op Vital signs reviewed and stable  Post vital signs: Reviewed and stable  Last Vitals:  Vitals Value Taken Time  BP 108/70 05/25/23 1014  Temp    Pulse 83 05/25/23 1014  Resp 16 05/25/23 1014  SpO2 99 % 05/25/23 1014  Vitals shown include unfiled device data.  Last Pain:  Vitals:   05/25/23 0752  TempSrc: Oral         Complications: No notable events documented.

## 2023-05-25 NOTE — Anesthesia Procedure Notes (Signed)
Spinal  Patient location during procedure: OR Start time: 05/25/2023 8:20 AM End time: 05/25/2023 8:25 AM Reason for block: surgical anesthesia Staffing Performed: anesthesiologist  Anesthesiologist: Leonides Grills, MD Performed by: Leonides Grills, MD Authorized by: Leonides Grills, MD   Preanesthetic Checklist Completed: patient identified, IV checked, risks and benefits discussed, surgical consent, monitors and equipment checked, pre-op evaluation and timeout performed Spinal Block Patient position: sitting Prep: DuraPrep Patient monitoring: cardiac monitor, continuous pulse ox and blood pressure Approach: midline Location: L3-4 Injection technique: single-shot Needle Needle type: Pencan  Needle gauge: 24 G Needle length: 9 cm Assessment Sensory level: T10 Events: CSF return Additional Notes Functioning IV was confirmed and monitors were applied. Sterile prep and drape, including hand hygiene and sterile gloves were used. The patient was positioned and the spine was prepped. The skin was anesthetized with lidocaine.  Free flow of clear CSF was obtained prior to injecting local anesthetic into the CSF.  The spinal needle aspirated freely following injection.  The needle was carefully withdrawn.  The patient tolerated the procedure well.

## 2023-05-25 NOTE — Interval H&P Note (Signed)
History and Physical Interval Note:  05/25/2023 6:30 AM  Suzanne Andrews  has presented today for surgery, with the diagnosis of right knee osteoarthritis.  The various methods of treatment have been discussed with the patient and family. After consideration of risks, benefits and other options for treatment, the patient has consented to  Procedure(s): TOTAL KNEE ARTHROPLASTY (Right) as a surgical intervention.  The patient's history has been reviewed, patient examined, no change in status, stable for surgery.  I have reviewed the patient's chart and labs.  Questions were answered to the patient's satisfaction.     Suzanne Andrews

## 2023-05-25 NOTE — Discharge Instructions (Signed)
Suzanne Gross, MD Total Joint Specialist [facility] [address], [address] [place], [place] [address] [phone]  TOTAL KNEE REPLACEMENT POSTOPERATIVE DIRECTIONS    Knee Rehabilitation, Guidelines Following Surgery  Results after knee surgery are often greatly improved when you follow the exercise, range of motion and muscle strengthening exercises prescribed by your doctor. Safety measures are also important to protect the knee from further injury. If any of these exercises cause you to have increased pain or swelling in your knee joint, decrease the amount until you are comfortable again and slowly increase them. If you have problems or questions, call your caregiver or physical therapist for advice.   BLOOD CLOT PREVENTION Take 81 mg Aspirin two times a day for three weeks following surgery. Then take an 81 mg Aspirin once a day for three weeks. Then discontinue Aspirin. You may resume your vitamins/supplements upon discharge from the hospital. Do not take any NSAIDs (Advil, Aleve, Ibuprofen, Meloxicam, etc.) for 3 weeks, while taking 81mg  Aspirin twice a day.   HOME CARE INSTRUCTIONS  Remove items at home which could result in a fall. This includes throw rugs or furniture in walking pathways.  ICE to the affected knee as much as tolerated. Icing helps control swelling. If the swelling is well controlled you will be more comfortable and rehab easier. Continue to use ice on the knee for pain and swelling from surgery. You may notice swelling that will progress down to the foot and ankle. This is normal after surgery. Elevate the leg when you are not up walking on it.    Continue to use the breathing machine which will help keep your temperature down. It is common for your temperature to cycle up and down following surgery, especially at night when you are not up moving around and exerting yourself. The breathing machine keeps your lungs expanded and your temperature  down. Do not place pillow under the operative knee, focus on keeping the knee straight while resting  DIET You may resume your previous home diet once you are discharged from the hospital.  DRESSING / WOUND CARE / SHOWERING Keep your bulky bandage on for 2 days. On the third post-operative day you may change your dressing. Apply clean gauze to cover your incision and use paper tape to secure. You can purchase paper tape and gauze at your pharmacy. You may begin showering 3 days following surgery, but keep your incision dry by wrapping your knee in plastic and keeping it away from the water as best you can. Do not submerge the incision under water.  ACTIVITY For the first 5 days, the key is rest and control of pain and swelling Do your home exercises twice a day starting on post-operative day 3. On the days you go to physical therapy, just do the home exercises once that day. You should rest, ice and elevate the leg for 50 minutes out of every hour. Get up and walk/stretch for 10 minutes per hour. After 5 days you can increase your activity slowly as tolerated. Walk with your walker as instructed. Use the walker until you are comfortable transitioning to a cane. Walk with the cane in the opposite hand of the operative leg. You may discontinue the cane once you are comfortable and walking steadily. Avoid periods of inactivity such as sitting longer than an hour when not asleep. This helps prevent blood clots.  You may discontinue the knee immobilizer once you are able to perform a straight leg raise  while lying down. You may resume a sexual relationship in one month or when given the OK by your doctor.  You may return to work once you are cleared by your doctor.  Do not drive a car for 6 weeks or until released by your surgeon.  Do not drive while taking narcotics.  TED HOSE STOCKINGS Wear the elastic stockings on both legs for three weeks following surgery during the day. You may remove them at  night for sleeping.  WEIGHT BEARING Weight bearing as tolerated with assist device (walker, cane, etc) as directed, use it as long as suggested by your surgeon or therapist, typically at least 4-6 weeks.  POSTOPERATIVE CONSTIPATION PROTOCOL Constipation - defined medically as fewer than three stools per week and severe constipation as less than one stool per week.  One of the most common issues patients have following surgery is constipation.  Even if you have a regular bowel pattern at home, your normal regimen is likely to be disrupted due to multiple reasons following surgery.  Combination of anesthesia, postoperative narcotics, change in appetite and fluid intake all can affect your bowels.  In order to avoid complications following surgery, here are some recommendations in order to help you during your recovery period.  Colace (docusate) - Pick up an over-the-counter form of Colace or another stool softener and take twice a day as long as you are requiring postoperative pain medications.  Take with a full glass of water daily.  If you experience loose stools or diarrhea, hold the colace until you stool forms back up. If your symptoms do not get better within 1 week or if they get worse, check with your doctor. Dulcolax (bisacodyl) - Pick up over-the-counter and take as directed by the product packaging as needed to assist with the movement of your bowels.  Take with a full glass of water.  Use this product as needed if not relieved by Colace only.  MiraLax (polyethylene glycol) - Pick up over-the-counter to have on hand. MiraLax is a solution that will increase the amount of water in your bowels to assist with bowel movements.  Take as directed and can mix with a glass of water, juice, soda, coffee, or tea. Take if you go more than two days without a movement. Do not use MiraLax more than once per day. Call your doctor if you are still constipated or irregular after using this medication for 7 days  in a row.  If you continue to have problems with postoperative constipation, please contact the office for further assistance and recommendations.  If you experience "the worst abdominal pain ever" or develop nausea or vomiting, please contact the office immediatly for further recommendations for treatment.  ITCHING If you experience itching with your medications, try taking only a single pain pill, or even half a pain pill at a time.  You can also use Benadryl over the counter for itching or also to help with sleep.   MEDICATIONS See your medication summary on the "After Visit Summary" that the nursing staff will review with you prior to discharge.  You may have some home medications which will be placed on hold until you complete the course of blood thinner medication.  It is important for you to complete the blood thinner medication as prescribed by your surgeon.  Continue your approved medications as instructed at time of discharge.  PRECAUTIONS If you experience chest pain or shortness of breath - call 911 immediately for transfer to the hospital  emergency department.  If you develop a fever greater that 101 F, purulent drainage from wound, increased redness or drainage from wound, foul odor from the wound/dressing, or calf pain - CONTACT YOUR SURGEON.                                                   FOLLOW-UP APPOINTMENTS Make sure you keep all of your appointments after your operation with your surgeon and caregivers. You should call the office at the above phone number and make an appointment for approximately two weeks after the date of your surgery or on the date instructed by your surgeon outlined in the "After Visit Summary".  RANGE OF MOTION AND STRENGTHENING EXERCISES  Rehabilitation of the knee is important following a knee injury or an operation. After just a few days of immobilization, the muscles of the thigh which control the knee become weakened and shrink (atrophy). Knee  exercises are designed to build up the tone and strength of the thigh muscles and to improve knee motion. Often times heat used for twenty to thirty minutes before working out will loosen up your tissues and help with improving the range of motion but do not use heat for the first two weeks following surgery. These exercises can be done on a training (exercise) mat, on the floor, on a table or on a bed. Use what ever works the best and is most comfortable for you Knee exercises include:  Leg Lifts - While your knee is still immobilized in a splint or cast, you can do straight leg raises. Lift the leg to 60 degrees, hold for 3 sec, and slowly lower the leg. Repeat 10-20 times 2-3 times daily. Perform this exercise against resistance later as your knee gets better.  Quad and Hamstring Sets - Tighten up the muscle on the front of the thigh (Quad) and hold for 5-10 sec. Repeat this 10-20 times hourly. Hamstring sets are done by pushing the foot backward against an object and holding for 5-10 sec. Repeat as with quad sets.  Leg Slides: Lying on your back, slowly slide your foot toward your buttocks, bending your knee up off the floor (only go as far as is comfortable). Then slowly slide your foot back down until your leg is flat on the floor again. Angel Wings: Lying on your back spread your legs to the side as far apart as you can without causing discomfort.  A rehabilitation program following serious knee injuries can speed recovery and prevent re-injury in the future due to weakened muscles. Contact your doctor or a physical therapist for more information on knee rehabilitation.   POST-OPERATIVE OPIOID TAPER INSTRUCTIONS: It is important to wean off of your opioid medication as soon as possible. If you do not need pain medication after your surgery it is ok to stop day one. Opioids include: Codeine, Hydrocodone(Norco, Vicodin), Oxycodone(Percocet, oxycontin) and hydromorphone amongst others.  Long term and  even short term use of opiods can cause: Increased pain response Dependence Constipation Depression Respiratory depression And more.  Withdrawal symptoms can include Flu like symptoms Nausea, vomiting And more Techniques to manage these symptoms Hydrate well Eat regular healthy meals Stay active Use relaxation techniques(deep breathing, meditating, yoga) Do Not substitute Alcohol to help with tapering If you have been on opioids for less than two weeks and do not  have pain than it is ok to stop all together.  Plan to wean off of opioids This plan should start within one week post op of your joint replacement. Maintain the same interval or time between taking each dose and first decrease the dose.  Cut the total daily intake of opioids by one tablet each day Next start to increase the time between doses. The last dose that should be eliminated is the evening dose.   IF YOU ARE TRANSFERRED TO A SKILLED REHAB FACILITY If the patient is transferred to a skilled rehab facility following release from the hospital, a list of the current medications will be sent to the facility for the patient to continue.  When discharged from the skilled rehab facility, please have the facility set up the patient's Home Health Physical Therapy prior to being released. Also, the skilled facility will be responsible for providing the patient with their medications at time of release from the facility to include their pain medication, the muscle relaxants, and their blood thinner medication. If the patient is still at the rehab facility at time of the two week follow up appointment, the skilled rehab facility will also need to assist the patient in arranging follow up appointment in our office and any transportation needs.  MAKE SURE YOU:  Understand these instructions.  Get help right away if you are not doing well or get worse.   DENTAL ANTIBIOTICS:  In most cases prophylactic antibiotics for Dental  procdeures after total joint surgery are not necessary.  Exceptions are as follows:  1. History of prior total joint infection  2. Severely immunocompromised (Organ Transplant, cancer chemotherapy, Rheumatoid biologic meds such as Humera)  3. Poorly controlled diabetes (A1C &gt; 8.0, blood glucose over 200)  If you have one of these conditions, contact your surgeon for an antibiotic prescription, prior to your dental procedure.    Pick up stool softner and laxative for home use following surgery while on pain medications. Do not submerge incision under water. Please use good hand washing techniques while changing dressing each day. May shower starting three days after surgery. Please use a clean towel to pat the incision dry following showers. Continue to use ice for pain and swelling after surgery. Do not use any lotions or creams on the incision until instructed by your surgeon.

## 2023-05-25 NOTE — Anesthesia Preprocedure Evaluation (Addendum)
Anesthesia Evaluation  Patient identified by MRN, date of birth, ID band Patient awake    Reviewed: Allergy & Precautions, NPO status , Patient's Chart, lab work & pertinent test results  Airway Mallampati: II  TM Distance: >3 FB Neck ROM: Full    Dental  (+) Missing   Pulmonary neg pulmonary ROS   Pulmonary exam normal        Cardiovascular hypertension, Pt. on medications + Peripheral Vascular Disease  Normal cardiovascular exam     Neuro/Psych negative neurological ROS  negative psych ROS   GI/Hepatic negative GI ROS, Neg liver ROS,,,  Endo/Other  negative endocrine ROS    Renal/GU negative Renal ROS     Musculoskeletal  (+) Arthritis ,    Abdominal  (+) + obese  Peds  Hematology negative hematology ROS (+)   Anesthesia Other Findings right knee osteoarthritis  Reproductive/Obstetrics                             Anesthesia Physical Anesthesia Plan  ASA: 2  Anesthesia Plan: Spinal and Regional   Post-op Pain Management: Regional block*   Induction: Intravenous  PONV Risk Score and Plan: 2 and Ondansetron, Dexamethasone, Propofol infusion, Midazolam and Treatment may vary due to age or medical condition  Airway Management Planned: Simple Face Mask  Additional Equipment:   Intra-op Plan:   Post-operative Plan:   Informed Consent: I have reviewed the patients History and Physical, chart, labs and discussed the procedure including the risks, benefits and alternatives for the proposed anesthesia with the patient or authorized representative who has indicated his/her understanding and acceptance.     Dental advisory given  Plan Discussed with: CRNA  Anesthesia Plan Comments:        Anesthesia Quick Evaluation

## 2023-05-25 NOTE — Anesthesia Procedure Notes (Signed)
Anesthesia Regional Block: Adductor canal block   Pre-Anesthetic Checklist: , timeout performed,  Correct Patient, Correct Site, Correct Laterality,  Correct Procedure,, site marked,  Risks and benefits discussed,  Surgical consent,  Pre-op evaluation,  At surgeon's request and post-op pain management  Laterality: Right  Prep: chloraprep       Needles:  Injection technique: Single-shot  Needle Type: Echogenic Stimulator Needle     Needle Length: 10cm  Needle Gauge: 20   Needle insertion depth: 10 cm   Additional Needles:   Procedures:,,,, ultrasound used (permanent image in chart),,     Nerve Stimulator or Paresthesia:   Additional Responses:  Ultrasound depth: 6.0 cm Narrative:  Start time: 05/25/2023 7:50 AM End time: 05/25/2023 8:00 AM Injection made incrementally with aspirations every 5 mL.  Performed by: Personally  Anesthesiologist: Leonides Grills, MD  Additional Notes: Functioning IV was confirmed and monitors were applied. A time-out was performed. Hand hygiene and sterile gloves were used. The thigh was placed in a frog-leg position and prepped in a sterile fashion. A 20ga Bbraun echogenic stimulator needle was placed using ultrasound guidance.  Negative aspiration and negative test dose prior to incremental administration of local anesthetic. The patient tolerated the procedure well.

## 2023-05-25 NOTE — Evaluation (Signed)
Physical Therapy Evaluation Patient Details Name: Suzanne Andrews MRN: [id number] DOB: 03/17/1966 Today's Date: 05/25/2023  History of Present Illness  57 yo female presents to therapy s/p R TKA on 05/25/2023 due to failure of conservative measures. Pt PMH includes but is not limited to: onychomycosis, back pain , venous insufficiency, capillary hemangioma, HTN, PVD and L TKA on 02/09/2023.  Clinical Impression      DAURICE SHADDIX is a 57 y.o. female POD 0 s/p R TKA. Patient reports mod I with mobility at baseline. Patient is now limited by functional impairments (see PT problem list below) and requires CGA and cues for transfers and gait with RW. Patient was able to ambulate 50 and 40 feet with RW and CGA progressing to close S and cues for safe walker management. Patient educated on safe sequencing for functional mobility tasks, fall risk prevention, pain management and goal, use of CP/ice, and car transfers pt and daughter verbalized safe guarding position for people assisting with mobility. Patient instructed in exercises to facilitate ROM and circulation reviewed and HO provided.  Patient will benefit from continued skilled PT interventions to address impairments and progress towards PLOF. Patient has met mobility goals at adequate level for discharge home with family support and OPPT services; will continue to follow if pt continues acute stay to progress towards Mod I goals.     If plan is discharge home, recommend the following: A little help with walking and/or transfers;A little help with bathing/dressing/bathroom;Assistance with cooking/housework;Assist for transportation;Help with stairs or ramp for entrance   Can travel by private vehicle        Equipment Recommendations None recommended by PT  Recommendations for Other Services       Functional Status Assessment Patient has had a recent decline in their functional status and demonstrates the ability to make significant  improvements in function in a reasonable and predictable amount of time.     Precautions / Restrictions Precautions Precautions: Knee;Fall Restrictions Weight Bearing Restrictions Per Provider Order: No      Mobility  Bed Mobility Overal bed mobility: Needs Assistance Bed Mobility: Supine to Sit     Supine to sit: Supervision, HOB elevated     General bed mobility comments: min cues    Transfers Overall transfer level: Needs assistance Equipment used: Rolling walker (2 wheels) Transfers: Sit to/from Stand Sit to Stand: Contact guard assist           General transfer comment: min cues for safety with bed, recliner and commode transfers    Ambulation/Gait Ambulation/Gait assistance: Contact guard assist, Supervision Gait Distance (Feet): 50 Feet Assistive device: Rolling walker (2 wheels) Gait Pattern/deviations: Step-to pattern, Antalgic Gait velocity: decreased     General Gait Details: min cues with step almost through pattern  Stairs            Wheelchair Mobility     Tilt Bed    Modified Rankin (Stroke Patients Only)       Balance Overall balance assessment: Needs assistance Sitting-balance support: Feet supported Sitting balance-Leahy Scale: Good     Standing balance support: Bilateral upper extremity supported, During functional activity, Reliant on assistive device for balance Standing balance-Leahy Scale: Fair Standing balance comment: static standing no UE support                             Pertinent Vitals/Pain Pain Assessment Pain Assessment: 0-10 Pain Score: 1  Pain Location: R Knee  and LE Pain Descriptors / Indicators:  (stinging) Pain Intervention(s): Limited activity within patient's tolerance, Monitored during session, Premedicated before session, Repositioned, Ice applied    Home Living Family/patient expects to be discharged to:: Private residence Living Arrangements: Children Available Help at  Discharge: Family Type of Home: House Home Access: Ramped entrance       Home Layout: One level Home Equipment: Patent examiner (4 wheels);Rolling Walker (2 wheels);Cane - single point      Prior Function Prior Level of Function : Independent/Modified Independent             Mobility Comments: mod I with intermitent use of SPC for all ADLs, self care tasks and IADLs       Extremity/Trunk Assessment        Lower Extremity Assessment Lower Extremity Assessment: RLE deficits/detail RLE Deficits / Details: ankle DF/PF 5/5 RLE Sensation: WNL    Cervical / Trunk Assessment Cervical / Trunk Assessment: Normal  Communication   Communication Communication: No apparent difficulties  Cognition Arousal: Alert Behavior During Therapy: WFL for tasks assessed/performed Overall Cognitive Status: Within Functional Limits for tasks assessed                                          General Comments      Exercises Total Joint Exercises Ankle Circles/Pumps: AROM, Both, 10 reps Quad Sets: AROM, Right, 5 reps Gluteal Sets: AROM, Right, 5 reps Short Arc Quad: AROM, Right, 5 reps Heel Slides: AROM, Right, 5 reps Hip ABduction/ADduction: AROM, Right, 5 reps Straight Leg Raises: AROM, Right, 5 reps Knee Flexion: AROM, Right, 5 reps, Seated   Assessment/Plan    PT Assessment Patient needs continued PT services  PT Problem List Decreased strength;Decreased range of motion;Decreased activity tolerance;Decreased balance;Decreased mobility;Decreased coordination;Pain       PT Treatment Interventions DME instruction;Gait training;Functional mobility training;Therapeutic activities;Therapeutic exercise;Balance training;Neuromuscular re-education;Patient/family education;Modalities    PT Goals (Current goals can be found in the Care Plan section)  Acute Rehab PT Goals Patient Stated Goal: to be able to get up and down without using my arms, become more active  with walking and step navigation and to move as if the knee surgeries never happened PT Goal Formulation: With patient Time For Goal Achievement: 06/12/23 Potential to Achieve Goals: Good    Frequency 7X/week     Co-evaluation               AM-PAC PT "6 Clicks" Mobility  Outcome Measure Help needed turning from your back to your side while in a flat bed without using bedrails?: None Help needed moving from lying on your back to sitting on the side of a flat bed without using bedrails?: None Help needed moving to and from a bed to a chair (including a wheelchair)?: A Little Help needed standing up from a chair using your arms (e.g., wheelchair or bedside chair)?: A Little Help needed to walk in hospital room?: A Little Help needed climbing 3-5 steps with a railing? : A Little 6 Click Score: 20    End of Session Equipment Utilized During Treatment: Gait belt Activity Tolerance: No increased pain;Patient tolerated treatment well Patient left: in chair;with call bell/phone within reach;with family/visitor present Nurse Communication: Mobility status;Other (comment) (Pt readiness for d/c from PT standpoint) PT Visit Diagnosis: Unsteadiness on feet (R26.81);Other abnormalities of gait and mobility (R26.89);Muscle weakness (generalized) (M62.81);Difficulty in walking, not  elsewhere classified (R26.2);Pain Pain - Right/Left: Right Pain - part of body: Knee;Leg    Time: 1610-9604 PT Time Calculation (min) (ACUTE ONLY): 38 min   Charges:   PT Evaluation $PT Eval Low Complexity: 1 Low PT Treatments $Gait Training: 8-22 mins $Therapeutic Exercise: 8-22 mins PT General Charges $$ ACUTE PT VISIT: 1 Visit         Johnny Bridge, PT Acute Rehab   Jacqualyn Posey 05/25/2023, 2:27 PM

## 2023-05-25 NOTE — Op Note (Signed)
OPERATIVE REPORT-TOTAL KNEE ARTHROPLASTY   Pre-operative diagnosis- Osteoarthritis  Right knee(s)  Post-operative diagnosis- Osteoarthritis Right knee(s)  Procedure-  Right  Total Knee Arthroplasty  Surgeon- Gus Rankin. Teralyn Mullins, MD  Assistant- Weston Brass, PA-C   Anesthesia-   Adductor canal block and spinal  EBL-10 mL   Drains None  Tourniquet time-  Total Tourniquet Time Documented: Thigh (Right) - 47 minutes Total: Thigh (Right) - 47 minutes     Complications- None  Condition-PACU - hemodynamically stable.   Brief Clinical Note  Suzanne Andrews is a 57 y.o. year old female with end stage OA of her right knee with progressively worsening pain and dysfunction. She has constant pain, with activity and at rest and significant functional deficits with difficulties even with ADLs. She has had extensive non-op management including analgesics, injections of cortisone and viscosupplements, and home exercise program, but remains in significant pain with significant dysfunction.Radiographs show bone on bone arthritis all 3 compartments. She presents now for right Total Knee Arthroplasty.     Procedure in detail---   The patient is brought into the operating room and positioned supine on the operating table. After successful administration of  Adductor canal block and spinal,   a tourniquet is placed high on the  Right thigh(s) and the lower extremity is prepped and draped in the usual sterile fashion. Time out is performed by the operating team and then the  Right lower extremity is wrapped in Esmarch, knee flexed and the tourniquet inflated to 300 mmHg.       A midline incision is made with a ten blade through the subcutaneous tissue to the level of the extensor mechanism. A fresh blade is used to make a medial parapatellar arthrotomy. Soft tissue over the proximal medial tibia is subperiosteally elevated to the joint line with a knife and into the semimembranosus bursa with a Cobb  elevator. Soft tissue over the proximal lateral tibia is elevated with attention being paid to avoiding the patellar tendon on the tibial tubercle. The patella is everted, knee flexed 90 degrees and the ACL and PCL are removed. Findings are bone on bone all 3 compartments with massive global osteophytes        The drill is used to create a starting hole in the distal femur and the canal is thoroughly irrigated with sterile saline to remove the fatty contents. The 5 degree Right  valgus alignment guide is placed into the femoral canal and the distal femoral cutting block is pinned to remove 9 mm off the distal femur. Resection is made with an oscillating saw.      The tibia is subluxed forward and the menisci are removed. The extramedullary alignment guide is placed referencing proximally at the medial aspect of the tibial tubercle and distally along the second metatarsal axis and tibial crest. The block is pinned to remove 2mm off the more deficient medial  side. Resection is made with an oscillating saw. Size 6is the most appropriate size for the tibia and the proximal tibia is prepared with the modular drill and keel punch for that size.      The femoral sizing guide is placed and size 6 is most appropriate. Rotation is marked off the epicondylar axis and confirmed by creating a rectangular flexion gap at 90 degrees. The size 6 cutting block is pinned in this rotation and the anterior, posterior and chamfer cuts are made with the oscillating saw. The intercondylar block is then placed and that cut is made.  Trial size 6 tibial component, trial size 6 posterior stabilized femur and a 16  mm posterior stabilized rotating platform insert trial is placed. Full extension is achieved with excellent varus/valgus and anterior/posterior balance throughout full range of motion. The patella is everted and thickness measured to be 22  mm. Free hand resection is taken to 12 mm, a 38 template is placed, lug holes are  drilled, trial patella is placed, and it tracks normally. Osteophytes are removed off the posterior femur with the trial in place. All trials are removed and the cut bone surfaces prepared with pulsatile lavage. Cement is mixed and once ready for implantation, the size 6 tibial implant, size  6 posterior stabilized femoral component, and the size 38 patella are cemented in place and the patella is held with the clamp. The trial insert is placed and the knee held in full extension. The Exparel (20 ml mixed with 60 ml saline) is injected into the extensor mechanism, posterior capsule, medial and lateral gutters and subcutaneous tissues.  All extruded cement is removed and once the cement is hard the permanent 16 mm posterior stabilized rotating platform insert is placed into the tibial tray.      The wound is copiously irrigated with saline solution and the extensor mechanism closed with # 0 Stratofix suture. The tourniquet is released for a total tourniquet time of 47  minutes. Flexion against gravity is 130 degrees and the patella tracks normally. Subcutaneous tissue is closed with 2.0 vicryl and subcuticular with running 4.0 Monocryl. The incision is cleaned and dried and steri-strips and a bulky sterile dressing are applied. The limb is placed into a knee immobilizer and the patient is awakened and transported to recovery in stable condition.      Please note that a surgical assistant was a medical necessity for this procedure in order to perform it in a safe and expeditious manner. Surgical assistant was necessary to retract the ligaments and vital neurovascular structures to prevent injury to them and also necessary for proper positioning of the limb to allow for anatomic placement of the prosthesis.   Gus Rankin Bobbijo Holst, MD    05/25/2023, 9:42 AM

## 2023-05-26 ENCOUNTER — Encounter (HOSPITAL_COMMUNITY): Payer: Self-pay | Admitting: Orthopedic Surgery

## 2023-05-29 DIAGNOSIS — M25561 Pain in right knee: Secondary | ICD-10-CM | POA: Diagnosis not present

## 2023-05-29 DIAGNOSIS — M25661 Stiffness of right knee, not elsewhere classified: Secondary | ICD-10-CM | POA: Diagnosis not present

## 2023-06-01 DIAGNOSIS — M25661 Stiffness of right knee, not elsewhere classified: Secondary | ICD-10-CM | POA: Diagnosis not present

## 2023-06-01 DIAGNOSIS — M25561 Pain in right knee: Secondary | ICD-10-CM | POA: Diagnosis not present

## 2023-06-08 DIAGNOSIS — M25661 Stiffness of right knee, not elsewhere classified: Secondary | ICD-10-CM | POA: Diagnosis not present

## 2023-06-08 DIAGNOSIS — M25561 Pain in right knee: Secondary | ICD-10-CM | POA: Diagnosis not present

## 2023-06-14 ENCOUNTER — Telehealth: Payer: Medicare HMO | Admitting: Family

## 2023-06-14 DIAGNOSIS — J069 Acute upper respiratory infection, unspecified: Secondary | ICD-10-CM | POA: Diagnosis not present

## 2023-06-14 MED ORDER — FLUTICASONE PROPIONATE 50 MCG/ACT NA SUSP: 2.0000 | Freq: Every day | NASAL | 6 refills | Status: DC

## 2023-06-14 MED ORDER — PROMETHAZINE-DM 6.25-15 MG/5ML PO SYRP: 5.0000 mL | ORAL_SOLUTION | Freq: Three times a day (TID) | ORAL | 0 refills | Status: DC | PRN

## 2023-06-14 MED ORDER — GUAIFENESIN ER 600 MG PO TB12: 1200.0000 mg | ORAL_TABLET | Freq: Two times a day (BID) | ORAL | 1 refills | Status: DC | PRN

## 2023-06-14 MED ORDER — BENZONATATE 100 MG PO CAPS: 100.0000 mg | ORAL_CAPSULE | Freq: Three times a day (TID) | ORAL | 0 refills | Status: DC | PRN

## 2023-06-14 NOTE — Patient Instructions (Signed)

## 2023-06-14 NOTE — Progress Notes (Signed)
 Virtual Visit Consent   Suzanne Andrews, you are scheduled for a virtual visit with a [facility] provider today. Just as with appointments in the office, your consent must be obtained to participate. Your consent will be active for this visit and any virtual visit you may have with one of our providers in the next 365 days. If you have a MyChart account, a copy of this consent can be sent to you electronically.  As this is a virtual visit, video technology does not allow for your provider to perform a traditional examination. This may limit your provider's ability to fully assess your condition. If your provider identifies any concerns that need to be evaluated in person or the need to arrange testing (such as labs, EKG, etc.), we will make arrangements to do so. Although advances in technology are sophisticated, we cannot ensure that it will always work on either your end or our end. If the connection with a video visit is poor, the visit may have to be switched to a telephone visit. With either a video or telephone visit, we are not always able to ensure that we have a secure connection.  By engaging in this virtual visit, you consent to the provision of healthcare and authorize for your insurance to be billed (if applicable) for the services provided during this visit. Depending on your insurance coverage, you may receive a charge related to this service.  I need to obtain your verbal consent now. Are you willing to proceed with your visit today? Suzanne Andrews has provided verbal consent on 06/14/2023 for a virtual visit (video or telephone). Suzanne Learn, FNP  Date: 06/14/2023 11:39 AM  Virtual Visit via Video Note   I, Suzanne Andrews, connected with  Suzanne Andrews  ([id number], December 02, 1965) on 06/14/23 at 11:30 AM EST by a video-enabled telemedicine application and verified that I am speaking with the correct person using two identifiers.  Location: Patient: Virtual Visit Location Patient:  Home Provider: Virtual Visit Location Provider: Home Office   I discussed the limitations of evaluation and management by telemedicine and the availability of in person appointments. The patient expressed understanding and agreed to proceed.    History of Present Illness: Suzanne Andrews is a 58 y.o. who identifies as a female who was assigned female at birth, and is being seen today for cough, sinus congestion that started two days ago.  HPI: Sinusitis This is a new problem. The current episode started in the past 7 days. The problem has been gradually worsening since onset. There has been no fever. Her pain is at a severity of 9/10. The pain is mild. Associated symptoms include congestion, coughing, ear pain, headaches, a hoarse voice, sinus pressure and sneezing. Pertinent negatives include no shortness of breath or sore throat. Past treatments include oral decongestants (Claritin). The treatment provided mild relief.    Problems:  Patient Active Problem List   Diagnosis Date Noted   OA (osteoarthritis) of knee 02/09/2023   Annual visit for general adult medical examination with abnormal findings 11/27/2022   Pre-op examination 11/27/2022   Onychomycosis 11/27/2022   Nausea 10/09/2022   Screening due 06/14/2020   Back pain 08/06/2019   Venous insufficiency of both lower extremities 12/17/2016   Right knee pain 08/02/2015   Pain in joint, ankle and foot 05/15/2015   Ovarian cyst, left 04/04/2011   Prediabetes 04/04/2011   Benign neoplasm of skin 08/02/2009   Hemangioma 08/02/2009   Osteoarthritis, knee 08/02/2009  Allergic rhinitis 11/21/2008   Morbid obesity (HCC) 02/01/2008   Essential hypertension 02/01/2008    Allergies:  Allergies  Allergen Reactions   Septra  [Sulfamethoxazole -Trimethoprim ] Rash   Bentyl  [Dicyclomine ] Other (See Comments)    Reports caused floaters and constipation   Latex Itching and Swelling    Band aid   Zofran  [Ondansetron ] Other (See Comments)     Reports caused floaters and constipation   Medications:  Current Outpatient Medications:    benzonatate  (TESSALON  PERLES) 100 MG capsule, Take 1 capsule (100 mg total) by mouth 3 (three) times daily as needed., Disp: 20 capsule, Rfl: 0   fluticasone  (FLONASE ) 50 MCG/ACT nasal spray, Place 2 sprays into both nostrils daily., Disp: 16 g, Rfl: 6   guaiFENesin  (MUCINEX ) 600 MG 12 hr tablet, Take 2 tablets (1,200 mg total) by mouth 2 (two) times daily as needed., Disp: 60 tablet, Rfl: 1   promethazine -dextromethorphan (PROMETHAZINE -DM) 6.25-15 MG/5ML syrup, Take 5 mLs by mouth 3 (three) times daily as needed for cough., Disp: 118 mL, Rfl: 0   aspirin  EC 81 MG tablet, Then take one 81 mg aspirin  twice a day for three weeks. Then take once a day for 3 weeks. Then discontinue aspirin ., Disp: 63 tablet, Rfl: 0   gabapentin  (NEURONTIN ) 300 MG capsule, Take a 300 mg capsule three times a day for two weeks following surgery.Then take a 300 mg capsule two times a day for two weeks. Then take a 300 mg capsule once a day for two weeks. Then discontinue., Disp: 84 capsule, Rfl: 0   lisinopril -hydrochlorothiazide  (ZESTORETIC ) 20-25 MG tablet, Take 1 tablet by mouth daily., Disp: 90 tablet, Rfl: 3   loratadine (CLARITIN) 10 MG tablet, Take 10 mg by mouth daily as needed for allergies., Disp: , Rfl:    methocarbamol  (ROBAXIN ) 500 MG tablet, Take 1 tablet (500 mg total) by mouth every 6 (six) hours as needed., Disp: 30 tablet, Rfl: 0   Multiple Vitamin (MULTIVITAMIN WITH MINERALS) TABS tablet, Take 1 tablet by mouth daily., Disp: , Rfl:    oxyCODONE  (ROXICODONE ) 5 MG immediate release tablet, Take 1-2 tablets (5-10 mg total) by mouth every 8 (eight) hours as needed for severe pain (pain score 7-10) (s/p R total knee arthroplasty on 05/25/23)., Disp: 30 tablet, Rfl: 0  Observations/Objective: Patient is well-developed, well-nourished in no acute distress.  Resting comfortably  at home.  Head is normocephalic,  atraumatic.  No labored breathing.  Speech is clear and coherent with logical content.  Patient is alert and oriented at baseline.  Dry nonproductive cough Nasal congestion   Assessment and Plan: 1. Viral URI with cough (Primary) - fluticasone  (FLONASE ) 50 MCG/ACT nasal spray; Place 2 sprays into both nostrils daily.  Dispense: 16 g; Refill: 6 - benzonatate  (TESSALON  PERLES) 100 MG capsule; Take 1 capsule (100 mg total) by mouth 3 (three) times daily as needed.  Dispense: 20 capsule; Refill: 0 - promethazine -dextromethorphan (PROMETHAZINE -DM) 6.25-15 MG/5ML syrup; Take 5 mLs by mouth 3 (three) times daily as needed for cough.  Dispense: 118 mL; Refill: 0 - guaiFENesin  (MUCINEX ) 600 MG 12 hr tablet; Take 2 tablets (1,200 mg total) by mouth 2 (two) times daily as needed.  Dispense: 60 tablet; Refill: 1  - Take meds as prescribed - Use a cool mist humidifier  -Use saline nose sprays frequently -Force fluids -For any cough or congestion  Use plain Mucinex - regular strength or max strength is fine -For fever or aces or pains- take tylenol  or ibuprofen . -Throat lozenges if help -  Follow up if symptoms worsen or do not improve   Follow Up Instructions: I discussed the assessment and treatment plan with the patient. The patient was provided an opportunity to ask questions and all were answered. The patient agreed with the plan and demonstrated an understanding of the instructions.  A copy of instructions were sent to the patient via MyChart unless otherwise noted below.     The patient was advised to call back or seek an in-person evaluation if the symptoms worsen or if the condition fails to improve as anticipated.    Suzanne Learn, FNP

## 2023-06-16 ENCOUNTER — Other Ambulatory Visit: Payer: Self-pay | Admitting: Family Medicine

## 2023-06-18 ENCOUNTER — Ambulatory Visit (INDEPENDENT_AMBULATORY_CARE_PROVIDER_SITE_OTHER): Payer: Medicare HMO | Admitting: Family Medicine

## 2023-06-18 ENCOUNTER — Encounter: Payer: Self-pay | Admitting: Family Medicine

## 2023-06-18 VITALS — BP 116/80 | HR 102 | Ht 67.0 in | Wt 243.1 lb

## 2023-06-18 DIAGNOSIS — E559 Vitamin D deficiency, unspecified: Secondary | ICD-10-CM

## 2023-06-18 DIAGNOSIS — J209 Acute bronchitis, unspecified: Secondary | ICD-10-CM

## 2023-06-18 DIAGNOSIS — I10 Essential (primary) hypertension: Secondary | ICD-10-CM

## 2023-06-18 DIAGNOSIS — R7303 Prediabetes: Secondary | ICD-10-CM

## 2023-06-18 DIAGNOSIS — J0110 Acute frontal sinusitis, unspecified: Secondary | ICD-10-CM

## 2023-06-18 MED ORDER — AZITHROMYCIN 250 MG PO TABS: ORAL_TABLET | ORAL | 0 refills | Status: AC

## 2023-06-18 MED ORDER — PHENTERMINE HCL 37.5 MG PO TABS: 37.5000 mg | ORAL_TABLET | Freq: Every day | ORAL | 0 refills | Status: DC

## 2023-06-18 MED ORDER — LISINOPRIL 20 MG PO TABS: 20.0000 mg | ORAL_TABLET | Freq: Every day | ORAL | 3 refills | Status: DC

## 2023-06-18 NOTE — Progress Notes (Signed)
Suzanne Andrews     MRN: [id number]      DOB: Dec 04, 1965  Chief Complaint  Patient presents with   Follow-up    Follow up knee surgery in December, cold did a vitrual visit on Sunday diagnosed with upper respiratory infection still draining.    HPI Ms. Romanski is here for for f/u recent onset of acute head and chest congestion started 1/10, got tessalon perles, phenergan aDM and flonase repoprts 50 % improvement Excess sweating no documented fever or  no generalized body aches , no known sick contact C/o bi. The PT denies any adverse reactions to current medications since the last visit.  Recovering well from knee replacement  ROS  Denies chest pains, palpitations and leg swelling Denies abdominal pain, nausea, vomiting,diarrhea or constipation.   Denies dysuria, frequency, hesitancy or incontinence. Improved joint pain and mobility Denies headaches, seizures, numbness, or tingling. Denies depression, anxiety or insomnia. Denies skin break down or rash.   PE  BP 116/80 (BP Location: Right Arm, Patient Position: Sitting, Cuff Size: Large)   Pulse (!) 102   Ht 5\' 7"  (1.702 m)   Wt 243 lb 1.3 oz (110.3 kg)   LMP 08/12/2014   SpO2 97%   BMI 38.07 kg/m   Patient alert and oriented and in no cardiopulmonary distress.  HEENT: No facial asymmetry, EOMI,     Neck supple .Frontal sinus tenderness  Chest: bilateral crackles, no wheezes.  CVS: S1, S2 no murmurs, no S3.Regular rate.  ABD: Soft non tender.   Ext: No edema  MS: Adequate ROM spine, shoulders, hips and reduced in  knee.  Skin: Intact, no ulcerations or rash noted.  Psych: Good eye contact, normal affect. Memory intact not anxious or depressed appearing.  CNS: CN 2-12 intact, power,  normal throughout.no focal deficits noted.   Assessment & Plan  Acute sinusitis Z pack prescribed  Acute bronchitis with COPD (HCC) Z pack and symptomatic treatment to continue  Acute bronchitis Z pack and symptomatic  treatment  Essential hypertension Controlled, no change in medication DASH diet and commitment to daily physical activity for a minimum of 30 minutes discussed and encouraged, as a part of hypertension management. The importance of attaining a healthy weight is also discussed.     07/02/2023    1:07 PM 06/18/2023    1:07 PM 05/25/2023    1:00 PM 05/25/2023   12:45 PM 05/25/2023   12:30 PM 05/25/2023   12:15 PM 05/25/2023   12:01 PM  BP/Weight  Systolic BP 144 116 137 135 127 139 130  Diastolic BP 84 80 86 84 86 91 87  Wt. (Lbs) 254 243.08       BMI 39.78 kg/m2 38.07 kg/m2            Morbid obesity (HCC)  Patient re-educated about  the importance of commitment to a  minimum of 150 minutes of exercise per week as able.  The importance of healthy food choices with portion control discussed, as well as eating regularly and within a 12 hour window most days. The need to choose "clean , green" food 50 to 75% of the time is discussed, as well as to make water the primary drink and set a goal of 64 ounces water daily.       07/02/2023    1:07 PM 06/18/2023    1:07 PM 05/25/2023    6:25 AM  Weight /BMI  Weight 254 lb 243 lb 1.3 oz 240 lb  Height  5\' 7"  (1.702 m) 5\' 7"  (1.702 m) 5\' 7"  (1.702 m)  BMI 39.78 kg/m2 38.07 kg/m2 37.59 kg/m2    Deteriorated, resume half phentermine daily Feb 1

## 2023-06-18 NOTE — Patient Instructions (Addendum)
F/U in 10 weeks , call if you need me sooner  Nurse visit in 2 weeks for flu vaccine   Azithromycin is prescribed for respiratory infection  Lipid panel, TSH, vit D and HBA1C today  Start half phentermine once daily Feb 1 as we discussed  Thankful that bilarteral knee surgery has been a success  Congrats on excellent weight loss  Best for 2025!

## 2023-06-19 ENCOUNTER — Encounter: Payer: Self-pay | Admitting: Family Medicine

## 2023-06-19 LAB — LIPID PANEL
Chol/HDL Ratio: 3.8 {ratio} (ref 0.0–4.4)
Cholesterol, Total: 167 mg/dL (ref 100–199)
HDL: 44 mg/dL (ref >39)
LDL Chol Calc (NIH): 109 mg/dL — ABNORMAL HIGH (ref 0–99)
Triglycerides: 74 mg/dL (ref 0–149)
VLDL Cholesterol Cal: 14 mg/dL (ref 5–40)

## 2023-06-19 LAB — HEMOGLOBIN A1C
Est. average glucose Bld gHb Est-mCnc: 114 mg/dL
Hgb A1c MFr Bld: 5.6 % (ref 4.8–5.6)

## 2023-06-19 LAB — TSH: TSH: 1.03 u[IU]/mL (ref 0.450–4.500)

## 2023-06-19 LAB — VITAMIN D 25 HYDROXY (VIT D DEFICIENCY, FRACTURES): Vit D, 25-Hydroxy: 48.8 ng/mL (ref 30.0–100.0)

## 2023-06-29 DIAGNOSIS — M25561 Pain in right knee: Secondary | ICD-10-CM | POA: Diagnosis not present

## 2023-06-29 DIAGNOSIS — M25661 Stiffness of right knee, not elsewhere classified: Secondary | ICD-10-CM | POA: Diagnosis not present

## 2023-07-01 DIAGNOSIS — Z5189 Encounter for other specified aftercare: Secondary | ICD-10-CM | POA: Diagnosis not present

## 2023-07-02 ENCOUNTER — Ambulatory Visit: Payer: Medicare HMO

## 2023-07-02 NOTE — Progress Notes (Signed)
Pt came in for Flu shot to day. She received immunization w/o complication. Her B/P was high she expressed her readings were normal at home .However she was in pain today in her knee on which she recently had surgery. Declined second B/P recheck before leaving office.

## 2023-07-03 DIAGNOSIS — M25561 Pain in right knee: Secondary | ICD-10-CM | POA: Diagnosis not present

## 2023-07-03 DIAGNOSIS — M25661 Stiffness of right knee, not elsewhere classified: Secondary | ICD-10-CM | POA: Diagnosis not present

## 2023-07-07 DIAGNOSIS — M25561 Pain in right knee: Secondary | ICD-10-CM | POA: Diagnosis not present

## 2023-07-07 DIAGNOSIS — M25661 Stiffness of right knee, not elsewhere classified: Secondary | ICD-10-CM | POA: Diagnosis not present

## 2023-07-10 DIAGNOSIS — M25561 Pain in right knee: Secondary | ICD-10-CM | POA: Diagnosis not present

## 2023-07-10 DIAGNOSIS — M25661 Stiffness of right knee, not elsewhere classified: Secondary | ICD-10-CM | POA: Diagnosis not present

## 2023-07-14 DIAGNOSIS — M25561 Pain in right knee: Secondary | ICD-10-CM | POA: Diagnosis not present

## 2023-07-14 DIAGNOSIS — M25661 Stiffness of right knee, not elsewhere classified: Secondary | ICD-10-CM | POA: Diagnosis not present

## 2023-07-15 DIAGNOSIS — E559 Vitamin D deficiency, unspecified: Secondary | ICD-10-CM | POA: Insufficient documentation

## 2023-07-15 DIAGNOSIS — J209 Acute bronchitis, unspecified: Secondary | ICD-10-CM | POA: Insufficient documentation

## 2023-07-15 NOTE — Assessment & Plan Note (Signed)
Controlled, no change in medication DASH diet and commitment to daily physical activity for a minimum of 30 minutes discussed and encouraged, as a part of hypertension management. The importance of attaining a healthy weight is also discussed.     07/02/2023    1:07 PM 06/18/2023    1:07 PM 05/25/2023    1:00 PM 05/25/2023   12:45 PM 05/25/2023   12:30 PM 05/25/2023   12:15 PM 05/25/2023   12:01 PM  BP/Weight  Systolic BP 144 116 137 135 127 139 130  Diastolic BP 84 80 86 84 86 91 87  Wt. (Lbs) 254 243.08       BMI 39.78 kg/m2 38.07 kg/m2

## 2023-07-15 NOTE — Assessment & Plan Note (Signed)
Z pack and symptomatic treatment to continue

## 2023-07-15 NOTE — Assessment & Plan Note (Signed)
  Patient re-educated about  the importance of commitment to a  minimum of 150 minutes of exercise per week as able.  The importance of healthy food choices with portion control discussed, as well as eating regularly and within a 12 hour window most days. The need to choose "clean , green" food 50 to 75% of the time is discussed, as well as to make water the primary drink and set a goal of 64 ounces water daily.       07/02/2023    1:07 PM 06/18/2023    1:07 PM 05/25/2023    6:25 AM  Weight /BMI  Weight 254 lb 243 lb 1.3 oz 240 lb  Height 5\' 7"  (1.702 m) 5\' 7"  (1.702 m) 5\' 7"  (1.702 m)  BMI 39.78 kg/m2 38.07 kg/m2 37.59 kg/m2    Deteriorated, resume half phentermine daily Feb 1

## 2023-07-15 NOTE — Assessment & Plan Note (Signed)
Z pack prescribed

## 2023-07-15 NOTE — Assessment & Plan Note (Signed)
Updated lab needed at/ before next visit.

## 2023-07-15 NOTE — Assessment & Plan Note (Signed)
Z pack and symptomatic treatment

## 2023-07-15 NOTE — Assessment & Plan Note (Signed)
Patient educated about the importance of limiting  Carbohydrate intake , the need to commit to daily physical activity for a minimum of 30 minutes , and to commit weight loss. The fact that changes in all these areas will reduce or eliminate all together the development of diabetes is stressed.      Latest Ref Rng & Units 06/18/2023    1:50 PM 05/20/2023   10:35 AM 01/27/2023    2:14 PM 11/27/2022    9:19 AM 06/17/2022   10:26 AM  Diabetic Labs  HbA1c 4.8 - 5.6 % 5.6    5.8  5.7   Chol 100 - 199 mg/dL 161     096   HDL >04 mg/dL 44     55   Calc LDL 0 - 99 mg/dL 540     981   Triglycerides 0 - 149 mg/dL 74     67   Creatinine 0.44 - 1.00 mg/dL  1.91  4.78  2.95  6.21       07/02/2023    1:07 PM 06/18/2023    1:07 PM 05/25/2023    1:00 PM 05/25/2023   12:45 PM 05/25/2023   12:30 PM 05/25/2023   12:15 PM 05/25/2023   12:01 PM  BP/Weight  Systolic BP 144 116 137 135 127 139 130  Diastolic BP 84 80 86 84 86 91 87  Wt. (Lbs) 254 243.08       BMI 39.78 kg/m2 38.07 kg/m2            No data to display          Updated lab needed at/ before next visit.

## 2023-07-16 DIAGNOSIS — M25561 Pain in right knee: Secondary | ICD-10-CM | POA: Diagnosis not present

## 2023-07-16 DIAGNOSIS — M25661 Stiffness of right knee, not elsewhere classified: Secondary | ICD-10-CM | POA: Diagnosis not present

## 2023-07-21 DIAGNOSIS — M25561 Pain in right knee: Secondary | ICD-10-CM | POA: Diagnosis not present

## 2023-07-21 DIAGNOSIS — M25661 Stiffness of right knee, not elsewhere classified: Secondary | ICD-10-CM | POA: Diagnosis not present

## 2023-08-17 ENCOUNTER — Other Ambulatory Visit: Payer: Self-pay | Admitting: Family Medicine

## 2023-09-02 ENCOUNTER — Other Ambulatory Visit: Payer: Self-pay | Admitting: Family Medicine

## 2023-09-02 NOTE — Telephone Encounter (Signed)
 Copied from CRM [id number]. Topic: Clinical - Medication Refill >> Sep 02, 2023  2:38 PM Higinio Roger wrote: Most Recent Primary Care Visit:  Provider: Ricardo Jericho R  Department: [facility]  Visit Type: CLINICAL SUPPORT  Date: 07/02/2023  Medication: phentermine (ADIPEX-P) 37.5 MG tablet   Has the patient contacted their pharmacy? Yes (Agent: If no, request that the patient contact the pharmacy for the refill. If patient does not wish to contact the pharmacy document the reason why and proceed with request.) (Agent: If yes, when and what did the pharmacy advise?)  Is this the correct pharmacy for this prescription? Yes If no, delete pharmacy and type the correct one.  This is the patient's preferred pharmacy:  [facility] [address], [place] - [address] [place] [address] [place] [address] Phone: [phone] Fax: [phone]     Has the prescription been filled recently? Yes  Is the patient out of the medication? Yes  Has the patient been seen for an appointment in the last year OR does the patient have an upcoming appointment? Yes  Can we respond through MyChart? Yes  Agent: Please be advised that Rx refills may take up to 3 business days. We ask that you follow-up with your pharmacy.

## 2023-09-07 ENCOUNTER — Telehealth: Payer: Self-pay

## 2023-09-07 NOTE — Telephone Encounter (Signed)
 Copied from CRM [id number]. Topic: Clinical - Medication Question >> Sep 07, 2023  8:32 AM Carlatta H wrote: Reason for CRM: Please call patient to advised about phentermine (ADIPEX-P) 37.5 MG tablet [[id number]]//She stated she was in the office in Jan and Feb but she does not have any refills left//

## 2023-09-08 NOTE — Telephone Encounter (Signed)
 Lvm appt scheduled  Reminder in mail

## 2023-09-09 ENCOUNTER — Telehealth: Payer: Self-pay

## 2023-09-09 NOTE — Telephone Encounter (Signed)
 Copied from CRM [id number]. Topic: Appointments - Appointment Cancel/Reschedule >> Sep 08, 2023  3:58 PM Pierre Bali B wrote: Patient/patient representative is calling to cancel or reschedule an appointment. Refer to attachments for appointment information. Pt cancelled June 10th appt and stated she will call back when she can reschedule

## 2023-11-10 ENCOUNTER — Ambulatory Visit: Admitting: Family Medicine

## 2023-11-17 ENCOUNTER — Other Ambulatory Visit: Payer: Self-pay | Admitting: Family Medicine

## 2023-11-17 DIAGNOSIS — I10 Essential (primary) hypertension: Secondary | ICD-10-CM

## 2023-12-14 ENCOUNTER — Other Ambulatory Visit: Payer: Self-pay | Admitting: Family Medicine

## 2023-12-14 DIAGNOSIS — I10 Essential (primary) hypertension: Secondary | ICD-10-CM

## 2023-12-24 ENCOUNTER — Telehealth: Payer: Self-pay | Admitting: Pharmacist

## 2023-12-24 ENCOUNTER — Other Ambulatory Visit: Payer: Self-pay | Admitting: Family Medicine

## 2023-12-24 DIAGNOSIS — I10 Essential (primary) hypertension: Secondary | ICD-10-CM

## 2023-12-24 NOTE — Progress Notes (Signed)
 Pharmacy Quality Measure Review  This patient is appearing on a report for being at risk of failing the adherence measure for hypertension (ACEi/ARB) medications this calendar year.   Medication: lisinopril  20 mg Last fill date: 11/17/23 for 30 day supply - patient is out of refills. Last refill request was refused as patient is overdue for follow up with Dr. Antonetta.  Left voicemail for patient to return my call at their convenience. and MyChart message sent to patient. Patient needs follow up scheduled with Dr. Antonetta.   Catie [facility], PharmD, [facility] Clinical Pharmacist [phone]

## 2023-12-28 NOTE — Addendum Note (Signed)
 Addended by: RUDY CRAVEN T on: 12/28/2023 03:19 PM   Modules accepted: Orders

## 2023-12-29 ENCOUNTER — Other Ambulatory Visit: Payer: Self-pay | Admitting: Family Medicine

## 2023-12-29 DIAGNOSIS — I10 Essential (primary) hypertension: Secondary | ICD-10-CM

## 2024-01-04 ENCOUNTER — Other Ambulatory Visit (HOSPITAL_COMMUNITY): Payer: Self-pay | Admitting: Family Medicine

## 2024-01-04 DIAGNOSIS — Z1231 Encounter for screening mammogram for malignant neoplasm of breast: Secondary | ICD-10-CM

## 2024-01-08 ENCOUNTER — Ambulatory Visit (INDEPENDENT_AMBULATORY_CARE_PROVIDER_SITE_OTHER): Admitting: Family Medicine

## 2024-01-08 ENCOUNTER — Encounter: Payer: Self-pay | Admitting: Family Medicine

## 2024-01-08 VITALS — BP 180/98 | HR 75 | Resp 16 | Ht 67.0 in | Wt 261.0 lb

## 2024-01-08 DIAGNOSIS — I10 Essential (primary) hypertension: Secondary | ICD-10-CM

## 2024-01-08 DIAGNOSIS — R300 Dysuria: Secondary | ICD-10-CM

## 2024-01-08 DIAGNOSIS — Z13220 Encounter for screening for lipoid disorders: Secondary | ICD-10-CM

## 2024-01-08 DIAGNOSIS — R7303 Prediabetes: Secondary | ICD-10-CM | POA: Diagnosis not present

## 2024-01-08 DIAGNOSIS — E785 Hyperlipidemia, unspecified: Secondary | ICD-10-CM | POA: Diagnosis not present

## 2024-01-08 DIAGNOSIS — Z0001 Encounter for general adult medical examination with abnormal findings: Secondary | ICD-10-CM

## 2024-01-08 MED ORDER — OLMESARTAN MEDOXOMIL 40 MG PO TABS: 40.0000 mg | ORAL_TABLET | Freq: Every day | ORAL | 1 refills | Status: DC

## 2024-01-08 NOTE — Patient Instructions (Addendum)
 Nurse BP check in 2 weeks   F/U with MD in 3 to 4  months  CCUA and C/S reflex today   New medication for blood pressure is olmesartan  40 mg one daily, stop lisinopril , blood pressure is very high  Eat as we discussed for improved blood pressre, health and weight loss  Lipid, cmp and EGFr and HBa1C today   Please schedule AWV at check out

## 2024-01-09 LAB — CMP14+EGFR
ALT: 14 IU/L (ref 0–32)
AST: 25 IU/L (ref 0–40)
Albumin: 4.2 g/dL (ref 3.8–4.9)
Alkaline Phosphatase: 95 IU/L (ref 44–121)
BUN/Creatinine Ratio: 10 (ref 9–23)
BUN: 9 mg/dL (ref 6–24)
Bilirubin Total: 0.5 mg/dL (ref 0.0–1.2)
CO2: 24 mmol/L (ref 20–29)
Calcium: 10.2 mg/dL (ref 8.7–10.2)
Chloride: 102 mmol/L (ref 96–106)
Creatinine, Ser: 0.89 mg/dL (ref 0.57–1.00)
Globulin, Total: 3 g/dL (ref 1.5–4.5)
Glucose: 85 mg/dL (ref 70–99)
Potassium: 4.4 mmol/L (ref 3.5–5.2)
Sodium: 140 mmol/L (ref 134–144)
Total Protein: 7.2 g/dL (ref 6.0–8.5)
eGFR: 75 mL/min/1.73 (ref >59)

## 2024-01-09 LAB — HEMOGLOBIN A1C
Est. average glucose Bld gHb Est-mCnc: 114 mg/dL
Hgb A1c MFr Bld: 5.6 % (ref 4.8–5.6)

## 2024-01-09 LAB — UA/M W/RFLX CULTURE, ROUTINE
Bilirubin, UA: NEGATIVE
Glucose, UA: NEGATIVE
Ketones, UA: NEGATIVE
Leukocytes,UA: NEGATIVE
Nitrite, UA: NEGATIVE
Protein,UA: NEGATIVE
RBC, UA: NEGATIVE
Specific Gravity, UA: 1.015 (ref 1.005–1.030)
Urobilinogen, Ur: 0.2 mg/dL (ref 0.2–1.0)
pH, UA: 8 — ABNORMAL HIGH (ref 5.0–7.5)

## 2024-01-09 LAB — LIPID PANEL
Chol/HDL Ratio: 3 ratio (ref 0.0–4.4)
Cholesterol, Total: 185 mg/dL (ref 100–199)
HDL: 61 mg/dL (ref >39)
LDL Chol Calc (NIH): 115 mg/dL — ABNORMAL HIGH (ref 0–99)
Triglycerides: 46 mg/dL (ref 0–149)
VLDL Cholesterol Cal: 9 mg/dL (ref 5–40)

## 2024-01-09 LAB — MICROSCOPIC EXAMINATION
Bacteria, UA: NONE SEEN
Casts: NONE SEEN /LPF
RBC, Urine: NONE SEEN /HPF (ref 0–2)
WBC, UA: NONE SEEN /HPF (ref 0–5)

## 2024-01-10 ENCOUNTER — Ambulatory Visit: Payer: Self-pay | Admitting: Family Medicine

## 2024-01-10 ENCOUNTER — Encounter: Payer: Self-pay | Admitting: Family Medicine

## 2024-01-10 DIAGNOSIS — Z13220 Encounter for screening for lipoid disorders: Secondary | ICD-10-CM | POA: Insufficient documentation

## 2024-01-10 DIAGNOSIS — R300 Dysuria: Secondary | ICD-10-CM | POA: Insufficient documentation

## 2024-01-10 DIAGNOSIS — E785 Hyperlipidemia, unspecified: Secondary | ICD-10-CM | POA: Insufficient documentation

## 2024-01-10 NOTE — Assessment & Plan Note (Signed)
 Hyperlipidemia:Low fat diet discussed and encouraged.   Lipid Panel  Lab Results  Component Value Date   CHOL 185 01/08/2024   HDL 61 01/08/2024   LDLCALC 115 (H) 01/08/2024   TRIG 46 01/08/2024   CHOLHDL 3.0 01/08/2024     Deteriorated , needs to reduce fried and fatty foods

## 2024-01-10 NOTE — Progress Notes (Signed)
 Suzanne Andrews     MRN: [id number]      DOB: 17-Nov-1965  Chief Complaint  Patient presents with   Annual Exam    cpe   Nausea    Pt states she experienced nausea and mild side pain last week. She went to get her shingles shot but they would not give it to her due to feeling that way.     HPI: Patient is in for annual physical exam. Uncontrolled HYN, dysuria and obesity are addressedRecent labs,  are reviewed. Immunization is reviewed , and  updated if needed.   PE: BP (!) 180/98   Pulse 75   Resp 16   Ht 5' 7 (1.702 m)   Wt 261 lb (118.4 kg)   LMP 08/12/2014   SpO2 95%   BMI 40.88 kg/m   Pleasant  female, alert and oriented x 3, Afebrile. HEENT No facial trauma or asymetry. Sinuses non tender.  Extra occullar muscles intact.. External ears normal, . Neck: supple, no adenopathy,JVD or thyromegaly.No bruits.  Chest: Clear to ascultation bilaterally.No crackles or wheezes. Non tender to palpation  Breast: Not examined Cardiovascular system; Heart sounds normal,  S1 and  S2 ,no S3.  No murmur, or thrill. Apical beat not displaced Peripheral pulses normal.  Abdomen: Soft, non tender, no organomegaly or masses. No bruits. Bowel sounds normal. No guarding, tenderness or rebound.    Musculoskeletal exam: Decreased  ROM of spine, hips , shoulders and normal in  knees.  deformity ,noted. No muscle wasting or atrophy.   Neurologic: Cranial nerves 2 to 12 intact. Power, tone ,sensation and reflexes normal throughout. No disturbance in gait. No tremor.  Skin: Intact, no ulceration, erythema , scaling or rash noted. hyper Pigmentation of lower extremitiesPsych; Normal mood and affect. Judgement and concentration normal   Assessment & Plan:  Annual visit for general adult medical examination with abnormal findings Annual exam as documented. Counseling done  re healthy lifestyle involving commitment to 150 minutes exercise per week, heart healthy diet,  and attaining healthy weight.The importance of adequate sleep also discussed. Regular seat belt use and home safety, is also discussed. Changes in health habits are decided on by the patient with goals and time frames  set for achieving them. Immunization and cancer screening needs are specifically addressed at this visit.   Dysuria 3 day h/o mild dysuria and flank pain 1 week ao, CCUA and c/s if abn  Dyslipidemia Hyperlipidemia:Low fat diet discussed and encouraged.   Lipid Panel  Lab Results  Component Value Date   CHOL 185 01/08/2024   HDL 61 01/08/2024   LDLCALC 115 (H) 01/08/2024   TRIG 46 01/08/2024   CHOLHDL 3.0 01/08/2024     Deteriorated , needs to reduce fried and fatty foods  Prediabetes Patient educated about the importance of limiting  Carbohydrate intake , the need to commit to daily physical activity for a minimum of 30 minutes , and to commit weight loss. The fact that changes in all these areas will reduce or eliminate all together the development of diabetes is stressed.      Latest Ref Rng & Units 01/08/2024   11:05 AM 06/18/2023    1:50 PM 05/20/2023   10:35 AM 01/27/2023    2:14 PM 11/27/2022    9:19 AM  Diabetic Labs  HbA1c 4.8 - 5.6 % 5.6  5.6    5.8   Chol 100 - 199 mg/dL 814  832      HDL >  39 mg/dL 61  44      Calc LDL 0 - 99 mg/dL 884  890      Triglycerides 0 - 149 mg/dL 46  74      Creatinine 0.57 - 1.00 mg/dL 9.10   9.20  9.14  9.07       01/08/2024   10:33 AM 01/08/2024   10:08 AM 07/02/2023    1:07 PM 06/18/2023    1:07 PM 05/25/2023    1:00 PM 05/25/2023   12:45 PM 05/25/2023   12:30 PM  BP/Weight  Systolic BP 180 170 144 116 137 135 127  Diastolic BP 98 98 84 80 86 84 86  Wt. (Lbs)  261 254 243.08     BMI  40.88 kg/m2 39.78 kg/m2 38.07 kg/m2          No data to display          Improved/ corrected  Essential hypertension DASH diet and commitment to daily physical activity for a minimum of 30 minutes discussed and encouraged,  as a part of hypertension management. The importance of attaining a healthy weight is also discussed.     01/08/2024   10:33 AM 01/08/2024   10:08 AM 07/02/2023    1:07 PM 06/18/2023    1:07 PM 05/25/2023    1:00 PM 05/25/2023   12:45 PM 05/25/2023   12:30 PM  BP/Weight  Systolic BP 180 170 144 116 137 135 127  Diastolic BP 98 98 84 80 86 84 86  Wt. (Lbs)  261 254 243.08     BMI  40.88 kg/m2 39.78 kg/m2 38.07 kg/m2        Uncontrolled , inc olmesartan  to 40 mg dose , nurse BP check in 2 to 3 weeks prior to sarting phentermine  for weight loss  Morbid obesity (HCC)  Patient re-educated about  the importance of commitment to a  minimum of 150 minutes of exercise per week as able.  The importance of healthy food choices with portion control discussed, as well as eating regularly and within a 12 hour window most days. The need to choose clean , green food 50 to 75% of the time is discussed, as well as to make water  the primary drink and set a goal of 64 ounces water  daily.       01/08/2024   10:08 AM 07/02/2023    1:07 PM 06/18/2023    1:07 PM  Weight /BMI  Weight 261 lb 254 lb 243 lb 1.3 oz  Height 5' 7 (1.702 m) 5' 7 (1.702 m) 5' 7 (1.702 m)  BMI 40.88 kg/m2 39.78 kg/m2 38.07 kg/m2    Deteriorated , will resume phentermine  once BP controlled

## 2024-01-10 NOTE — Assessment & Plan Note (Signed)
 3 day h/o mild dysuria and flank pain 1 week ao, CCUA and c/s if abn

## 2024-01-10 NOTE — Assessment & Plan Note (Signed)
  Patient re-educated about  the importance of commitment to a  minimum of 150 minutes of exercise per week as able.  The importance of healthy food choices with portion control discussed, as well as eating regularly and within a 12 hour window most days. The need to choose clean , green food 50 to 75% of the time is discussed, as well as to make water  the primary drink and set a goal of 64 ounces water  daily.       01/08/2024   10:08 AM 07/02/2023    1:07 PM 06/18/2023    1:07 PM  Weight /BMI  Weight 261 lb 254 lb 243 lb 1.3 oz  Height 5' 7 (1.702 m) 5' 7 (1.702 m) 5' 7 (1.702 m)  BMI 40.88 kg/m2 39.78 kg/m2 38.07 kg/m2    Deteriorated , will resume phentermine  once BP controlled

## 2024-01-10 NOTE — Assessment & Plan Note (Signed)
 Patient educated about the importance of limiting  Carbohydrate intake , the need to commit to daily physical activity for a minimum of 30 minutes , and to commit weight loss. The fact that changes in all these areas will reduce or eliminate all together the development of diabetes is stressed.      Latest Ref Rng & Units 01/08/2024   11:05 AM 06/18/2023    1:50 PM 05/20/2023   10:35 AM 01/27/2023    2:14 PM 11/27/2022    9:19 AM  Diabetic Labs  HbA1c 4.8 - 5.6 % 5.6  5.6    5.8   Chol 100 - 199 mg/dL 814  832      HDL >60 mg/dL 61  44      Calc LDL 0 - 99 mg/dL 884  890      Triglycerides 0 - 149 mg/dL 46  74      Creatinine 0.57 - 1.00 mg/dL 9.10   9.20  9.14  9.07       01/08/2024   10:33 AM 01/08/2024   10:08 AM 07/02/2023    1:07 PM 06/18/2023    1:07 PM 05/25/2023    1:00 PM 05/25/2023   12:45 PM 05/25/2023   12:30 PM  BP/Weight  Systolic BP 180 170 144 116 137 135 127  Diastolic BP 98 98 84 80 86 84 86  Wt. (Lbs)  261 254 243.08     BMI  40.88 kg/m2 39.78 kg/m2 38.07 kg/m2          No data to display          Improved/ corrected

## 2024-01-10 NOTE — Assessment & Plan Note (Signed)

## 2024-01-10 NOTE — Assessment & Plan Note (Signed)
 DASH diet and commitment to daily physical activity for a minimum of 30 minutes discussed and encouraged, as a part of hypertension management. The importance of attaining a healthy weight is also discussed.     01/08/2024   10:33 AM 01/08/2024   10:08 AM 07/02/2023    1:07 PM 06/18/2023    1:07 PM 05/25/2023    1:00 PM 05/25/2023   12:45 PM 05/25/2023   12:30 PM  BP/Weight  Systolic BP 180 170 144 116 137 135 127  Diastolic BP 98 98 84 80 86 84 86  Wt. (Lbs)  261 254 243.08     BMI  40.88 kg/m2 39.78 kg/m2 38.07 kg/m2        Uncontrolled , inc olmesartan  to 40 mg dose , nurse BP check in 2 to 3 weeks prior to sarting phentermine  for weight loss

## 2024-01-13 ENCOUNTER — Ambulatory Visit (HOSPITAL_COMMUNITY)
Admission: RE | Admit: 2024-01-13 | Discharge: 2024-01-13 | Disposition: A | Discharge status: Home / Self Care | Source: Ambulatory Visit | Attending: Family Medicine | Admitting: Family Medicine

## 2024-01-13 DIAGNOSIS — Z1231 Encounter for screening mammogram for malignant neoplasm of breast: Secondary | ICD-10-CM | POA: Insufficient documentation

## 2024-01-22 ENCOUNTER — Ambulatory Visit (INDEPENDENT_AMBULATORY_CARE_PROVIDER_SITE_OTHER)

## 2024-01-22 ENCOUNTER — Ambulatory Visit

## 2024-01-22 VITALS — BP 164/94

## 2024-01-22 DIAGNOSIS — I10 Essential (primary) hypertension: Secondary | ICD-10-CM | POA: Diagnosis not present

## 2024-01-22 MED ORDER — OLMESARTAN MEDOXOMIL 40 MG PO TABS: 40.0000 mg | ORAL_TABLET | Freq: Every day | ORAL | 3 refills | Status: AC

## 2024-01-22 MED ORDER — SPIRONOLACTONE 25 MG PO TABS
25.0000 mg | ORAL_TABLET | Freq: Every day | ORAL | 3 refills | Status: AC
Start: 2024-01-22 — End: ?

## 2024-01-22 NOTE — Progress Notes (Signed)
 Patient is in office today for a nurse visit for Blood Pressure Check. Patient blood pressure was 160/102 then 164/94, Patient No chest pain, No shortness of breath, No dyspnea on exertion

## 2024-01-22 NOTE — Addendum Note (Signed)
 Addended by: ANTONETTA ROLLENE BRAVO on: 01/22/2024 05:46 PM   Modules accepted: Orders

## 2024-01-22 NOTE — Progress Notes (Signed)
 Pt aware that additional new med added spironolactone  25 mg daily, she is to comtimue olmesartan  40 mg daily , will arrange MD visit in Novemebr with previsit bmp and EGFR

## 2024-01-25 ENCOUNTER — Other Ambulatory Visit: Payer: Self-pay | Admitting: Family Medicine

## 2024-01-25 DIAGNOSIS — I10 Essential (primary) hypertension: Secondary | ICD-10-CM

## 2024-04-08 ENCOUNTER — Ambulatory Visit: Admitting: Internal Medicine

## 2024-04-29 NOTE — Progress Notes (Addendum)
 QUINNE PIRES                                          MRN: [id number]   04/29/2024   The [facility] Quality Team Specialist reviewed this patient medical record for the purposes of chart review for care gap closure. The following were reviewed: chart review for care gap closure-diabetic eye exam and kidney health evaluation for diabetes:eGFR  and uACR.    [facility] Quality Team

## 2024-05-31 ENCOUNTER — Ambulatory Visit

## 2024-05-31 ENCOUNTER — Other Ambulatory Visit: Payer: Self-pay

## 2024-05-31 VITALS — Ht 67.0 in | Wt 257.0 lb

## 2024-05-31 DIAGNOSIS — Z0000 Encounter for general adult medical examination without abnormal findings: Secondary | ICD-10-CM

## 2024-05-31 DIAGNOSIS — I10 Essential (primary) hypertension: Secondary | ICD-10-CM

## 2024-05-31 MED ORDER — BLOOD PRESSURE MONITOR 3 DEVI: 0 refills | Status: AC

## 2024-05-31 NOTE — Patient Instructions (Signed)
 Suzanne Andrews,  Thank you for taking the time for your Medicare Wellness Visit. I appreciate your continued commitment to your health goals. Please review the care plan we discussed, and feel free to reach out if I can assist you further.  Please note that Annual Wellness Visits do not include a physical exam. Some assessments may be limited, especially if the visit was conducted virtually. If needed, we may recommend an in-person follow-up with your provider.  Ongoing Care Seeing your primary care provider every 3 to 6 months helps us  monitor your health and provide consistent, personalized care.   1 year follow up for Medicare well visit: Monday June 05, 2025 at 8:40 am with medicare wellness nurse in office  Recommended Screenings:  Health Maintenance  Topic Date Due   Hepatitis B Vaccine (1 of 3 - 19+ 3-dose series) Never done   Pneumococcal Vaccine for age over 32 (1 of 1 - PCV) Never done   Zoster (Shingles) Vaccine (1 of 2) Never done   DTaP/Tdap/Td vaccine (2 - Tdap) 11/11/2017   Medicare Annual Wellness Visit  06/12/2023   COVID-19 Vaccine (4 - 2025-26 season) 02/01/2024   Breast Cancer Screening  01/12/2025   Cologuard (Stool DNA test)  12/15/2025   Pap with HPV screening  11/27/2027   Flu Shot  Completed   Hepatitis C Screening  Completed   HIV Screening  Completed   HPV Vaccine  Aged Out   Meningitis B Vaccine  Aged Out       05/31/2024    8:54 AM  Advanced Directives  Does Patient Have a Medical Advance Directive? No  Would patient like information on creating a medical advance directive? Yes (MAU/Ambulatory/Procedural Areas - Information given)    Vision: Annual vision screenings are recommended for early detection of glaucoma, cataracts, and diabetic retinopathy. These exams can also reveal signs of chronic conditions such as diabetes and high blood pressure.  Dental: Annual dental screenings help detect early signs of oral cancer, gum disease, and other  conditions linked to overall health, including heart disease and diabetes.  Please see the attached documents for additional preventive care recommendations.

## 2024-05-31 NOTE — Progress Notes (Signed)
 "  No voiced or noted concerns at this time Chief Complaint  Patient presents with   Medicare Wellness     Subjective:   Suzanne Andrews is a 58 y.o. female who presents for a Medicare Annual Wellness Visit.  Visit info / Clinical Intake: Medicare Wellness Visit Type:: Subsequent Annual Wellness Visit Persons participating in visit and providing information:: patient Medicare Wellness Visit Mode:: Telephone If telephone:: video error Since this visit was completed virtually, some vitals may be partially provided or unavailable. Missing vitals are due to the limitations of the virtual format.: Documented vitals are patient reported If Telephone or Video please confirm:: I connected with patient using audio/video enable telemedicine. I verified patient identity with two identifiers, discussed telehealth limitations, and patient agreed to proceed. Patient Location:: home Provider Location:: office Interpreter Needed?: No Pre-visit prep was completed: yes AWV questionnaire completed by patient prior to visit?: no Living arrangements:: with family/others Patient's Overall Health Status Rating: very good Typical amount of pain: none Does pain affect daily life?: no Are you currently prescribed opioids?: no  Dietary Habits and Nutritional Risks How many meals a day?: 2 Eats fruit and vegetables daily?: yes Most meals are obtained by: preparing own meals In the last 2 weeks, have you had any of the following?: none Diabetic:: no  Functional Status Activities of Daily Living (to include ambulation/medication): Independent Ambulation: Independent Medication Administration: Independent Home Management (perform basic housework or laundry): Independent Manage your own finances?: yes Primary transportation is: driving Concerns about vision?: no *vision screening is required for WTM* Concerns about hearing?: no  Fall Screening Falls in the past year?: 0 Number of falls in past year:  0 Was there an injury with Fall?: 0 Fall Risk Category Calculator: 0 Patient Fall Risk Level: Low Fall Risk  Fall Risk Patient at Risk for Falls Due to: No Fall Risks Fall risk Follow up: Falls evaluation completed; Education provided; Falls prevention discussed  Home and Transportation Safety: All rugs have non-skid backing?: N/A, no rugs All stairs or steps have railings?: yes Grab bars in the bathtub or shower?: (!) no Have non-skid surface in bathtub or shower?: yes Good home lighting?: yes Regular seat belt use?: yes Hospital stays in the last year:: no  Cognitive Assessment Difficulty concentrating, remembering, or making decisions? : no Will 6CIT or Mini Cog be Completed: yes What year is it?: 0 points What month is it?: 0 points Give patient an address phrase to remember (5 components): [address] [place] About what time is it?: 0 points Count backwards from 20 to 1: 0 points Say the months of the year in reverse: 0 points Repeat the address phrase from earlier: 0 points 6 CIT Score: 0 points  Advance Directives (For Healthcare) Does Patient Have a Medical Advance Directive?: No Would patient like information on creating a medical advance directive?: Yes (MAU/Ambulatory/Procedural Areas - Information given)  Reviewed/Updated  Reviewed/Updated: Reviewed All (Medical, Surgical, Family, Medications, Allergies, Care Teams, Patient Goals)    Allergies (verified) Septra  [sulfamethoxazole -trimethoprim ], Bentyl  [dicyclomine ], Latex, and Zofran  [ondansetron ]   Current Medications (verified) Outpatient Encounter Medications as of 05/31/2024  Medication Sig   loratadine (CLARITIN) 10 MG tablet Take 10 mg by mouth daily as needed for allergies.   Multiple Vitamin (MULTIVITAMIN WITH MINERALS) TABS tablet Take 1 tablet by mouth daily.   olmesartan  (BENICAR ) 40 MG tablet Take 1 tablet (40 mg total) by mouth daily.   spironolactone  (ALDACTONE ) 25 MG tablet Take 1 tablet  (25  mg total) by mouth daily.   No facility-administered encounter medications on file as of 05/31/2024.    History: Past Medical History:  Diagnosis Date   Arthritis    Hypertension    PVD (peripheral vascular disease)    Past Surgical History:  Procedure Laterality Date   CESAREAN SECTION     x 2   CHOLECYSTECTOMY     KNEE ARTHROSCOPY WITH MEDIAL MENISECTOMY Left 07/18/2014   Procedure: LEFT KNEE ARTHROSCOPY WITH MEDIAL AND LATERAL MENISECTOMY;  Surgeon: Lemond Stable, MD;  Location: [facility];  Service: Orthopedics;  Laterality: Left;   TOTAL KNEE ARTHROPLASTY Left 02/09/2023   Procedure: TOTAL KNEE ARTHROPLASTY;  Surgeon: Melodi Lerner, MD;  Location: [facility];  Service: Orthopedics;  Laterality: Left;   TOTAL KNEE ARTHROPLASTY Right 05/25/2023   Procedure: TOTAL KNEE ARTHROPLASTY;  Surgeon: Melodi Lerner, MD;  Location: [facility];  Service: Orthopedics;  Laterality: Right;   TUBAL LIGATION     vascular surgery bilateral leg     Family History  Problem Relation Age of Onset   Hypertension Mother    Cancer Father    Breast cancer Sister    Hypertension Sister    Cancer Sister        breast   Cancer Brother    Hypertension Brother    Cancer Brother    Social History   Occupational History   Not on file  Tobacco Use   Smoking status: Never   Smokeless tobacco: Never  Vaping Use   Vaping status: Never Used  Substance and Sexual Activity   Alcohol use: No   Drug use: No   Sexual activity: Yes    Birth control/protection: Surgical   Tobacco Counseling Counseling given: Yes  SDOH Screenings   Food Insecurity: No Food Insecurity (05/31/2024)  Housing: Low Risk (05/31/2024)  Transportation Needs: No Transportation Needs (05/31/2024)  Utilities: Not At Risk (05/31/2024)  Alcohol Screen: Low Risk (06/11/2022)  Depression (PHQ2-9): Low Risk (05/31/2024)  Financial Resource Strain: Low Risk (06/11/2022)  Physical Activity: Sufficiently Active (05/31/2024)  Social  Connections: Moderately Integrated (05/31/2024)  Stress: No Stress Concern Present (05/31/2024)  Tobacco Use: Low Risk (05/31/2024)  Health Literacy: Adequate Health Literacy (05/31/2024)   See flowsheets for full screening details  Depression Screen PHQ 2 & 9 Depression Scale- Over the past 2 weeks, how often have you been bothered by any of the following problems? Little interest or pleasure in doing things: 0 Feeling down, depressed, or hopeless (PHQ Adolescent also includes...irritable): 0 PHQ-2 Total Score: 0 Trouble falling or staying asleep, or sleeping too much: 0 Feeling tired or having little energy: 0 Poor appetite or overeating (PHQ Adolescent also includes...weight loss): 0 Feeling bad about yourself - or that you are a failure or have let yourself or your family down: 0 Trouble concentrating on things, such as reading the newspaper or watching television (PHQ Adolescent also includes...like school work): 0 Moving or speaking so slowly that other people could have noticed. Or the opposite - being so fidgety or restless that you have been moving around a lot more than usual: 0 Thoughts that you would be better off dead, or of hurting yourself in some way: 0 PHQ-9 Total Score: 0  Depression Treatment Depression Interventions/Treatment : EYV7-0 Score <4 Follow-up Not Indicated     Goals Addressed               This Visit's Progress     I want to improve my health (pt-stated)  Objective:    Today's Vitals   05/31/24 0845  Weight: 257 lb (116.6 kg)  Height: 5' 7 (1.702 m)   Body mass index is 40.25 kg/m.  Hearing/Vision screen Hearing Screening - Comments:: Patient denies any hearing difficulties.   Vision Screening - Comments:: Patient does not have an eye doctor. A list of eye doctors has been provided to the patient.   Immunizations and Health Maintenance Health Maintenance  Topic Date Due   Hepatitis B Vaccines 19-59 Average Risk (1  of 3 - 19+ 3-dose series) Never done   Pneumococcal Vaccine: 50+ Years (1 of 1 - PCV) Never done   Zoster Vaccines- Shingrix (1 of 2) Never done   DTaP/Tdap/Td (2 - Tdap) 11/11/2017   Medicare Annual Wellness (AWV)  06/12/2023   COVID-19 Vaccine (4 - 2025-26 season) 02/01/2024   Mammogram  01/12/2025   Fecal DNA (Cologuard)  12/15/2025   Cervical Cancer Screening (HPV/Pap Cotest)  11/27/2027   Influenza Vaccine  Completed   Hepatitis C Screening  Completed   HIV Screening  Completed   HPV VACCINES  Aged Out   Meningococcal B Vaccine  Aged Out        Assessment/Plan:  This is a routine wellness examination for Suzanne Andrews.  Patient Care Team: Antonetta Rollene BRAVO, MD as PCP - Diedre Melodi Lerner, MD as Consulting Physician (Orthopedic Surgery) [place], Franky Fallow, MD as Referring Physician (Vascular Surgery)  I have personally reviewed and noted the following in the patients chart:   Medical and social history Use of alcohol, tobacco or illicit drugs  Current medications and supplements including opioid prescriptions. Functional ability and status Nutritional status Physical activity Advanced directives List of other physicians Hospitalizations, surgeries, and ER visits in previous 12 months Vitals Screenings to include cognitive, depression, and falls Referrals and appointments  No orders of the defined types were placed in this encounter.  In addition, I have reviewed and discussed with patient certain preventive protocols, quality metrics, and best practice recommendations. A written personalized care plan for preventive services as well as general preventive health recommendations were provided to patient.   Suzanne Andrews, CMA   05/31/2024   Return for Monday June 05, 2025 yearly medicare wellness visit w/ wellness nurse in person.  After Visit Summary: (MyChart) Due to this being a telephonic visit, the after visit summary with patients personalized plan was  offered to patient via MyChart     "

## 2024-06-17 ENCOUNTER — Ambulatory Visit: Admitting: Family Medicine

## 2024-07-05 ENCOUNTER — Encounter: Payer: Self-pay | Admitting: *Deleted

## 2024-07-05 NOTE — Progress Notes (Signed)
 Suzanne Andrews                                          MRN: [id number]   07/05/2024   The [facility] Quality Team Specialist reviewed this patient medical record for the purposes of chart review for care gap closure. The following were reviewed: chart review for care gap closure-controlling blood pressure.    [facility]BCI Quality Team

## 2024-07-29 ENCOUNTER — Ambulatory Visit: Payer: Self-pay | Admitting: Family Medicine

## 2025-06-05 ENCOUNTER — Ambulatory Visit: Payer: Self-pay
